# Patient Record
Sex: Female | Born: 1970 | Race: Black or African American | Hispanic: No | Marital: Married | Smoking: Never smoker
Health system: Southern US, Community
[De-identification: ages and names within clinical notes are randomized; demographics above are authoritative.]

## PROBLEM LIST (undated history)

## (undated) DIAGNOSIS — I2699 Other pulmonary embolism without acute cor pulmonale: Secondary | ICD-10-CM

## (undated) DIAGNOSIS — M7730 Calcaneal spur, unspecified foot: Secondary | ICD-10-CM

## (undated) HISTORY — PX: OTHER SURGICAL HISTORY: SHX169

## (undated) HISTORY — DX: Other pulmonary embolism without acute cor pulmonale: I26.99

## (undated) HISTORY — DX: Calcaneal spur, unspecified foot: M77.30

---

## 2009-06-01 ENCOUNTER — Ambulatory Visit: Payer: Self-pay | Admitting: Family Medicine

## 2009-06-01 ENCOUNTER — Other Ambulatory Visit: Admission: RE | Admit: 2009-06-01 | Discharge: 2009-06-01 | Payer: Self-pay | Admitting: Family Medicine

## 2009-06-02 ENCOUNTER — Encounter: Payer: Self-pay | Admitting: Family Medicine

## 2009-06-02 ENCOUNTER — Telehealth (INDEPENDENT_AMBULATORY_CARE_PROVIDER_SITE_OTHER): Payer: Self-pay | Admitting: *Deleted

## 2009-06-02 LAB — CONVERTED CEMR LAB
Trich, Wet Prep: NONE SEEN
WBC, Wet Prep HPF POC: NONE SEEN

## 2009-06-26 ENCOUNTER — Encounter: Payer: Self-pay | Admitting: Family Medicine

## 2009-06-28 LAB — CONVERTED CEMR LAB
CO2: 23 meq/L (ref 19–32)
Calcium: 9 mg/dL (ref 8.4–10.5)
Chloride: 106 meq/L (ref 96–112)
Cholesterol: 170 mg/dL (ref 0–200)
Glucose, Bld: 87 mg/dL (ref 70–99)
HCT: 38 % (ref 36.0–46.0)
MCV: 98.4 fL (ref 78.0–100.0)
RBC: 3.86 M/uL — ABNORMAL LOW (ref 3.87–5.11)
Sodium: 139 meq/L (ref 135–145)
Total Bilirubin: 0.5 mg/dL (ref 0.3–1.2)
Total Protein: 7 g/dL (ref 6.0–8.3)
Triglycerides: 67 mg/dL (ref <150)
VLDL: 13 mg/dL (ref 0–40)
WBC: 4.5 10*3/uL (ref 4.0–10.5)

## 2010-03-16 ENCOUNTER — Encounter: Admission: RE | Admit: 2010-03-16 | Discharge: 2010-03-16 | Payer: Self-pay | Admitting: Family Medicine

## 2010-03-16 ENCOUNTER — Ambulatory Visit: Payer: Self-pay | Admitting: Family Medicine

## 2010-03-16 ENCOUNTER — Telehealth: Payer: Self-pay | Admitting: Family Medicine

## 2010-03-16 DIAGNOSIS — M79609 Pain in unspecified limb: Secondary | ICD-10-CM

## 2010-03-16 DIAGNOSIS — M25569 Pain in unspecified knee: Secondary | ICD-10-CM | POA: Insufficient documentation

## 2010-03-17 ENCOUNTER — Encounter: Payer: Self-pay | Admitting: Family Medicine

## 2010-03-21 LAB — CONVERTED CEMR LAB
Clue Cells Wet Prep HPF POC: NONE SEEN
Trich, Wet Prep: NONE SEEN
Yeast Wet Prep HPF POC: NONE SEEN

## 2010-03-29 ENCOUNTER — Telehealth: Payer: Self-pay | Admitting: Family Medicine

## 2010-04-02 ENCOUNTER — Ambulatory Visit: Payer: Self-pay | Admitting: Family Medicine

## 2010-05-25 NOTE — Progress Notes (Signed)
Summary: Rx. Question  Phone Note Call from Patient   Caller: Patient Summary of Call: Candace Greene is is at the pharmacy and her medicine did not fall under the 4$ plan at  [facility] so Candace Greene is wondering if there is something cheaper she can have? Call back number [phone] Initial call taken by: Michaelle Copas,  March 16, 2010 2:43 PM  Follow-up for Phone Call        WE can try diclofenac.  Follow-up by: Nani Gasser MD,  March 16, 2010 3:00 PM  Additional Follow-up for Phone Call Additional follow up Details #1::        Candace Greene notified Additional Follow-up by: Avon Gully CMA, Duncan Dull),  March 17, 2010 8:42 AM    New/Updated Medications: DICLOFENAC SODIUM 75 MG TBEC (DICLOFENAC SODIUM) Take 1 tablet by mouth two times a day as needed for pain Prescriptions: DICLOFENAC SODIUM 75 MG TBEC (DICLOFENAC SODIUM) Take 1 tablet by mouth two times a day as needed for pain  #60 x 0   Entered and Authorized by:   Nani Gasser MD   Signed by:   Nani Gasser MD on 03/16/2010   Method used:   Electronically to        [facility] [phone]* (retail)       [address] [place], [place]  [address]       Ph: [phone]       Fax: [phone]   RxID:   [id number]

## 2010-05-25 NOTE — Progress Notes (Signed)
Summary: Vag itcing and knee pain  Phone Note Call from Patient   Caller: Patient Call For: Nani Gasser MD Summary of Call: pt called and states she is still having some vaginal itching and still has some knee pain. please advise Initial call taken by: Avon Gully CMA, Duncan Dull),  March 29, 2010 4:52 PM  Follow-up for Phone Call        She is welcome to come back in for repeat wet prep ([facility] hasn't used OTC meds for Yeast).  For her knee we can refer to ortho since not better and the xray was ok. Let me know if wants to do ortho.  Follow-up by: Nani Gasser MD,  March 29, 2010 5:00 PM  Additional Follow-up for Phone Call Additional follow up Details #1::        pt notified and wants referral to ortho. will schedule a nv Additional Follow-up by: Avon Gully CMA, Duncan Dull),  March 30, 2010 8:57 AM

## 2010-05-25 NOTE — Progress Notes (Signed)
Summary: pt uses [facility]  Phone Note Outgoing Call   Call placed by: Kandice Hams,  June 02, 2009 12:42 PM Call placed to: Patient Summary of Call: [facility] uses [facility] does not matter gel or tablet just as long as generic  [facility] Initial call taken by: Kandice Hams,  June 02, 2009 12:43 PM  Follow-up for Phone Call        Already sent.  Follow-up by: Nani Gasser MD,  June 02, 2009 12:47 PM

## 2010-05-25 NOTE — Assessment & Plan Note (Signed)
Summary: NOV:CPE w/ pap   Vital Signs:  Patient profile:   40 year old female Weight:      186.75 pounds Temp:     98.2 degrees F oral Pulse rate:   62 / minute BP sitting:   106 / 69  Vitals Entered By: Kandice Hams (June 01, 2009 3:37 PM) CC: new pt cpx with pap   Primary Care Provider:  Nani Gasser, MD  CC:  new pt cpx with pap.  History of Present Illness: Has been years since her last pap smear. Ran out of insurance so hasn't been in a long time.  Never had an abnormal one.  Currently sexually active. +hx for STD.  Has had some weight gain over the last year.   Was on ABX about a month ago and feels has had a low grade yeast infection with d/c adn ithcing since then Tried some OTC treatment but not sure it really worked. No abdominal pain.   Habits & Providers  Alcohol-Tobacco-Diet     Alcohol drinks/day: 0     Tobacco Status: never  Exercise-Depression-Behavior     Does Patient Exercise: no     Drug Use: no  Allergies (verified): No Known Drug Allergies  Past History:  Past Medical History: Right heel spur.    Past Surgical History: None  Family History: Family History Other cancer grandparents Family History High cholesterol grandparents Family History of Stroke F 1st degree relative <60 father Father w/ stroke  Social History: Occupation: Statistician.  Married Never Smoked Alcohol use-no Drug use-no Regular exercise-no 10 caffeinated drinks per day.  Smoking Status:  never Occupation:  employed Drug Use:  no Does Patient Exercise:  no  Review of Systems       No fever/sweats/weakness, + unexplained weight loss/gain.  No vison changes.  No difficulty hearing/ringing in ears, hay fever/allergies.  + chest pain/discomfort, no palpitations.  + Br lump/nipple discharge.  + cough/wheeze.  + blood in BM, no nausea/vomiting/diarrhea.  No nighttime urination, leaking urine, unusual vaginal bleeding, + discharge (penis or vagina).  No  muscle/joint pain. No rash, change in mole.  + HA, memory loss.  No anxiety, sleep d/o, depression.  No easy bruising/bleeding, unexplained lump. + concern with sexual function.   Physical Exam  General:  Well-developed,well-nourished,in no acute distress; alert,appropriate and cooperative throughout examination Head:  Normocephalic and atraumatic without obvious abnormalities. No apparent alopecia or balding. Eyes:  No corneal or conjunctival inflammation noted. EOMI. Perrla. Ears:  External ear exam shows no significant lesions or deformities.  Otoscopic examination reveals clear canals, tympanic membranes are intact bilaterally without bulging, retraction, inflammation or discharge. Hearing is grossly normal bilaterally. Nose:  External nasal examination shows no deformity or inflammation. Nasal mucosa are pink and moist without lesions or exudates. Mouth:  Oral mucosa and oropharynx without lesions or exudates.  Teeth in good repair. Neck:  No deformities, masses, or tenderness noted. Chest Wall:  No deformities, masses, or tenderness noted. Breasts:  No mass, nodules, thickening, tenderness, bulging, retraction, inflamation, nipple discharge or skin changes noted.   Lungs:  Normal respiratory effort, chest expands symmetrically. Lungs are clear to auscultation, no crackles or wheezes. Heart:  Normal rate and regular rhythm. S1 and S2 normal without gallop, murmur, click, rub or other extra sounds. Abdomen:  Bowel sounds positive,abdomen soft and non-tender without masses, organomegaly or hernias noted. Genitalia:  Normal introitus for age, no external lesions, no vaginal discharge, mucosa pink and moist, no vaginal or cervical  lesions, no vaginal atrophy, no friaility or hemorrhage, normal uterus size and position, no adnexal masses or tenderness Msk:  No deformity or scoliosis noted of thoracic or lumbar spine.   Pulses:  R and L carotid,radial,femoral,dorsalis pedis and posterior tibial  pulses are full and equal bilaterally Extremities:  No clubbing, cyanosis, edema, or deformity noted with normal full range of motion of all joints.   Neurologic:  No cranial nerve deficits noted. Station and gait are normal.Sensory, motor and coordinative functions appear intact. Skin:  no rashes.   Cervical Nodes:  No lymphadenopathy noted Psych:  Cognition and judgment appear intact. Alert and cooperative with normal attention span and concentration. No apparent delusions, illusions, hallucinations   Impression & Recommendations:  Problem # 1:  ROUTINE GYNECOLOGICAL EXAMINATION (ICD-V72.31)  Exam was normal. Due for screenig labs. Will send wet prep for vaginitis sxs.  Not sure about tetanus and flu shot. Wants to call her insurance first to make sure covered.  Did have a single CP episode so will check thyroid gland  and rule out anemia.  Orders: T-Comprehensive Metabolic Panel [id number]) T-Lipid Profile [id number]) T-TSH [id number]) T-CBC No Diff ([id number]) T-HIV Antibody  (Reflex) [id number]) T-RPR (Syphilis) [id number])  Other Orders: T-Wet Prep for Christoper Allegra, [facility] [id number])

## 2010-05-25 NOTE — Assessment & Plan Note (Signed)
Summary: knee pain,toe pain   Vital Signs:  Patient profile:   40 year old female Weight:      192 pounds Pulse rate:   83 / minute BP sitting:   110 / 58  (right arm) Cuff size:   regular  Vitals Entered By: Avon Gully CMA, Duncan Dull) (March 16, 2010 1:13 PM) CC: rt leg numb    Primary Care Provider:  Nani Gasser, MD  CC:  rt leg numb .  History of Present Illness: Pain started about a week ago.  Numbness adn pain around her knee adn down into her calf. Also some swelling in her right ankle. No injuries to her knee.  No swelling in the knee.  Noticed a knot on the top of her right foot about a week ago. Last night felt into into her upper thigh. Felt better propped up.  Occ worse with standing on it. Felt some better with massage.  Taking some BCs powders for headaches.  ONe day skipped meal. Tried elevating her knee on a pillow Her podiatrist is Dr. Karle Barr.   Current Medications (verified): 1)  None  Allergies (verified): No Known Drug Allergies  Comments:  Nurse/Medical Assistant: The patient's medications and allergies were reviewed with the patient and were updated in the Medication and Allergy Lists. Avon Gully CMA, Duncan Dull) (March 16, 2010 1:14 PM)  Physical Exam  General:  Well-developed,well-nourished,in no acute distress; alert,appropriate and cooperative throughout examination Msk:  Rightn knee with no edema. Noraml ROM. No laxity. Neg [place].  No crepitus.  No pain with valgus or varus.  Does have trace edema around her right ankle. Has what looks and feels like a bunion on the top of her great toe on her right foot.  ankle with NROM and strength 5/5. tendernss along the lateral joint line of the knee.    Impression & Recommendations:  Problem # 1:  KNEE PAIN (ICD-719.46) Assessment New Will get  xrays to rule out any major injury. at this point is an unclear etiology.  Her history is somewhat confusing.  The swelling and maybe coming  from her knee but I'll said it might be from the site itself as she clearly has had multiple foot problems in the past.  She has also gained some weight which may be contributing.  She also stands on her feet all day for her two jobs.  In the meantime she can certainly use of anti-inflammatory piroxicam for pain relief.  If she is not noticing improvement in the next two to 3 weeks, assuming her x-ray is normal, then will evaluate further. Her updated medication list for this problem includes:    Piroxicam 10 Mg Caps (Piroxicam) .Marland Kitchen... Take 1 tablet by mouth two times a day for 10 days  Orders: T-DG Knee 2 Views*R* ([id number])  Problem # 2:  TOE PAIN (ICD-729.5) Assessment: New the lesion does appear to be some type of bunion dilated from the top of the toe.  Will get the x-rays.  She may need a follow-up with Dr. Kathalene Frames depending on the x-ray results. Orders: T-DG Foot 2 Views*R* ([id number])  Complete Medication List: 1)  Piroxicam 10 Mg Caps (Piroxicam) .... Take 1 tablet by mouth two times a day for 10 days  Patient Instructions: 1)  Elevated and ice your knee.  2)  Can use the piroxicam for pain relief.   3)  We will call you with the xray results.  Prescriptions: PIROXICAM 10 MG CAPS (PIROXICAM) Take 1  tablet by mouth two times a day for 10 days  #60 x 0   Entered and Authorized by:   Nani Gasser MD   Signed by:   Nani Gasser MD on 03/16/2010   Method used:   Electronically to        [facility] [phone]* (retail)       [address] [place], [place]  [address]       Ph: [phone]       Fax: [phone]   RxID:   [id number]    Orders Added: 1)  T-DG Knee 2 Views*R* [[id number]] 2)  T-DG Foot 2 Views*R* [73620] 3)  Est. Patient Level IV [51884]

## 2010-08-11 ENCOUNTER — Encounter: Payer: Self-pay | Admitting: Family Medicine

## 2010-08-12 ENCOUNTER — Ambulatory Visit: Payer: Self-pay | Admitting: Family Medicine

## 2010-08-27 ENCOUNTER — Ambulatory Visit (INDEPENDENT_AMBULATORY_CARE_PROVIDER_SITE_OTHER): Payer: Self-pay | Admitting: Family Medicine

## 2010-08-27 ENCOUNTER — Encounter: Payer: Self-pay | Admitting: Family Medicine

## 2010-08-27 DIAGNOSIS — N898 Other specified noninflammatory disorders of vagina: Secondary | ICD-10-CM

## 2010-08-27 NOTE — Progress Notes (Signed)
  Subjective:    Patient ID: Candace Greene, female    DOB: [birth year], 40 y.o.   MRN: [id number]  HPI  She is here today because she noticed a bump on her right inner labia. It started approximately 5 days ago. She did develop a little ground ball. There was no active drainage or redness at that time but she did try taking it in Epsom salts and had been applying alcohol to look with water to the area. Now she just notices it looks red and raw is very tender to the touch. She's had a cyst internally in her vaginal wall previously and was worried that this might be recurring. She denies any new sexual partners and does not feel like this to be an STD. She denies any change in vaginal discharge. It stings if urine touches the area. No alleviating symptoms. She has been using some Vaseline on it as well.  Review of Systems     Objective:   Physical Exam    on the right inner labia she has a small approximately half a centimeter erythematous circular area. It doesn't look with a classic ulceration like herpes virus it really just looks like a ball irritated area on the skin. There is no active drainage or surrounding erythema. I do not palpate a cyst under the area.    Assessment & Plan:  Vaginal lesion-we discussed that at this point I think it would be best if she use a barrier ointment like Vaseline or Aquaphor up to 3 times a day to help protect the area. I recommended she discontinued any alcohol, peroxide or Epsom salts soaks soaks. I think these products have just irritated the area. Also she can go back to using her regular job and just gently wash the area during her shower. No other additional treatment. It is not better by Monday asked her to call the office and we can consider a trial of an antibiotic. I did not swab the area for viral culture as it did not quite appear to be consistent with herpes.

## 2010-09-07 ENCOUNTER — Telehealth: Payer: Self-pay | Admitting: Family Medicine

## 2010-09-07 NOTE — Telephone Encounter (Signed)
Yes to ED or [facility]. Pt notified of instructions.

## 2010-09-07 NOTE — Telephone Encounter (Signed)
Pt called and was at work today and had to leave because started menstrual cycle and saturated pad in 5 mins(long maxi pad) and she felt abnormal in vaginal area at that time because she also passed a clot the size of her hand.  Pt denied pain at that time.  Does not see OB-GYN.  Comes to Dr. Linford Arnold.  Now she is at home and has placed 2 pads on and feels they already need to be changed.  Feels like flowing water.  Please advise if pt needs to go to the ED. Plan:  Routed to [facility], LPN Domingo Dimes

## 2011-02-23 ENCOUNTER — Telehealth: Payer: Self-pay | Admitting: Family Medicine

## 2011-02-23 NOTE — Telephone Encounter (Signed)
Pt called and needs to schedule hospital follow up.  Pt said she was seen at the hosp and diagnosed with pneumonia. Plan:  Notified the pt on her home #.  Had to [facility] for her and told her to call the office back and speak with the triage nurse to get hosp fup 30 min appt scheduled. Jarvis Newcomer, LPN Domingo Dimes

## 2011-03-04 ENCOUNTER — Ambulatory Visit (INDEPENDENT_AMBULATORY_CARE_PROVIDER_SITE_OTHER): Payer: BC Managed Care – PPO | Admitting: Family Medicine

## 2011-03-04 ENCOUNTER — Encounter: Payer: Self-pay | Admitting: Family Medicine

## 2011-03-04 ENCOUNTER — Ambulatory Visit
Admission: RE | Admit: 2011-03-04 | Discharge: 2011-03-04 | Disposition: A | Discharge status: Home / Self Care | Payer: BC Managed Care – PPO | Source: Ambulatory Visit | Attending: Family Medicine | Admitting: Family Medicine

## 2011-03-04 ENCOUNTER — Other Ambulatory Visit: Payer: Self-pay | Admitting: Family Medicine

## 2011-03-04 VITALS — BP 90/55 | HR 68 | Temp 98.4°F | Ht 61.0 in | Wt 176.0 lb

## 2011-03-04 DIAGNOSIS — R209 Unspecified disturbances of skin sensation: Secondary | ICD-10-CM

## 2011-03-04 DIAGNOSIS — J189 Pneumonia, unspecified organism: Secondary | ICD-10-CM

## 2011-03-04 DIAGNOSIS — R202 Paresthesia of skin: Secondary | ICD-10-CM

## 2011-03-04 NOTE — Patient Instructions (Addendum)
Call if not better in one week We will call you with an xray. Stay hydrated.

## 2011-03-04 NOTE — Progress Notes (Signed)
Subjective:    Patient ID: Candace Greene, female    DOB: [birth year], 40 y.o.   MRN: [id number]  HPI Went to [facility] and dx with PNA. Written out of work for 3 days. First time went had a zpack and cough syrup and didn't really get better. Then when went to [facility] ED  On 02/22/11 and had xray thast showed PNA persisted. They put her on Avelox. Just completed course.  Says her cough is better but now her back is hurting. Has been 3 days since the dose.  Says Still feels SOB. She is not having any weakness but says she has noticed that her feet and hands will go numb. Not all at the same time.  May just be one hand or one foot. Will last for a few mintues. She moves it around and that helps. Has noticed it will sitting dow and when lying down.No rash.  She works in a very cold environment at work. Works at [employer]. Says has been trying to hydrate really well.     Review of Systems     BP 90/55  Pulse 68  Temp(Src) 98.4 F (36.9 C) (Oral)  Ht 5\' 1"  (1.549 m)  Wt 176 lb (79.833 kg)  BMI 33.25 kg/m2  SpO2 96%  LMP 02/11/2011    No Known Allergies  Past Medical History  Diagnosis Date  . Heel spur     right    No past surgical history on file.  History   Social History  . Marital Status: Married    Spouse Name: N/A    Number of Children: N/A  . Years of Education: N/A   Occupational History  . Not on file.   Social History Main Topics  . Smoking status: Never Smoker   . Smokeless tobacco: Not on file  . Alcohol Use: No  . Drug Use: No  . Sexually Active:    Other Topics Concern  . Not on file   Social History Narrative  . No narrative on file    Family History  Problem Relation Age of Onset  . Stroke Father     <60  . Birth defects Other   . Hyperlipidemia Other     @medscurrent    Objective:   Physical Exam  Constitutional: She is oriented to person, place, and time. She appears well-developed and well-nourished.  HENT:  Head: Normocephalic and  atraumatic.  Right Ear: External ear normal.  Left Ear: External ear normal.  Nose: Nose normal.  Mouth/Throat: Oropharynx is clear and moist.       TMs and canals are clear.   Eyes: Conjunctivae and EOM are normal. Pupils are equal, round, and reactive to light.  Neck: Neck supple. No thyromegaly present.  Cardiovascular: Normal rate, regular rhythm and normal heart sounds.   Pulmonary/Chest: Effort normal and breath sounds normal. She has no wheezes.  Lymphadenopathy:    She has no cervical adenopathy.  Neurological: She is alert and oriented to person, place, and time.  Skin: Skin is warm and dry.  Psychiatric: She has a normal mood and affect.          Assessment & Plan:  Pneumonia - Repeat CXR. I am concerned about her back pain (between the shoulder blades that is new) but this may be from coughing as well. She is overall much better so I don't think we need to extend her abx for now.  BP is a little low even for her. Encouraged  her to really try to hydrate well. Says at work they only get a break after 4 hours.    Paresthesias - Since has been sick has been having numbness and in her hands and feet.  If suddenly gets worse go to ED. Consider could be something like early [place]. Will check labs to rule out thyroid d/o, or electrolyte abnormality and will check white count as well.

## 2011-03-05 LAB — COMPLETE METABOLIC PANEL WITH GFR
ALT: 9 U/L (ref 0–35)
Alkaline Phosphatase: 43 U/L (ref 39–117)
CO2: 23 mEq/L (ref 19–32)
Creat: 1.2 mg/dL — ABNORMAL HIGH (ref 0.50–1.10)
GFR, Est African American: 65 mL/min — ABNORMAL LOW (ref >89)
GFR, Est Non African American: 57 mL/min — ABNORMAL LOW (ref >89)
Sodium: 140 mEq/L (ref 135–145)
Total Bilirubin: 0.6 mg/dL (ref 0.3–1.2)
Total Protein: 7 g/dL (ref 6.0–8.3)

## 2011-03-05 LAB — CBC WITH DIFFERENTIAL/PLATELET
Basophils Absolute: 0 10*3/uL (ref 0.0–0.1)
Basophils Relative: 0 % (ref 0–1)
Eosinophils Absolute: 0.3 10*3/uL (ref 0.0–0.7)
Eosinophils Relative: 4 % (ref 0–5)
MCH: 30.2 pg (ref 26.0–34.0)
MCHC: 32.8 g/dL (ref 30.0–36.0)
MCV: 92.2 fL (ref 78.0–100.0)
Neutrophils Relative %: 40 % — ABNORMAL LOW (ref 43–77)
Platelets: 283 10*3/uL (ref 150–400)
RBC: 3.74 MIL/uL — ABNORMAL LOW (ref 3.87–5.11)
RDW: 15.8 % — ABNORMAL HIGH (ref 11.5–15.5)

## 2011-03-08 LAB — IRON: Iron: 79 ug/dL (ref 42–145)

## 2011-04-14 ENCOUNTER — Ambulatory Visit (INDEPENDENT_AMBULATORY_CARE_PROVIDER_SITE_OTHER): Payer: BC Managed Care – PPO | Admitting: Family Medicine

## 2011-04-14 ENCOUNTER — Encounter: Payer: Self-pay | Admitting: Family Medicine

## 2011-04-14 VITALS — BP 112/65 | HR 57 | Temp 98.3°F | Ht 64.0 in | Wt 174.0 lb

## 2011-04-14 DIAGNOSIS — J0100 Acute maxillary sinusitis, unspecified: Secondary | ICD-10-CM

## 2011-04-14 DIAGNOSIS — Z23 Encounter for immunization: Secondary | ICD-10-CM

## 2011-04-14 LAB — POCT RAPID STREP A (OFFICE): Rapid Strep A Screen: NEGATIVE

## 2011-04-14 MED ORDER — FEXOFENADINE-PSEUDOEPHED ER 180-240 MG PO TB24: 1.0000 | ORAL_TABLET | Freq: Every day | ORAL | Status: DC

## 2011-04-14 MED ORDER — AMOXICILLIN-POT CLAVULANATE 875-125 MG PO TABS: 1.0000 | ORAL_TABLET | Freq: Two times a day (BID) | ORAL | Status: AC

## 2011-04-14 MED ORDER — HYDROCOD POLST-CHLORPHEN POLST 10-8 MG/5ML PO LQCR: 5.0000 mL | Freq: Two times a day (BID) | ORAL | Status: DC | PRN

## 2011-04-14 NOTE — Progress Notes (Signed)
  Subjective:    Patient ID: Candace Greene, female    DOB: [birth year], 40 y.o.   MRN: [id number]  Sore Throat  This is a new problem. The current episode started 1 to 4 weeks ago. The problem has been gradually worsening. Maximum temperature: not checked. Associated symptoms include congestion, coughing, ear pain and neck pain. Pertinent negatives include no shortness of breath. She has had no exposure to strep or mono. The treatment provided no relief.  Cough The current episode started more than 1 month ago. The cough is non-productive. Associated symptoms include ear pain. Pertinent negatives include no shortness of breath. The symptoms are aggravated by lying down. The treatment provided no relief. Her past medical history is significant for pneumonia.   Need for flu vaccination   Review of Systems  HENT: Positive for ear pain, congestion and neck pain.   Respiratory: Positive for cough. Negative for shortness of breath.   All other systems reviewed and are negative.      BP 112/65  Pulse 57  Temp(Src) 98.3 F (36.8 C) (Oral)  Ht 5\' 4"  (1.626 m)  Wt 174 lb (78.926 kg)  BMI 29.87 kg/m2  SpO2 97%  LMP 04/03/2011  Objective:   Physical Exam  Nursing note and vitals reviewed. Constitutional: She is oriented to person, place, and time. She appears well-developed and well-nourished. No distress.  HENT:  Head: Normocephalic and atraumatic.  Right Ear: Tympanic membrane, external ear and ear canal normal.  Left Ear: Tympanic membrane, external ear and ear canal normal.  Nose: Mucosal edema and rhinorrhea present. Right sinus exhibits maxillary sinus tenderness. Left sinus exhibits maxillary sinus tenderness.  Mouth/Throat: Oropharynx is clear and moist. No oral lesions. No oropharyngeal exudate.  Eyes: Right eye exhibits no discharge. Left eye exhibits no discharge. No scleral icterus.  Neck: Neck supple.  Cardiovascular: Normal rate, regular rhythm and normal heart sounds.     Pulmonary/Chest: Effort normal and breath sounds normal. She has no wheezes. She has no rales.  Lymphadenopathy:    She has cervical adenopathy.  Neurological: She is alert and oriented to person, place, and time.  Skin: Skin is warm and dry.          Assessment & Plan:  Sinusitis URI Augmentin 875 one tablet twice a day Allegra-D one tablet daily and if affordable Tussionex 1 teaspoon twice a day for the cough Work note given for the next 3 days reassured patient that the pneumonia appears to be resolved. Flu vaccination given

## 2011-04-14 NOTE — Patient Instructions (Signed)

## 2011-06-02 ENCOUNTER — Ambulatory Visit: Payer: BC Managed Care – PPO | Admitting: Family Medicine

## 2011-06-03 ENCOUNTER — Ambulatory Visit: Payer: BC Managed Care – PPO | Admitting: Physician Assistant

## 2011-06-03 DIAGNOSIS — Z0289 Encounter for other administrative examinations: Secondary | ICD-10-CM

## 2011-11-18 ENCOUNTER — Encounter: Payer: Self-pay | Admitting: Physician Assistant

## 2011-11-18 ENCOUNTER — Ambulatory Visit (INDEPENDENT_AMBULATORY_CARE_PROVIDER_SITE_OTHER): Payer: BC Managed Care – PPO | Admitting: Physician Assistant

## 2011-11-18 VITALS — BP 108/63 | HR 56 | Ht 64.0 in | Wt 183.0 lb

## 2011-11-18 DIAGNOSIS — R609 Edema, unspecified: Secondary | ICD-10-CM

## 2011-11-18 DIAGNOSIS — R51 Headache: Secondary | ICD-10-CM

## 2011-11-18 DIAGNOSIS — J329 Chronic sinusitis, unspecified: Secondary | ICD-10-CM

## 2011-11-18 DIAGNOSIS — R600 Localized edema: Secondary | ICD-10-CM

## 2011-11-18 MED ORDER — AMBULATORY NON FORMULARY MEDICATION: Status: DC

## 2011-11-18 MED ORDER — FLUCONAZOLE 150 MG PO TABS: 150.0000 mg | ORAL_TABLET | Freq: Once | ORAL | Status: AC

## 2011-11-18 MED ORDER — AMOXICILLIN-POT CLAVULANATE 875-125 MG PO TABS: 1.0000 | ORAL_TABLET | Freq: Two times a day (BID) | ORAL | Status: AC

## 2011-11-18 NOTE — Patient Instructions (Addendum)
Augementin for 10 days twice a day. Continue to use Mucinex. Gave Diflucan for yeast once after finishing abx. Compression stocking only when going to be on your feet a lot. Advil for headache. Ice head 20 min at a time. Call Monday if headaches not improving.  Venous Stasis and Chronic Venous Insufficiency As people age, the veins located in their legs may weaken and stretch. When veins weaken and lose the ability to pump blood effectively, the condition is called chronic venous insufficiency (CVI) or venous stasis. Almost all veins return blood back to the heart. This happens by:  The force of the heart pumping fresh blood pushes blood back to the heart.   Blood flowing to the heart from the force of gravity.  In the deep veins of the legs, blood has to fight gravity and flow upstream back to the heart. Here, the leg muscles contract to pump blood back toward the heart. Vein walls are elastic, and many veins have small valves that only allow blood to flow in one direction. When leg muscles contract, they push inward against the elastic vein walls. This squeezes blood upward, opens the valves, and moves blood toward the heart. When leg muscles relax, the vein wall also relaxes and the valves inside the vein close to prevent blood from flowing backward. This method of pumping blood out of the legs is called the venous pump. CAUSES  The venous pump works best while walking and leg muscles are contracting. But when a person sits or stands, blood pressure in leg veins can build. Deep veins are usually able to withstand short periods of inactivity, but long periods of inactivity (and increased pressure) can stretch, weaken, and damage vein walls. High blood pressure can also stretch and damage vein walls. The veins may no longer be able to pump blood back to the heart. Venous hypertension (high blood pressure inside veins) that lasts over time is a primary cause of CVI. CVI can also be caused by:   Deep vein  thrombosis, a condition where a thrombus (blood clot) blocks blood flow in a vein.   Phlebitis, an inflammation of a superficial vein that causes a blood clot to form.  Other risk factors for CVI may include:   Heredity.   Obesity.   Pregnancy.   Sedentary lifestyle.   Smoking.   Jobs requiring long periods of standing or sitting in one place.   Age and gender:   Women in their 80's and 72's and men in their 49's are more prone to developing CVI.  SYMPTOMS  Symptoms of CVI may include:   Varicose veins.   Ulceration or skin breakdown.   Lipodermatosclerosis, a condition that affects the skin just above the ankle, usually on the inside surface. Over time the skin becomes brown, smooth, tight and often painful. Those with this condition have a high risk of developing skin ulcers.   Reddened or discolored skin on the leg.   Swelling.  DIAGNOSIS  Your caregiver can diagnose CVI after performing a careful medical history and physical examination. To confirm the diagnosis, the following tests may also be ordered:   Duplex ultrasound.   Plethysmography (tests blood flow).   Venograms (x-ray using a special dye).  TREATMENT The goals of treatment for CVI are to restore a person to an active life and to minimize pain or disability. Typically, CVI does not pose a serious threat to life or limb, and with proper treatment most people with this condition can continue  to lead active lives. In most cases, mild CVI can be treated on an outpatient basis with simple procedures. Treatment methods include:   Elastic compression socks.   Sclerotherapy, a procedure involving an injection of a material that "dissolves" the damaged veins. Other veins in the network of blood vessels take over the function of the damaged veins.   Vein stripping (an older procedure less commonly used).   Laser Ablation surgery.   Valve repair.  HOME CARE INSTRUCTIONS   Elastic compression socks must be  worn every day. They can help with symptoms and lower the chances of the problem getting worse, but they do not cure the problem.   Only take over-the-counter or prescription medicines for pain, discomfort, or fever as directed by your caregiver.   Your caregiver will review your other medications with you.  SEEK MEDICAL CARE IF:   You are confused about how to take your medications.   There is redness, swelling, or increasing pain in the affected area.   There is a red streak or line that extends up or down from the affected area.   There is a breakdown or loss of skin in the affected area, even if the breakdown is small.   You develop an unexplained oral temperature above 102 F (38.9 C).   There is an injury to the affected area.  SEEK IMMEDIATE MEDICAL CARE IF:   There is an injury and open wound to the affected area.   Pain is not adequately relieved with pain medication prescribed or becomes severe.   An oral temperature above 102 F (38.9 C) develops.   The foot/ankle below the affected area becomes suddenly numb or the area feels weak and hard to move.  MAKE SURE YOU:   Understand these instructions.   Will watch your condition.   Will get help right away if you are not doing well or get worse.  Document Released: 08/15/2006 Document Revised: 03/31/2011 Document Reviewed: 10/23/2006 [facility] Patient Information 2012 [place], [place].

## 2011-11-18 NOTE — Progress Notes (Signed)
  Subjective:    Patient ID: Candace Greene, female    DOB: 1970-07-18, 41 y.o.   MRN: [id number]  HPI Pt presents to the clinic with a residual headache from hitting her head on the trunk 5 days ago. Her forehead hit the trunk. She went to ER on 3 days ago and they reassured her and did not do any scans. She was given Vicodin, Advil and muscle relaxer. It has helped some. Her headache is better the in the morning and worse as the day goes on. Pain eventually gets to a 10/10 at the end of the day. Her head hurts on the top. She denies any nausea or vomiting. She is very sensitive to light. Denies any changes in speech or weakness of extremities. She has had no vision blurriness. Denies any cough, fever, chills. She has had sinus pressure over a week.   She has had some ongoing swelling of both ankles on and off. Worse when she has been on them all day. She has done nothing for them to make better, She denies any pain or numbness/tingling of extremities.      Review of Systems     Objective:   Physical Exam  Constitutional: She is oriented to person, place, and time. She appears well-developed and well-nourished.  HENT:  Head: Normocephalic and atraumatic.  Right Ear: External ear normal.  Left Ear: External ear normal.  Nose: Nose normal.  Mouth/Throat: Oropharynx is clear and moist. No oropharyngeal exudate.       Head was normal where the trauma happened on forehead. There was some tenderness to palpation of the forehead along the hairline.   TM's normal. Maxillary and Frontal tenderness was noted bilaterally to palpation. Breath smelt very infectious.   Eyes: Conjunctivae and EOM are normal. Pupils are equal, round, and reactive to light.  Neck: Normal range of motion. Neck supple.  Cardiovascular: Normal rate, regular rhythm and normal heart sounds.   Pulmonary/Chest: Effort normal and breath sounds normal. She has no wheezes.  Lymphadenopathy:    She has cervical adenopathy.    Neurological: She is alert and oriented to person, place, and time. No cranial nerve deficit.  Skin: Skin is warm and dry.       1+ left ankle edema. No discoloration or skin changes. 2+pedal pulses. Capillary refill less than 3 seconds.  Psychiatric: She has a normal mood and affect. Her behavior is normal.          Assessment & Plan:  Headache/Sinusitis/yeast infection-Augementin for 10 days twice a day. Continue to use Mucinex. Gave Diflucan for yeast once after finishing abx. Continue to use Advil for headache. Ice head at a time. Call Monday if not improving and will scan head. It is unlikely that there is any bleed due to the time that has lapsed. Pt was given reassurance.  Bilateral leg edema- Reassured patient that swelling is due to venous statis. Encouraged to keep her feet elevated when been on them all day. Compression stocking only when going to be on your feet a lot. Watch salt intake.

## 2011-12-06 ENCOUNTER — Encounter: Payer: Self-pay | Admitting: Physician Assistant

## 2011-12-06 ENCOUNTER — Ambulatory Visit (INDEPENDENT_AMBULATORY_CARE_PROVIDER_SITE_OTHER): Payer: BC Managed Care – PPO | Admitting: Physician Assistant

## 2011-12-06 VITALS — BP 110/69 | HR 66 | Temp 98.0°F | Ht 64.0 in | Wt 180.0 lb

## 2011-12-06 DIAGNOSIS — R1030 Lower abdominal pain, unspecified: Secondary | ICD-10-CM

## 2011-12-06 DIAGNOSIS — R0981 Nasal congestion: Secondary | ICD-10-CM

## 2011-12-06 DIAGNOSIS — J36 Peritonsillar abscess: Secondary | ICD-10-CM

## 2011-12-06 DIAGNOSIS — J3489 Other specified disorders of nose and nasal sinuses: Secondary | ICD-10-CM

## 2011-12-06 DIAGNOSIS — R109 Unspecified abdominal pain: Secondary | ICD-10-CM

## 2011-12-06 NOTE — Progress Notes (Signed)
Subjective:    Patient ID: Candace Greene, female    DOB: 1970-06-02, 41 y.o.   MRN: [id number]  HPI Patient presents to the clinic to followup from ER visit on 8\11/13 where she was diagnosed with peritonsillar abscess. She was placed on clindamycin and Levaquin. She started the medication yesterday and has not even been 24 hours. She feels like her sore throat has gotten some better but her nasal congestion and drainage has not improved. She has not been using Mucinex. She does have some sinus pressure and is on and off again headache. She did recently have had trauma and has been having headaches since the trauma. She has been evaluated multiple times for head injury and everything has been normal. She denies any trouble or difficulty breathing. She has not had any fever, chills, muscle aches.  The main reason why she comes in today is an episode last night of intense lower abdominal cramping. She has not been able to have regular bowel movements. And when she does have a bowel movement they are hard. She feels fine today; however, last night for about 2 hours she had intense pain which she characterizes 7/10. She did nothing for pain and it did finally resolve on its own. She has been trying to drink more water. She denies any prescription pain medicine usage. She denies any nausea or vomiting. She has not had any acid reflux symptoms or chest pains. She cannot think of any church that made her lower abdomen her last night.   Review of Systems     Objective:   Physical Exam  Constitutional: She is oriented to person, place, and time. She appears well-developed and well-nourished.  HENT:  Head: Normocephalic and atraumatic.  Right Ear: External ear normal.  Left Ear: External ear normal.  Mouth/Throat: No oropharyngeal exudate.       Right TM was dull however ossicles were still able to be viewed. Left TM was normal. Negative for bilateral maxillary and frontal tenderness. Oropharynx was  erythematous and swollen; however, I was not able to the peritonsillar abscess. Per patient was only able to be seen in ER by CT scan. There was some postnasal drip running down the back of oropharynx. Bilateral turbinates were very red and swollen.  Eyes: Conjunctivae are normal.  Neck: Normal range of motion. Neck supple.       Right-sided anterior cervical adenopathy with tenderness to palpation.  Cardiovascular: Normal rate, regular rhythm and normal heart sounds.   Pulmonary/Chest: Effort normal and breath sounds normal. She has no wheezes.  Abdominal: Soft. Bowel sounds are normal.       Right lower quadrant there was tenderness to palpation over the area of the appendix. There was also tenderness to palpation over the left lower quadrant. No tenderness noted on the upper quadrant exam. No masses palpated.   Lymphadenopathy:    She has cervical adenopathy.  Neurological: She is alert and oriented to person, place, and time.  Skin: Skin is warm and dry.  Psychiatric: She has a normal mood and affect. Her behavior is normal.          Assessment & Plan:  Lower abdominal pain-I suspect that some of this pain might be coming from constipation and antibiotic usage. I gave patient handout on constipation. I encouraged her to drink lots of water to stay hydrated. I encouraged her to start using MiraLax twice a day and Colace 100 mg also twice a day. I encouraged the use of probiotics  while on these 2 antibiotics to help protect her stomach. My goal is to get her to have a normal bowel movement within 24 hours and see if that alleviates the episodes of intense abdominal pain. Patient was told that if she did have another episode of intense pain to call office and we could consider a CT of the abdomen. This presentation is not typical of an appendicitis because she is fine all day today; however, she does still have her appendix and want to make sure that there is no inflammation  there.  Peritonsillar abscess-it is only been 24-hour sense initiation of 2 antibiotics of clindamycin and Levaquin. Patient does report some relief of pain and no difficulty swallowing. The peritonsillar abscess was not able to be visualized on exam today. Per patient she states that it was only visible by CT scan. I do think she should be evaluated by a ENT and consider a tonsillectomy since she has had this abscess. I will refer her to ENT today. I reminded her to call office if she had any worsening symptoms, difficulty swallowing, her new onset fever.  Sinus pressure/nasal congestion-I do think that antibiotics the patient is starting on for an unrelated condition should be improving any bacterial agent that could close sinus pressure and nasal congestion. I did give a sample of Nasonex to use twice a day each nostril to help with nasal congestion. Encourage patient to continue using Mucinex daily. Encouraged patient to mention continual symptoms with ENT next week with followup of peritonsillar abscess.

## 2011-12-06 NOTE — Patient Instructions (Addendum)
Miralax 2 capules up to twice a day to help with constipation and increase fiber. Also can use colace 100mg  twice a day to help with bowel movements. Call if have another abdominal pain attack.   Probiotics might be a good idea to protect your stomach from all the antibiotics.   Will make referral for ENT.   STart Nasonex 2 sprays each nostril daily. Continue with Mucinex.    Fiber Content in Foods Drinking plenty of fluids and consuming foods high in fiber can help with constipation. See the list below for the fiber content of some common foods. Starches and Grains / Dietary Fiber (g)  Cheerios, 1 cup / 3 g   Kellogg's Corn Flakes, 1 cup / 0.7 g   Rice Krispies, 1  cup / 0.3 g   Quaker Oat Life Cereal,  cup / 2.1 g   Oatmeal, instant (cooked),  cup / 2 g   Kellogg's Frosted Mini Wheats, 1 cup / 5.1 g   Rice, brown, long-grain (cooked), 1 cup / 3.5 g   Rice, white, long-grain (cooked), 1 cup / 0.6 g   Macaroni, cooked, enriched, 1 cup / 2.5 g  Legumes / Dietary Fiber (g)  Beans, baked, canned, plain or vegetarian,  cup / 5.2 g   Beans, kidney, canned,  cup / 6.8 g   Beans, pinto, dried (cooked),  cup / 7.7 g   Beans, pinto, canned,  cup / 5.5 g  Breads and Crackers / Dietary Fiber (g)  Graham crackers, plain or honey, 2 squares / 0.7 g   Saltine crackers, 3 squares / 0.3 g   Pretzels, plain, salted, 10 pieces / 1.8 g   Bread, whole-wheat, 1 slice / 1.9 g   Bread, white, 1 slice / 0.7 g   Bread, raisin, 1 slice / 1.2 g   Bagel, plain, 3 oz / 2 g   Tortilla, flour, 1 oz / 0.9 g   Tortilla, corn, 1 small / 1.5 g   Bun, hamburger or hotdog, 1 small / 0.9 g  Fruits / Dietary Fiber (g)  Apple, raw with skin, 1 medium / 4.4 g   Applesauce, sweetened,  cup / 1.5 g   Banana,  medium / 1.5 g   Grapes, 10 grapes / 0.4 g   Orange, 1 small / 2.3 g   Raisin, 1.5 oz / 1.6 g   Melon, 1 cup / 1.4 g  Vegetables / Dietary Fiber (g)  Green beans,  canned,  cup / 1.3 g   Carrots (cooked),  cup / 2.3 g   Broccoli (cooked),  cup / 2.8 g   Peas, frozen (cooked),  cup / 4.4 g   Potatoes, mashed,  cup / 1.6 g   Lettuce, 1 cup / 0.5 g   Corn, canned,  cup / 1.6 g   Tomato,  cup / 1.1 g  Document Released: 08/28/2006 Document Revised: 03/31/2011 Document Reviewed: 10/23/2006 [facility] Patient Information 2012 [place], [place].  Constipation in Adults Constipation is having fewer than 2 bowel movements per week. Usually, the stools are hard. As we grow older, constipation is more common. If you try to fix constipation with laxatives, the problem may get worse. This is because laxatives taken over a long period of time make the colon muscles weaker. A low-fiber diet, not taking in enough fluids, and taking some medicines may make these problems worse. MEDICATIONS THAT MAY CAUSE CONSTIPATION  Water pills (diuretics).   Calcium channel blockers (used  to control blood pressure and for the heart).   Certain pain medicines (narcotics).   Anticholinergics.   Anti-inflammatory agents.   Antacids that contain aluminum.  DISEASES THAT CONTRIBUTE TO CONSTIPATION  Diabetes.   Parkinson's disease.   Dementia.   Stroke.   Depression.   Illnesses that cause problems with salt and water metabolism.  HOME CARE INSTRUCTIONS   Constipation is usually best cared for without medicines. Increasing dietary fiber and eating more fruits and vegetables is the best way to manage constipation.   Slowly increase fiber intake to 25 to 38 grams per day. Whole grains, fruits, vegetables, and legumes are good sources of fiber. A dietitian can further help you incorporate high-fiber foods into your diet.   Drink enough water and fluids to keep your urine clear or pale yellow.   A fiber supplement may be added to your diet if you cannot get enough fiber from foods.   Increasing your activities also helps improve regularity.   Suppositories,  as suggested by your caregiver, will also help. If you are using antacids, such as aluminum or calcium containing products, it will be helpful to switch to products containing magnesium if your caregiver says it is okay.   If you have been given a liquid injection (enema) today, this is only a temporary measure. It should not be relied on for treatment of longstanding (chronic) constipation.   Stronger measures, such as magnesium sulfate, should be avoided if possible. This may cause uncontrollable diarrhea. Using magnesium sulfate may not allow you time to make it to the bathroom.  SEEK IMMEDIATE MEDICAL CARE IF:   There is bright red blood in the stool.   The constipation stays for more than 4 days.   There is belly (abdominal) or rectal pain.   You do not seem to be getting better.   You have any questions or concerns.  MAKE SURE YOU:   Understand these instructions.   Will watch your condition.   Will get help right away if you are not doing well or get worse.  Document Released: 01/08/2004 Document Revised: 03/31/2011 Document Reviewed: 03/15/2011 [facility] Patient Information 2012 [place], [place].

## 2011-12-21 ENCOUNTER — Telehealth: Payer: Self-pay | Admitting: *Deleted

## 2011-12-21 NOTE — Telephone Encounter (Signed)
Pt called stating she was suppose to call you back if her abd pain returned. She states the pain started back this am and that she had to leave work b/c it is hurting so much. Please advise.

## 2011-12-21 NOTE — Telephone Encounter (Signed)
Patient c/o abd pain and will come in to see MD for tomorrow  no constipation, pain on right side

## 2011-12-22 ENCOUNTER — Ambulatory Visit: Payer: BC Managed Care – PPO | Admitting: Family Medicine

## 2011-12-23 ENCOUNTER — Ambulatory Visit: Payer: BC Managed Care – PPO | Admitting: Family Medicine

## 2012-02-23 ENCOUNTER — Ambulatory Visit (INDEPENDENT_AMBULATORY_CARE_PROVIDER_SITE_OTHER): Payer: BC Managed Care – PPO | Admitting: Sports Medicine

## 2012-02-23 VITALS — BP 119/79 | HR 53 | Wt 165.0 lb

## 2012-02-23 DIAGNOSIS — J069 Acute upper respiratory infection, unspecified: Secondary | ICD-10-CM

## 2012-02-23 MED ORDER — MELOXICAM 15 MG PO TABS: ORAL_TABLET | ORAL | Status: DC

## 2012-02-23 NOTE — Progress Notes (Signed)
Subjective:    CC: Sick  HPI:  Candace Greene has ben feeling sign for approximately a week and a half now. She notes significant rhinorrhea, with throat congestion, and mild soreness. She also has significant myalgias in her shoulder muscles. It is also associated fatigue. She denies sinus pressure, cough, ear pain, change in hearing. Symptoms are of moderate severity. She's has tried Mucinex, which was ineffective in controlling her symptoms.  Past medical history, Surgical history, Family history, Social history, Allergies, and medications have been entered into the medical record, reviewed, and no changes needed.   Review of Systems: No fevers, chills, night sweats, weight loss, chest pain, or shortness of breath.   Objective:    General: Well Developed, well nourished, and in no acute distress.  Neuro: Alert and oriented x3, extra-ocular muscles intact.  HEENT: Normocephalic, atraumatic, pupils equal round reactive to light, neck supple, no masses, no lymphadenopathy, thyroid nonpalpable. Nasopharynx shows boggy, erythematous nasal turbinate bones. Oropharynx shows some postnasal drip with mild erythema, there is no exudates. Skin: Warm and dry, no rashes. Cardiac: Regular rate and rhythm, no murmurs rubs or gallops.  Respiratory: Clear to auscultation bilaterally. Not using accessory muscles, speaking in full sentences.   Impression and Recommendations:

## 2012-02-23 NOTE — Assessment & Plan Note (Signed)
Viral URI with predominantly rhinorrhea. Mobic, over-the-counter decongestants. We will defer the flu shot until she's feeling better.

## 2012-02-23 NOTE — Patient Instructions (Addendum)
Use over the counter decongestants such as tylenol sinus. Mobic daily.

## 2012-03-15 ENCOUNTER — Encounter: Payer: Self-pay | Admitting: Family Medicine

## 2012-03-15 ENCOUNTER — Ambulatory Visit (INDEPENDENT_AMBULATORY_CARE_PROVIDER_SITE_OTHER): Payer: BC Managed Care – PPO | Admitting: Family Medicine

## 2012-03-15 VITALS — BP 119/77 | HR 52 | Ht 63.5 in | Wt 170.0 lb

## 2012-03-15 DIAGNOSIS — R930 Abnormal findings on diagnostic imaging of skull and head, not elsewhere classified: Secondary | ICD-10-CM

## 2012-03-15 DIAGNOSIS — S46819A Strain of other muscles, fascia and tendons at shoulder and upper arm level, unspecified arm, initial encounter: Secondary | ICD-10-CM

## 2012-03-15 DIAGNOSIS — Z23 Encounter for immunization: Secondary | ICD-10-CM

## 2012-03-15 MED ORDER — CYCLOBENZAPRINE HCL 10 MG PO TABS: 5.0000 mg | ORAL_TABLET | Freq: Every evening | ORAL | Status: DC | PRN

## 2012-03-15 NOTE — Progress Notes (Signed)
  Subjective:    Patient ID: Candace Greene, female    DOB: Sep 29, 1970, 41 y.o.   MRN: [id number]  HPI Candace Greene to ED at [facility] back in August they did a CT of her head. She got a letter in the mail couple days ago showing an updated read on her CT scan of her head. Should she had dissipated secretions or possibly a fungal infection in the sinuses.  Continue to have upper back pain bt the shoulder blades. Started mobic with no sig relief.  Says it burns at times. She saw Dr. Benjamin Stain for this a few weeks ago. It has really not gotten somewhat better. She is right-handed and her pain is worse on the right upper back. She says sometimes she hurts over her right hip as well.   Review of Systems     Objective:   Physical Exam  Constitutional: She appears well-developed and well-nourished.  Musculoskeletal:       Neck and shoulders w/ NROM.  Tender over both traps an over the right scapula.  Nontender over the thoracic spine  Skin: Skin is warm and dry.  Psychiatric: She has a normal mood and affect. Her behavior is normal.          Assessment & Plan:  Abnormal Head CT- at this point time I think that the images and throat specialist to review this. The CT said that she may have anticipated secretions or possibly a fungal infection the sinuses. 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  I will call and try to get a copy of her last ENT appointment. We have referred her several months ago she again for at least one visit but never went back. We will fax a copy of the CT report over to ENT and see if they would like to see her again.  Trap strain- Discussed dx.  Given H.O. On stretches.  Heat for 10-15 min at a time.  Trial of muscle relaxer at bedtime. If not better in 3 weeks then  Follow up. Also disscused formal PT as an option.

## 2012-03-15 NOTE — Patient Instructions (Signed)
Apply heat for 10-15 minutes twice a day as needed to your upper back Work on gentle stretches Can apply pressure at sore spot or have someone massage your shoulders.  Continue mobic as needed for pain and inflammation We can try muscle relaxer at bedtime as needed.

## 2012-03-16 ENCOUNTER — Telehealth: Payer: Self-pay | Admitting: Family Medicine

## 2012-03-16 ENCOUNTER — Encounter: Payer: Self-pay | Admitting: Family Medicine

## 2012-03-16 NOTE — Telephone Encounter (Addendum)
Please call patient and let her know that a copy of her office visit where she saw Dr. Earmon Phoenix Scurry back in August. He actually wanted to see her back in one month and he had planned on repeating her CT. Probably to see if that area of concern had cleared or not. She is to go ahead and make a followup appointment with him and take a copy of the additional CT report with her.

## 2012-03-19 ENCOUNTER — Telehealth: Payer: Self-pay | Admitting: *Deleted

## 2012-03-19 NOTE — Telephone Encounter (Signed)
See other message

## 2012-06-05 ENCOUNTER — Ambulatory Visit: Payer: BC Managed Care – PPO | Admitting: Family Medicine

## 2012-06-21 ENCOUNTER — Encounter: Payer: Self-pay | Admitting: Family Medicine

## 2012-06-21 ENCOUNTER — Ambulatory Visit (INDEPENDENT_AMBULATORY_CARE_PROVIDER_SITE_OTHER): Payer: BC Managed Care – PPO | Admitting: Family Medicine

## 2012-06-21 VITALS — BP 104/66 | HR 55 | Ht 61.0 in | Wt 175.0 lb

## 2012-06-21 MED ORDER — MOMETASONE FUROATE 50 MCG/ACT NA SUSP: 2.0000 | Freq: Every day | NASAL | Status: DC

## 2012-06-21 NOTE — Progress Notes (Signed)
  Subjective:    Patient ID: Candace Greene, female    DOB: 1970-11-14, 42 y.o.   MRN: [id number]  HPI She's here to followup on her sinus pain and pressure. She saw ENT him as to year ago and when I saw her back to review the information as well as a CT scan that she had. I had recommended she go back and see ENT since she was still having a fair amount of symptoms. I did put her on Nasonex and she actually felt it was extremely helpful she said after a while some of the mucus and phlegm started to clear and she has felt much better. She just took the samples and has not been on a continuous prescription since then and now she's starting to get recurrence of phlegm in her nose and throat and some sinus congestion and headaches.   Review of Systems     Objective:   Physical Exam  Constitutional: She is oriented to person, place, and time. She appears well-developed and well-nourished.  HENT:  Head: Normocephalic and atraumatic.  Right Ear: External ear normal.  Left Ear: External ear normal.  Nose: Nose normal.  Mouth/Throat: Oropharynx is clear and moist.  TMs and canals are clear.   Eyes: Conjunctivae and EOM are normal. Pupils are equal, round, and reactive to light.  Neck: Neck supple. No thyromegaly present.  Cardiovascular: Normal rate, regular rhythm and normal heart sounds.   Pulmonary/Chest: Effort normal and breath sounds normal. She has no wheezes.  Lymphadenopathy:    She has no cervical adenopathy.  Neurological: She is alert and oriented to person, place, and time.  Skin: Skin is warm and dry.  Psychiatric: She has a normal mood and affect.          Assessment & Plan:  Sinusitis-lets restart the Nasonex since it was extremely helpful. On it she has a component of allergic rhinitis or fits the cold environment that she works in in the [employer]. He airway I think the medication is been extremely helpful. I would still encourage her to get back in with ENT and to  reschedule that appointment sometime this spring. If she's not improving or suddenly gets worse then please call the office and consider a course of antibiotics at that time.

## 2012-11-09 ENCOUNTER — Encounter: Payer: BC Managed Care – PPO | Admitting: Family Medicine

## 2012-11-09 DIAGNOSIS — Z0289 Encounter for other administrative examinations: Secondary | ICD-10-CM

## 2012-11-19 ENCOUNTER — Telehealth: Payer: Self-pay | Admitting: Physician Assistant

## 2012-11-19 ENCOUNTER — Encounter: Payer: BC Managed Care – PPO | Admitting: Family Medicine

## 2012-11-19 ENCOUNTER — Ambulatory Visit (INDEPENDENT_AMBULATORY_CARE_PROVIDER_SITE_OTHER): Payer: BC Managed Care – PPO | Admitting: Physician Assistant

## 2012-11-19 ENCOUNTER — Encounter: Payer: Self-pay | Admitting: Physician Assistant

## 2012-11-19 VITALS — BP 110/72 | HR 71 | Temp 97.6°F | Wt 185.0 lb

## 2012-11-19 DIAGNOSIS — J019 Acute sinusitis, unspecified: Secondary | ICD-10-CM

## 2012-11-19 MED ORDER — FLUCONAZOLE 150 MG PO TABS: ORAL_TABLET | ORAL | Status: DC

## 2012-11-19 MED ORDER — MOMETASONE FUROATE 50 MCG/ACT NA SUSP: 2.0000 | Freq: Every day | NASAL | Status: DC

## 2012-11-19 MED ORDER — AMOXICILLIN-POT CLAVULANATE 875-125 MG PO TABS: 1.0000 | ORAL_TABLET | Freq: Two times a day (BID) | ORAL | Status: DC

## 2012-11-19 NOTE — Telephone Encounter (Signed)
I will give her diflucan to take one now and then one when finishes antibiotic.

## 2012-11-19 NOTE — Patient Instructions (Addendum)

## 2012-11-19 NOTE — Progress Notes (Signed)
  Subjective:    Patient ID: Candace Greene, female    DOB: July 31, 1970, 42 y.o.   MRN: [id number]  HPI Patient presents to the clinic with 1 and 1/2 weeks of cold like symptoms that seem to be getting worse. She has a lot of sinus pressure, bilateral ear pain, nasal congestion but also post nasal drip. HA off and on for past 3 days. She denies any fever, chills, ST, n/v/d, SOB, cough, wheezing. She has tried Mucinex DM for 4 days. She feels tired and worn out.   Review of Systems     Objective:   Physical Exam  Constitutional: She is oriented to person, place, and time. She appears well-developed and well-nourished.  HENT:  Head: Normocephalic and atraumatic.  Right Ear: External ear normal.  Left Ear: External ear normal.  TM's clear bilaterally.  Left sided maxillary tenderness to palpation.  Bilateral turbinates swollen and red.  Oropharynx erythematous but tonsils not swollen and no exudate.  Eyes: Conjunctivae are normal.  Neck: Normal range of motion. Neck supple.  Cardiovascular: Normal rate, regular rhythm and normal heart sounds.   Pulmonary/Chest: Effort normal and breath sounds normal. She has no wheezes.  Lymphadenopathy:    She has no cervical adenopathy.  Neurological: She is alert and oriented to person, place, and time.  Skin: Skin is warm and dry.  Psychiatric: She has a normal mood and affect. Her behavior is normal.          Assessment & Plan:  sinusitis Augmenting given for 10 days. Gave HO for symptomatic relief. Gave diflucan due to history of yeast infections after abx usage. Use nasonex for next couple of days. Continue mucinex DM. Call if not improving and could consider steroid to help with inflammation.

## 2012-11-19 NOTE — Telephone Encounter (Signed)
Patient called and request to know if she can have an antibiotic called into her pharmacy because the medicine you called in for her causes her to have a yeast infection. Thanks

## 2012-11-23 ENCOUNTER — Other Ambulatory Visit: Payer: Self-pay | Admitting: Physician Assistant

## 2012-11-23 ENCOUNTER — Telehealth: Payer: Self-pay | Admitting: *Deleted

## 2012-11-23 MED ORDER — PREDNISONE 50 MG PO TABS: ORAL_TABLET | ORAL | Status: DC

## 2012-11-23 NOTE — Telephone Encounter (Signed)
LMOM notifying pt of prednisone on # [phone]

## 2012-11-23 NOTE — Telephone Encounter (Signed)
Will send oral prednisone to add to abx.

## 2012-11-23 NOTE — Telephone Encounter (Signed)
Pt calls & states that she is still really congested & her back is still hurting her.  She was given abx on Monday.

## 2012-12-14 ENCOUNTER — Encounter: Payer: BC Managed Care – PPO | Admitting: Family Medicine

## 2012-12-14 DIAGNOSIS — Z0289 Encounter for other administrative examinations: Secondary | ICD-10-CM

## 2013-01-10 ENCOUNTER — Encounter: Payer: Self-pay | Admitting: *Deleted

## 2013-01-10 ENCOUNTER — Emergency Department (INDEPENDENT_AMBULATORY_CARE_PROVIDER_SITE_OTHER): Payer: BC Managed Care – PPO

## 2013-01-10 ENCOUNTER — Emergency Department (INDEPENDENT_AMBULATORY_CARE_PROVIDER_SITE_OTHER)
Admission: EM | Admit: 2013-01-10 | Discharge: 2013-01-10 | Disposition: A | Discharge status: Home / Self Care | Payer: BC Managed Care – PPO | Source: Home / Self Care | Attending: Family Medicine | Admitting: Family Medicine

## 2013-01-10 DIAGNOSIS — M25569 Pain in unspecified knee: Secondary | ICD-10-CM

## 2013-01-10 DIAGNOSIS — J069 Acute upper respiratory infection, unspecified: Secondary | ICD-10-CM

## 2013-01-10 DIAGNOSIS — M25561 Pain in right knee: Secondary | ICD-10-CM

## 2013-01-10 MED ORDER — MELOXICAM 15 MG PO TABS: ORAL_TABLET | ORAL | Status: DC

## 2013-01-10 NOTE — ED Provider Notes (Signed)
CSN: [id number]     Arrival date & time 01/10/13  1850 History   First MD Initiated Contact with Patient 01/10/13 1914     Chief Complaint  Patient presents with  . Knee Pain  . Leg Pain   (Consider location/radiation/quality/duration/timing/severity/associated sxs/prior Treatment) HPI Comments: Patient noticed vague mild pain in her right knee about a week ago without preceding trauma or change in activities.  The knee swells at times, and occasionally feels like it may give way.  She has mild pain with ambulation.  Ibuprofen 400mg  BID is somewhat helpful.  No past history of knee injury.  She notes that she weighed about 165 pounds several months ago.  Patient is a 42 y.o. female presenting with knee pain and leg pain. The history is provided by the patient.  Knee Pain Location:  Knee Time since incident:  1 week Injury: no   Knee location:  R knee Pain details:    Quality:  Aching   Radiates to:  Does not radiate   Severity:  Mild   Onset quality:  Gradual   Duration:  1 week   Timing:  Constant   Progression:  Unchanged Chronicity:  New Prior injury to area:  No Relieved by:  NSAIDs Worsened by:  Bearing weight Ineffective treatments:  None tried Associated symptoms: stiffness and swelling   Associated symptoms: no back pain, no decreased ROM, no fever, no muscle weakness, no numbness and no tingling   Risk factors: obesity   Leg Pain Associated symptoms: stiffness and swelling   Associated symptoms: no back pain, no decreased ROM, no fever, no muscle weakness, no numbness and no tingling     Past Medical History  Diagnosis Date  . Heel spur     right   History reviewed. No pertinent past surgical history. Family History  Problem Relation Age of Onset  . Stroke Father     <60  . Birth defects Other   . Hyperlipidemia Other    History  Substance Use Topics  . Smoking status: Never Smoker   . Smokeless tobacco: Never Used  . Alcohol Use: No   OB History   Grav Para Term Preterm Abortions TAB SAB Ect Mult Living                 Review of Systems  Constitutional: Negative for fever.  Musculoskeletal: Positive for stiffness. Negative for back pain.  All other systems reviewed and are negative.    Allergies  Review of patient's allergies indicates no known allergies.  Home Medications   Current Outpatient Rx  Name  Route  Sig  Dispense  Refill  . meloxicam (MOBIC) 15 MG tablet      One tab PO qAM with breakfast for 2 weeks   15 tablet   3   . mometasone (NASONEX) 50 MCG/ACT nasal spray   Nasal   Place 2 sprays into the nose daily.   17 g   6    BP 126/68  Pulse 55  Resp 14  Ht 5' 2.5" (1.588 m)  Wt 189 lb (85.73 kg)  BMI 34 kg/m2  SpO2 99%  LMP 01/03/2013 Physical Exam  Nursing note and vitals reviewed. Constitutional: She is oriented to person, place, and time. She appears well-developed and well-nourished. No distress.  Patient is obese (BMI 34)  HENT:  Head: Normocephalic.  Eyes: Conjunctivae are normal. Pupils are equal, round, and reactive to light.  Cardiovascular: Normal heart sounds.   Pulmonary/Chest: Breath sounds  normal.  Musculoskeletal:       Right knee: She exhibits normal range of motion, no swelling, no effusion, no ecchymosis, no deformity, no laceration, no erythema, normal alignment, no LCL laxity, normal patellar mobility, no bony tenderness, normal meniscus and no MCL laxity. Tenderness found. Medial joint line and lateral joint line tenderness noted. No patellar tendon tenderness noted.  McMurray test negative  Neurological: She is alert and oriented to person, place, and time.  Skin: Skin is warm and dry.    ED Course  Procedures  none    Imaging Review Dg Knee Complete 4 Views Right  01/10/2013   *RADIOLOGY REPORT*  Clinical Data: Right knee pain without injury.  RIGHT KNEE - COMPLETE 4+ VIEW  Comparison: None.  Findings: No fracture or dislocation is noted.  No joint effusion is noted.   Joint spaces are intact.  No soft tissue abnormality is noted.  IMPRESSION: Normal right knee.   Original Report Authenticated By: Lupita Raider.,  M.D.    MDM   1. Right knee pain; non-specific.  Patient has mild bilateral tenderness, somewhat worse medially, possibly mild MCL sprain        Knee sleeve applied.  Begin Mobic.  Wear knee sleeve daytime.  Apply ice pack 3 or 4 times daily for 2 to 3 days.  Begin range of motion exercises ([facility] information and instruction handout given)  Discussed weight loss Followup with [facility] if not improving about two weeks.   Lattie Haw, MD 01/10/13 2030

## 2013-01-10 NOTE — ED Notes (Signed)
Candace Greene reports right knee and leg pain x 1 week. She believes it may have started when she was stipping up a ladder @ work but cannot be sure. She is not filing workman's comp.

## 2013-05-02 ENCOUNTER — Encounter: Payer: BC Managed Care – PPO | Admitting: Family Medicine

## 2013-05-03 ENCOUNTER — Other Ambulatory Visit (HOSPITAL_COMMUNITY)
Admission: RE | Admit: 2013-05-03 | Discharge: 2013-05-03 | Disposition: A | Discharge status: Home / Self Care | Payer: BC Managed Care – PPO | Source: Ambulatory Visit | Attending: Family Medicine | Admitting: Family Medicine

## 2013-05-03 ENCOUNTER — Encounter: Payer: Self-pay | Admitting: Family Medicine

## 2013-05-03 ENCOUNTER — Ambulatory Visit (INDEPENDENT_AMBULATORY_CARE_PROVIDER_SITE_OTHER): Payer: BC Managed Care – PPO | Admitting: Family Medicine

## 2013-05-03 VITALS — BP 118/60 | HR 78 | Temp 97.5°F | Ht 64.0 in | Wt 190.0 lb

## 2013-05-03 DIAGNOSIS — Z23 Encounter for immunization: Secondary | ICD-10-CM

## 2013-05-03 DIAGNOSIS — Z0000 Encounter for general adult medical examination without abnormal findings: Secondary | ICD-10-CM

## 2013-05-03 DIAGNOSIS — B372 Candidiasis of skin and nail: Secondary | ICD-10-CM

## 2013-05-03 DIAGNOSIS — N760 Acute vaginitis: Secondary | ICD-10-CM | POA: Insufficient documentation

## 2013-05-03 DIAGNOSIS — Z113 Encounter for screening for infections with a predominantly sexual mode of transmission: Secondary | ICD-10-CM | POA: Insufficient documentation

## 2013-05-03 DIAGNOSIS — Z124 Encounter for screening for malignant neoplasm of cervix: Secondary | ICD-10-CM

## 2013-05-03 DIAGNOSIS — Z01419 Encounter for gynecological examination (general) (routine) without abnormal findings: Secondary | ICD-10-CM | POA: Insufficient documentation

## 2013-05-03 DIAGNOSIS — Z1151 Encounter for screening for human papillomavirus (HPV): Secondary | ICD-10-CM | POA: Insufficient documentation

## 2013-05-03 MED ORDER — MOMETASONE FUROATE 50 MCG/ACT NA SUSP: 2.0000 | Freq: Every day | NASAL | Status: DC

## 2013-05-03 MED ORDER — NYSTATIN 100000 UNIT/GM EX POWD: CUTANEOUS | Status: DC

## 2013-05-03 MED ORDER — MELOXICAM 15 MG PO TABS: ORAL_TABLET | ORAL | Status: DC

## 2013-05-03 NOTE — Patient Instructions (Signed)
Keep up a regular exercise program and make sure you are eating a healthy diet Try to eat 4 servings of dairy a day, or if you are lactose intolerant take a calcium with vitamin D daily.  Your vaccines are up to date.   

## 2013-05-03 NOTE — Addendum Note (Signed)
Addended by: Teddy Spike on: 05/03/2013 04:44 PM   Modules accepted: Orders

## 2013-05-03 NOTE — Progress Notes (Signed)
  Subjective:     Candace Greene is a 43 y.o. female and is here for a comprehensive physical exam. The patient reports problems - rash under both breast x 1 mo. burns and itchy.  Not put anything on it.    History   Social History  . Marital Status: Married    Spouse Name: N/A    Number of Children: N/A  . Years of Education: N/A   Occupational History  . Not on file.   Social History Main Topics  . Smoking status: Never Smoker   . Smokeless tobacco: Never Used  . Alcohol Use: No  . Drug Use: No  . Sexual Activity: Not on file   Other Topics Concern  . Not on file   Social History Narrative  . No narrative on file   Health Maintenance  Topic Date Due  . Pap Smear  01/15/1989  . Tetanus/tdap  01/15/1990  . Influenza Vaccine  11/23/2012    The following portions of the patient's history were reviewed and updated as appropriate: allergies, current medications, past family history, past medical history, past social history, past surgical history and problem list.  Review of Systems A comprehensive review of systems was negative.   Objective:    BP 118/60  Pulse 78  Temp(Src) 97.5 F (36.4 C)  Ht 5\' 4"  (1.626 m)  Wt 190 lb (86.183 kg)  BMI 32.60 kg/m2  LMP 04/15/2012 General appearance: alert, cooperative and appears stated age Head: Normocephalic, without obvious abnormality, atraumatic Eyes: conj clear, EOMi, PEERLA Ears: normal TM's and external ear canals both ears Nose: Nares normal. Septum midline. Mucosa normal. No drainage or sinus tenderness. Throat: lips, mucosa, and tongue normal; teeth and gums normal Neck: no adenopathy, no carotid bruit, no JVD, supple, symmetrical, trachea midline and thyroid not enlarged, symmetric, no tenderness/mass/nodules Back: symmetric, no curvature. ROM normal. No CVA tenderness. Lungs: clear to auscultation bilaterally Breasts: normal appearance, no masses or tenderness Heart: regular rate and rhythm, S1, S2 normal, no  murmur, click, rub or gallop Abdomen: soft, non-tender; bowel sounds normal; no masses,  no organomegaly Pelvic: cervix normal in appearance, external genitalia normal, no adnexal masses or tenderness, no cervical motion tenderness, rectovaginal septum normal, uterus normal size, shape, and consistency and vagina normal without discharge Extremities: extremities normal, atraumatic, no cyanosis or edema Pulses: 2+ and symmetric Skin: Skin color, texture, turgor normal. No rashes or lesions Lymph nodes: Cervical, supraclavicular, and axillary nodes normal. Neurologic: Alert and oriented X 3, normal strength and tone. Normal symmetric reflexes. Normal coordination and gait   Under both breasts with some hyperpigmentation and surrounding erythema. Looks somewhat moist.    Assessment:    Healthy female exam.      Plan:  Keep up a regular exercise program and make sure you are eating a healthy diet Try to eat 4 servings of dairy a day, or if you are lactose intolerant take a calcium with vitamin D daily.  Your vaccines are up to date.  Due for CMP and lipids  Breast rash - likely yeast/tinea. Will tx with nystatin powder. Call if not better on 1 week.    See After Visit Summary for Counseling Recommendations

## 2013-05-09 ENCOUNTER — Other Ambulatory Visit: Payer: Self-pay | Admitting: Family Medicine

## 2013-05-09 MED ORDER — METRONIDAZOLE 500 MG PO TABS: 500.0000 mg | ORAL_TABLET | Freq: Two times a day (BID) | ORAL | Status: DC

## 2013-06-07 ENCOUNTER — Encounter: Payer: Self-pay | Admitting: Physician Assistant

## 2013-06-07 ENCOUNTER — Ambulatory Visit (INDEPENDENT_AMBULATORY_CARE_PROVIDER_SITE_OTHER): Payer: BC Managed Care – PPO | Admitting: Physician Assistant

## 2013-06-07 VITALS — BP 111/71 | HR 62 | Temp 97.7°F

## 2013-06-07 DIAGNOSIS — J329 Chronic sinusitis, unspecified: Secondary | ICD-10-CM

## 2013-06-07 DIAGNOSIS — A499 Bacterial infection, unspecified: Secondary | ICD-10-CM

## 2013-06-07 DIAGNOSIS — B9689 Other specified bacterial agents as the cause of diseases classified elsewhere: Secondary | ICD-10-CM

## 2013-06-07 MED ORDER — FLUCONAZOLE 150 MG PO TABS: 150.0000 mg | ORAL_TABLET | Freq: Every day | ORAL | Status: DC

## 2013-06-07 MED ORDER — AMOXICILLIN 500 MG PO CAPS: 500.0000 mg | ORAL_CAPSULE | Freq: Two times a day (BID) | ORAL | Status: DC

## 2013-06-07 MED ORDER — METHYLPREDNISOLONE SODIUM SUCC 125 MG IJ SOLR
125.0000 mg | Freq: Once | INTRAMUSCULAR | Status: AC
Start: 2013-06-07 — End: 2013-06-07
  Administered 2013-06-07: 125 mg via INTRAMUSCULAR

## 2013-06-07 NOTE — Progress Notes (Signed)
   Subjective:    Patient ID: Candace Greene, female    DOB: 1970/07/15, 43 y.o.   MRN: [id number]  HPI Pt presents to the clinic with sinus pressure, bilateral ear pain, ST, nasal drainage for 2 weeks. She has a dry cough. Her HA worsened starting 4 days ago. She has had some nausea but no vomiting. Tried mucinex DM, sinus allergy, and nasonex. Some benefit but continues to get worse. She has some facial pain bilaterally. She denies any fever, chills, SOB or wheezing.     Review of Systems     Objective:   Physical Exam  Constitutional: She is oriented to person, place, and time. She appears well-developed and well-nourished.  HENT:  Head: Normocephalic and atraumatic.  Right Ear: External ear normal.  Left Ear: External ear normal.  Mouth/Throat: Oropharynx is clear and moist.  TM's clear bilaterally.   Bilateral maxillary sinus pressure.   Oropharynx is erythematous with PND. No exudate. No swollen tonsils.   Eyes: Conjunctivae are normal. Right eye exhibits no discharge. Left eye exhibits no discharge.  Neck: Normal range of motion. Neck supple.  Cardiovascular: Normal rate, regular rhythm and normal heart sounds.   Pulmonary/Chest: Effort normal and breath sounds normal. She has no wheezes.  Lymphadenopathy:    She has no cervical adenopathy.  Neurological: She is alert and oriented to person, place, and time.  Skin: Skin is warm and dry.  Psychiatric: She has a normal mood and affect. Her behavior is normal.          Assessment & Plan:  Bacterial sinusitis- Treated with 125mg  solumedrol IM and amoxil for 10 days. Symptomatic care given in HO. Call if not improving. Diflucan given to use since hx of yeast infection after abx usage.

## 2013-06-07 NOTE — Patient Instructions (Signed)

## 2013-06-11 ENCOUNTER — Ambulatory Visit: Payer: BC Managed Care – PPO | Admitting: Physician Assistant

## 2013-07-24 ENCOUNTER — Ambulatory Visit (INDEPENDENT_AMBULATORY_CARE_PROVIDER_SITE_OTHER): Payer: BC Managed Care – PPO | Admitting: Physician Assistant

## 2013-07-24 ENCOUNTER — Encounter: Payer: Self-pay | Admitting: Physician Assistant

## 2013-07-24 VITALS — BP 112/68 | HR 57 | Temp 98.2°F | Ht 64.0 in | Wt 194.0 lb

## 2013-07-24 DIAGNOSIS — J209 Acute bronchitis, unspecified: Secondary | ICD-10-CM

## 2013-07-24 DIAGNOSIS — M940 Chondrocostal junction syndrome [Tietze]: Secondary | ICD-10-CM

## 2013-07-24 MED ORDER — HYDROCODONE-HOMATROPINE 5-1.5 MG/5ML PO SYRP: 5.0000 mL | ORAL_SOLUTION | Freq: Every evening | ORAL | Status: DC | PRN

## 2013-07-24 MED ORDER — AZITHROMYCIN 250 MG PO TABS: ORAL_TABLET | ORAL | Status: DC

## 2013-07-24 NOTE — Patient Instructions (Addendum)
Acute Bronchitis Bronchitis is inflammation of the airways that extend from the windpipe into the lungs (bronchi). The inflammation often causes mucus to develop. This leads to a cough, which is the most common symptom of bronchitis.  In acute bronchitis, the condition usually develops suddenly and goes away over time, usually in a couple weeks. Smoking, allergies, and asthma can make bronchitis worse. Repeated episodes of bronchitis may cause further lung problems.  CAUSES Acute bronchitis is most often caused by the same virus that causes a cold. The virus can spread from person to person (contagious).  SIGNS AND SYMPTOMS   Cough.   Fever.   Coughing up mucus.   Body aches.   Chest congestion.   Chills.   Shortness of breath.   Sore throat.  DIAGNOSIS  Acute bronchitis is usually diagnosed through a physical exam. Tests, such as chest X-rays, are sometimes done to rule out other conditions.  TREATMENT  Acute bronchitis usually goes away in a couple weeks. Often times, no medical treatment is necessary. Medicines are sometimes given for relief of fever or cough. Antibiotics are usually not needed but may be prescribed in certain situations. In some cases, an inhaler may be recommended to help reduce shortness of breath and control the cough. A cool mist vaporizer may also be used to help thin bronchial secretions and make it easier to clear the chest.  HOME CARE INSTRUCTIONS  Get plenty of rest.   Drink enough fluids to keep your urine clear or pale yellow (unless you have a medical condition that requires fluid restriction). Increasing fluids may help thin your secretions and will prevent dehydration.   Only take over-the-counter or prescription medicines as directed by your health care provider.   Avoid smoking and secondhand smoke. Exposure to cigarette smoke or irritating chemicals will make bronchitis worse. If you are a smoker, consider using nicotine gum or skin  patches to help control withdrawal symptoms. Quitting smoking will help your lungs heal faster.   Reduce the chances of another bout of acute bronchitis by washing your hands frequently, avoiding people with cold symptoms, and trying not to touch your hands to your mouth, nose, or eyes.   Follow up with your health care provider as directed.  SEEK MEDICAL CARE IF: Your symptoms do not improve after 1 week of treatment.  SEEK IMMEDIATE MEDICAL CARE IF:  You develop an increased fever or chills.   You have chest pain.   You have severe shortness of breath.  You have bloody sputum.   You develop dehydration.  You develop fainting.  You develop repeated vomiting.  You develop a severe headache. MAKE SURE YOU:   Understand these instructions.  Will watch your condition.  Will get help right away if you are not doing well or get worse. Document Released: 05/19/2004 Document Revised: 12/12/2012 Document Reviewed: 10/02/2012 [employer] Patient Information 2014 [employer].  

## 2013-07-24 NOTE — Progress Notes (Signed)
   Subjective:    Patient ID: Candace Greene, female    DOB: 02-Jul-1970, 43 y.o.   MRN: [id number]  HPI Pt presents to the clinic with URI symptoms that started 7 days. She has tried to treat OtC meds such as mucinex, zyrtec, sudafed but continues to worsen.upper respirotory symptoms have improved but feels like all in her chest now. She now has right sided flank and back pain that is made worse with coughing that started 1 day ago. Last time she has this she had pneumonia and that concerned her. Denies any urinary symptoms. No fever. She has been hot and cold. She denies any sinus pressure or ear pain now. Cough is mildly productive and feels like she cannot get it up. Denies any wheezing or SOB.    Review of Systems     Objective:   Physical Exam  Constitutional: She is oriented to person, place, and time. She appears well-developed and well-nourished.  HENT:  Head: Normocephalic and atraumatic.  Right Ear: External ear normal.  Left Ear: External ear normal.  Nose: Nose normal.  Mouth/Throat: Oropharynx is clear and moist.  Eyes: Conjunctivae are normal.  Neck: Normal range of motion. Neck supple.  Cardiovascular: Normal rate, regular rhythm and normal heart sounds.   Pulmonary/Chest: Effort normal and breath sounds normal. She has no wheezes.  Lymphadenopathy:    She has no cervical adenopathy.  Neurological: She is alert and oriented to person, place, and time.  Skin: Skin is dry.  Psychiatric: She has a normal mood and affect. Her behavior is normal.          Assessment & Plan:  Acute bronchitis-treated with zpak that will also cover for pneumonia. Continue nasonex. Hycodan for cough at bedtime. Continue symptomatic treatment and mucinex for next couple of days. If not improving call office.   Costochondritis- likely right sided pain with coughing is inflammation of cartledge in lungs/ribs. Suggested NSAIDs regularly for next 2-5 days. Did give cough syrup for cough.

## 2013-08-14 ENCOUNTER — Other Ambulatory Visit: Payer: Self-pay | Admitting: Family Medicine

## 2013-08-14 DIAGNOSIS — Z1231 Encounter for screening mammogram for malignant neoplasm of breast: Secondary | ICD-10-CM

## 2013-08-15 ENCOUNTER — Ambulatory Visit (INDEPENDENT_AMBULATORY_CARE_PROVIDER_SITE_OTHER): Payer: BC Managed Care – PPO

## 2013-08-15 DIAGNOSIS — Z1231 Encounter for screening mammogram for malignant neoplasm of breast: Secondary | ICD-10-CM

## 2013-08-16 LAB — LIPID PANEL
CHOL/HDL RATIO: 2.3 ratio
CHOLESTEROL: 161 mg/dL (ref 0–200)
HDL: 70 mg/dL (ref >39)
LDL Cholesterol: 78 mg/dL (ref 0–99)
Triglycerides: 64 mg/dL (ref <150)
VLDL: 13 mg/dL (ref 0–40)

## 2013-08-16 LAB — COMPLETE METABOLIC PANEL WITH GFR
ALBUMIN: 3.6 g/dL (ref 3.5–5.2)
ALK PHOS: 43 U/L (ref 39–117)
ALT: 30 U/L (ref 0–35)
AST: 29 U/L (ref 0–37)
BUN: 8 mg/dL (ref 6–23)
CALCIUM: 8.8 mg/dL (ref 8.4–10.5)
CO2: 24 mEq/L (ref 19–32)
Chloride: 108 mEq/L (ref 96–112)
Creat: 0.85 mg/dL (ref 0.50–1.10)
GFR, EST NON AFRICAN AMERICAN: 85 mL/min
GLUCOSE: 81 mg/dL (ref 70–99)
POTASSIUM: 4.1 meq/L (ref 3.5–5.3)
Sodium: 139 mEq/L (ref 135–145)
Total Bilirubin: 0.8 mg/dL (ref 0.2–1.2)
Total Protein: 6.9 g/dL (ref 6.0–8.3)

## 2013-08-19 ENCOUNTER — Encounter: Payer: Self-pay | Admitting: Emergency Medicine

## 2013-08-19 ENCOUNTER — Telehealth: Payer: Self-pay | Admitting: *Deleted

## 2013-08-19 ENCOUNTER — Emergency Department (INDEPENDENT_AMBULATORY_CARE_PROVIDER_SITE_OTHER)
Admission: EM | Admit: 2013-08-19 | Discharge: 2013-08-19 | Disposition: A | Discharge status: Home / Self Care | Payer: BC Managed Care – PPO | Source: Home / Self Care | Attending: Family Medicine | Admitting: Family Medicine

## 2013-08-19 DIAGNOSIS — B9789 Other viral agents as the cause of diseases classified elsewhere: Principal | ICD-10-CM

## 2013-08-19 DIAGNOSIS — J069 Acute upper respiratory infection, unspecified: Secondary | ICD-10-CM

## 2013-08-19 DIAGNOSIS — J0100 Acute maxillary sinusitis, unspecified: Secondary | ICD-10-CM

## 2013-08-19 MED ORDER — PREDNISONE 20 MG PO TABS: 20.0000 mg | ORAL_TABLET | Freq: Two times a day (BID) | ORAL | Status: DC

## 2013-08-19 MED ORDER — FLUTICASONE PROPIONATE 50 MCG/ACT NA SUSP: NASAL | Status: DC

## 2013-08-19 MED ORDER — BENZONATATE 200 MG PO CAPS: 200.0000 mg | ORAL_CAPSULE | Freq: Every day | ORAL | Status: DC

## 2013-08-19 MED ORDER — AZITHROMYCIN 250 MG PO TABS: ORAL_TABLET | ORAL | Status: DC

## 2013-08-19 MED ORDER — FLUCONAZOLE 150 MG PO TABS: 150.0000 mg | ORAL_TABLET | Freq: Once | ORAL | Status: DC

## 2013-08-19 NOTE — ED Notes (Signed)
Pt c/o sneezing, nasal congestion, HA, and RT side facial pain and pressure x 1 wk. Denies fever. She has taken Mucinex and [employer], [employer].

## 2013-08-19 NOTE — ED Provider Notes (Signed)
CSN: [id number]     Arrival date & time 08/19/13  1906 History   First MD Initiated Contact with Patient 08/19/13 1945     Chief Complaint  Patient presents with  . Headache  . Facial Pain      HPI Comments: One week ago patient developed URI symptoms with sore throat, eye redness, sneezing, fatigue, chills, and post-nasal drainage.  Her right ear has felt clogged and she has a right side headache.  She developed a cough today.  The history is provided by the patient.    Past Medical History  Diagnosis Date  . Heel spur     right   History reviewed. No pertinent past surgical history. Family History  Problem Relation Age of Onset  . Stroke Father     <60  . Birth defects Other   . Hyperlipidemia Other    History  Substance Use Topics  . Smoking status: Never Smoker   . Smokeless tobacco: Never Used  . Alcohol Use: No   OB History   Grav Para Term Preterm Abortions TAB SAB Ect Mult Living                 Review of Systems + sore throat + cough No pleuritic pain + wheezing + nasal congestion + post-nasal drainage + sinus pain/pressure No itchy/red eyes ? earache No hemoptysis No SOB No fever, + chills/sweats No nausea No vomiting No abdominal pain No diarrhea No urinary symptoms No skin rash + fatigue No myalgias + headache Used OTC meds without relief  Allergies  Review of patient's allergies indicates no known allergies.  Home Medications   Prior to Admission medications   Medication Sig Start Date End Date Taking? Authorizing Provider  azithromycin (ZITHROMAX) 250 MG tablet Take 2 tablets now and then one tablet for 4 days. 07/24/13   Jade L Breeback, PA-C  HYDROcodone-homatropine (HYCODAN) 5-1.5 MG/5ML syrup Take 5 mLs by mouth at bedtime as needed for cough. 07/24/13   Jade L Breeback, PA-C  meloxicam (MOBIC) 15 MG tablet One tab PO qAM with breakfast for 2 weeks 05/03/13   Hali Marry, MD  mometasone (NASONEX) 50 MCG/ACT nasal spray Place 2  sprays into the nose daily. 05/03/13 05/03/14  Hali Marry, MD  nystatin (MYCOSTATIN/NYSTOP) 100000 UNIT/GM POWD 1 application under each breast BID 05/03/13   Hali Marry, MD   BP 111/75  Pulse 60  Temp(Src) 97.8 F (36.6 C) (Oral)  Resp 18  Ht 5\' 1"  (1.549 m)  Wt 198 lb (89.812 kg)  BMI 37.43 kg/m2  SpO2 98%  LMP 08/03/2013 Physical Exam Nursing notes and Vital Signs reviewed. Appearance:  Patient appears stated age, and in no acute distress.  Patient is obese (BMI 37.4) Eyes:  Pupils are equal, round, and reactive to light and accomodation.  Extraocular movement is intact.  Conjunctivae are not inflamed  Ears:  Canals normal.  Tympanic membranes normal.  Nose:  Congested turbinates, worse on right.   Right maxillary sinus tenderness is present.  Pharynx:  Normal Neck:  Supple. Tender enlarged posterior nodes are palpated bilaterally  Lungs:  Clear to auscultation.  Breath sounds are equal.  Chest:  Distinct tenderness to palpation over the mid-sternum.  Heart:  Regular rate and rhythm without murmurs, rubs, or gallops.  Abdomen:  Nontender without masses or hepatosplenomegaly.  Bowel sounds are present.  No CVA or flank tenderness.  Extremities:  No edema.  No calf tenderness Skin:  No rash present.  ED Course  Procedures  none      MDM   1. Viral URI with cough   2. Acute maxillary sinusitis    Begin Z-pack, and prednisone burst.  Resume Flonase.  Prescription written for Benzonatate [facility]) to take at bedtime for night-time cough.   Rx for Diflucan if yeast infection develops.  Take plain Mucinex (1200 mg guaifenesin) twice daily for cough and congestion.  May add Sudafed for sinus congestion.   Increase fluid intake, rest. May use Afrin nasal spray (or generic oxymetazoline) twice daily for about 5 days.  Also recommend using saline nasal spray several times daily and saline nasal irrigation (AYR is a common brand).  Use Flonase spray after Afrin spray  and saline rinse. Try warm salt water gargles for sore throat.  Stop all antihistamines for now, and other non-prescription cough/cold preparations. Follow-up with family doctor if not improving 7 to 10 days.     Kandra Nicolas, MD 08/27/13 [phone]

## 2013-08-19 NOTE — Discharge Instructions (Signed)
Take plain Mucinex (1200 mg guaifenesin) twice daily for cough and congestion.  May add Sudafed for sinus congestion.   Increase fluid intake, rest. May use Afrin nasal spray (or generic oxymetazoline) twice daily for about 5 days.  Also recommend using saline nasal spray several times daily and saline nasal irrigation (AYR is a common brand).  Use Flonase spray after Afrin spray and saline rinse. Try warm salt water gargles for sore throat.  Stop all antihistamines for now, and other non-prescription cough/cold preparations. Follow-up with family doctor if not improving 7 to 10 days.

## 2013-08-19 NOTE — Telephone Encounter (Signed)
Pt called asking if we could send her in another abx.  She states that after finishing the last abx from 4/1 she felt a lot better but now has a really bad headache & tons of sinus pressure & draining.  I encouraged her to continue mucinex & flonase; that it's likely viral.  She states that this is an ongoing problem & has been trying these things since last week but continues to get worse.  Pt works until 5 & can't come in today so advised pt to be seen in our Urgent Care.

## 2013-09-04 ENCOUNTER — Emergency Department (INDEPENDENT_AMBULATORY_CARE_PROVIDER_SITE_OTHER)
Admission: EM | Admit: 2013-09-04 | Discharge: 2013-09-04 | Disposition: A | Discharge status: Home / Self Care | Payer: BC Managed Care – PPO | Source: Home / Self Care | Attending: Family Medicine | Admitting: Family Medicine

## 2013-09-04 ENCOUNTER — Encounter: Payer: Self-pay | Admitting: Emergency Medicine

## 2013-09-04 DIAGNOSIS — L03011 Cellulitis of right finger: Secondary | ICD-10-CM

## 2013-09-04 DIAGNOSIS — L02519 Cutaneous abscess of unspecified hand: Secondary | ICD-10-CM

## 2013-09-04 DIAGNOSIS — L03019 Cellulitis of unspecified finger: Secondary | ICD-10-CM

## 2013-09-04 MED ORDER — TRIAMCINOLONE ACETONIDE 0.1 % EX CREA: TOPICAL_CREAM | CUTANEOUS | Status: DC

## 2013-09-04 MED ORDER — MUPIROCIN CALCIUM 2 % EX CREA: 1.0000 "application " | TOPICAL_CREAM | Freq: Three times a day (TID) | CUTANEOUS | Status: DC

## 2013-09-04 NOTE — Discharge Instructions (Signed)
May take Ibuprofen 200mg, 4 tabs every 8 hours with food as needed for pain. 

## 2013-09-04 NOTE — ED Provider Notes (Signed)
CSN: [id number]     Arrival date & time 09/04/13  61 History   First MD Initiated Contact with Patient 09/04/13 1907     Chief Complaint  Patient presents with  . Insect Bite      HPI Comments: Patient believes that she may have had an insect bite to her right 5th finger two days ago.  The site was red at first, then became swollen and pruritic.  She has been applying 1%HC cream.  Patient is a 43 y.o. female presenting with hand pain. The history is provided by the patient.  Hand Pain This is a new problem. The current episode started 2 days ago. The problem occurs constantly. The problem has been gradually worsening. Associated symptoms comments: No fever. Nothing aggravates the symptoms. Nothing relieves the symptoms. Treatments tried: HC cream.    Past Medical History  Diagnosis Date  . Heel spur     right   History reviewed. No pertinent past surgical history. Family History  Problem Relation Age of Onset  . Stroke Father     <60  . Birth defects Other   . Hyperlipidemia Other    History  Substance Use Topics  . Smoking status: Never Smoker   . Smokeless tobacco: Never Used  . Alcohol Use: No   OB History   Grav Para Term Preterm Abortions TAB SAB Ect Mult Living                 Review of Systems  All other systems reviewed and are negative.   Allergies  Review of patient's allergies indicates no known allergies.  Home Medications   Prior to Admission medications   Medication Sig Start Date End Date Taking? Authorizing Provider  azithromycin (ZITHROMAX) 250 MG tablet Take 2 tablets now and then one tablet for 4 days. 08/19/13   Kandra Nicolas, MD  benzonatate (TESSALON) 200 MG capsule Take 1 capsule (200 mg total) by mouth at bedtime. Take as needed for cough 08/19/13   Kandra Nicolas, MD  fluconazole (DIFLUCAN) 150 MG tablet Take 1 tablet (150 mg total) by mouth once. Take one tab by mouth once for yeast infection.  May repeat in 3 to 4 days. 08/19/13    Kandra Nicolas, MD  fluticasone Asencion Islam) 50 MCG/ACT nasal spray Place 2 sprays in each nostril once daily for allergy symptoms 08/19/13   Kandra Nicolas, MD  mometasone (NASONEX) 50 MCG/ACT nasal spray Place 2 sprays into the nose daily. 05/03/13 05/03/14  Hali Marry, MD  nystatin (MYCOSTATIN/NYSTOP) 100000 UNIT/GM POWD 1 application under each breast BID 05/03/13   Hali Marry, MD  predniSONE (DELTASONE) 20 MG tablet Take 1 tablet (20 mg total) by mouth 2 (two) times daily. Take with food. 08/19/13   Kandra Nicolas, MD   BP 112/71  Pulse 66  Temp(Src) 98.2 F (36.8 C) (Oral)  Resp 18  Ht 5\' 2"  (1.575 m)  Wt 196 lb (88.905 kg)  BMI 35.84 kg/m2  SpO2 100%  LMP 08/03/2013 Physical Exam  Nursing note and vitals reviewed. Constitutional: She is oriented to person, place, and time. She appears well-developed and well-nourished. No distress.  Eyes: Conjunctivae are normal. Pupils are equal, round, and reactive to light.  Musculoskeletal:       Right hand: She exhibits tenderness and swelling. She exhibits normal range of motion, no bony tenderness, normal two-point discrimination and normal capillary refill. Normal sensation noted. Normal strength noted.  Hands: Over the dorsum of the right 5th finger proximal phalanx is an area of erythema and swelling about 56mm by 25mm, mildly tender.  No fluctuance.  Finger has full range of motion.  Distal neurovascular function is intact.   Neurological: She is alert and oriented to person, place, and time.  Skin: Skin is warm and dry.    ED Course  Procedures none        MDM   1. Cellulitis of fifth finger of right hand; ?insect bite.    Begin Bactroban cream.  Kenalog cream for itching. May take Ibuprofen 200mg , 4 tabs every 8 hours with food as needed for pain Followup with Dr. Aundria Mems ([facility]) if not improving about two weeks.     Kandra Nicolas, MD 09/07/13 1049

## 2013-09-04 NOTE — ED Notes (Signed)
Pt c/o insect bite to her RT 5th finger x 2 days ago with swelling and itching. Denies fever.

## 2013-09-23 ENCOUNTER — Other Ambulatory Visit: Payer: Self-pay | Admitting: Family Medicine

## 2013-09-23 ENCOUNTER — Encounter: Payer: Self-pay | Admitting: Family Medicine

## 2013-09-23 ENCOUNTER — Ambulatory Visit (HOSPITAL_BASED_OUTPATIENT_CLINIC_OR_DEPARTMENT_OTHER)
Admission: RE | Admit: 2013-09-23 | Discharge: 2013-09-23 | Disposition: A | Discharge status: Home / Self Care | Payer: BC Managed Care – PPO | Source: Ambulatory Visit | Attending: Family Medicine | Admitting: Family Medicine

## 2013-09-23 ENCOUNTER — Ambulatory Visit (INDEPENDENT_AMBULATORY_CARE_PROVIDER_SITE_OTHER): Payer: BC Managed Care – PPO | Admitting: Family Medicine

## 2013-09-23 VITALS — BP 110/64 | HR 64 | Wt 195.0 lb

## 2013-09-23 DIAGNOSIS — M7989 Other specified soft tissue disorders: Secondary | ICD-10-CM | POA: Insufficient documentation

## 2013-09-23 DIAGNOSIS — M79604 Pain in right leg: Secondary | ICD-10-CM

## 2013-09-23 DIAGNOSIS — M79609 Pain in unspecified limb: Secondary | ICD-10-CM

## 2013-09-23 DIAGNOSIS — W57XXXA Bitten or stung by nonvenomous insect and other nonvenomous arthropods, initial encounter: Secondary | ICD-10-CM

## 2013-09-23 DIAGNOSIS — R7989 Other specified abnormal findings of blood chemistry: Secondary | ICD-10-CM

## 2013-09-23 DIAGNOSIS — R319 Hematuria, unspecified: Secondary | ICD-10-CM

## 2013-09-23 LAB — POCT URINALYSIS DIPSTICK
BILIRUBIN UA: NEGATIVE
GLUCOSE UA: NEGATIVE
Ketones, UA: NEGATIVE
Leukocytes, UA: NEGATIVE
Nitrite, UA: NEGATIVE
Protein, UA: NEGATIVE
Urobilinogen, UA: 0.2
pH, UA: 6

## 2013-09-23 LAB — D-DIMER, QUANTITATIVE: D-Dimer, Quant: 1.35 ug/mL-FEU — ABNORMAL HIGH (ref 0.00–0.48)

## 2013-09-23 NOTE — Progress Notes (Signed)
   Subjective:    Patient ID: Candace Greene, female    DOB: 05-Dec-1970, 43 y.o.   MRN: [id number]  HPI Complains of right leg pain and swelling for approximately 2 weeks.  Not on the the left leg she does stand on her feet all the time and she has gained some weight.  Says worse when has been sitting or standing for a period and then tries to move or walk. Has been getting some numbness and tingling in that leg as well. She's not sure what may have triggered it. She's never had problems with this before. She does stand on her feet a lot. No trauma or injury or fall recently. She has not noticed any unusual rashes but has noticed some healing bug bites on the back of her hands.  Had some bites on her hans but they are healing some. They are very ichey.  She has been putting anti-itch cream and a band aid.  She feels that they're healing well.   Review of Systems She denies any chest pain, shortness of breath or urinary symptoms. No recent fever chills or sweats or cold symptoms. She is not on birth control and no prior history of blood clots. She has never smoked.    Objective:   Physical Exam  Constitutional: She is oriented to person, place, and time. She appears well-developed and well-nourished.  HENT:  Head: Normocephalic and atraumatic.  Cardiovascular: Normal rate, regular rhythm and normal heart sounds.   Pulmonary/Chest: Effort normal and breath sounds normal.  Musculoskeletal: She exhibits edema.  Trace edema over the right foot and ankle but no pitting edema. Nontender calf squeeze. Able to flex and extend the ankle without any difficulty pain or problems. She also has some trace edema around the right knee. She's nontender over the knee joint. No significant crepitus. No bruising or rash. Patellar reflexes 1+ bilaterally.  Neurological: She is alert and oriented to person, place, and time.  Skin: Skin is warm and dry.  She does have a couple of lesions on the dorsum of her left hand  where the skin appears to be peeling. She has a small scab on the dorsum of the left hand. She felt like these were where she had bug bites.  Psychiatric: She has a normal mood and affect. Her behavior is normal.          Assessment & Plan:  Right leg swelling-unclear etiology at this point. She had normal kidney function and liver function about 6-8 weeks ago on recent blood work. I will check her thyroid since she's also had some numbness and doing with it and some weight gain. This early to be somewhat secondary to weight gain as it does tend to cause more venous stasis and fluid retention but it's asymmetric. Also consider blood clot that she doesn't have any pain or tenderness with calf squeeze. In fact she says it actually feels good. I will check a d-dimer just to be on the safe side if it is elevated then we'll move forward with a Doppler of the lower Schimke. She's really not any medications that should be causing his symptoms. Her pressure is stable for her. She is not a tobacco user. Recent cholesterol was completely normal. Overall low risk for cardiac disease. We'll also check a urinalysis to rule out UTI.  Bug bites - appear to be healing. If not completely resolving then please let me know.

## 2013-09-23 NOTE — Addendum Note (Signed)
Addended by: Teddy Spike on: 09/23/2013 04:57 PM   Modules accepted: Orders

## 2013-09-24 LAB — CBC WITH DIFFERENTIAL/PLATELET
Basophils Absolute: 0 10*3/uL (ref 0.0–0.1)
Basophils Relative: 0 % (ref 0–1)
EOS ABS: 0.1 10*3/uL (ref 0.0–0.7)
EOS PCT: 2 % (ref 0–5)
HEMATOCRIT: 37.2 % (ref 36.0–46.0)
HEMOGLOBIN: 12.4 g/dL (ref 12.0–15.0)
LYMPHS ABS: 2.1 10*3/uL (ref 0.7–4.0)
Lymphocytes Relative: 43 % (ref 12–46)
MCH: 31.6 pg (ref 26.0–34.0)
MCHC: 33.3 g/dL (ref 30.0–36.0)
MCV: 94.7 fL (ref 78.0–100.0)
MONO ABS: 0.5 10*3/uL (ref 0.1–1.0)
Monocytes Relative: 10 % (ref 3–12)
Neutro Abs: 2.2 10*3/uL (ref 1.7–7.7)
Neutrophils Relative %: 45 % (ref 43–77)
PLATELETS: 290 10*3/uL (ref 150–400)
RBC: 3.93 MIL/uL (ref 3.87–5.11)
RDW: 15.6 % — ABNORMAL HIGH (ref 11.5–15.5)
WBC: 4.9 10*3/uL (ref 4.0–10.5)

## 2013-09-24 LAB — TSH: TSH: 0.694 u[IU]/mL (ref 0.350–4.500)

## 2013-09-25 LAB — URINE CULTURE
COLONY COUNT: NO GROWTH
Organism ID, Bacteria: NO GROWTH

## 2013-10-22 ENCOUNTER — Ambulatory Visit: Payer: BC Managed Care – PPO | Admitting: Family Medicine

## 2013-10-30 ENCOUNTER — Telehealth: Payer: Self-pay | Admitting: *Deleted

## 2013-10-30 NOTE — Telephone Encounter (Signed)
Called to inquire what FMLA forms are for. lvm for return call.Candace Greene

## 2013-11-07 ENCOUNTER — Ambulatory Visit: Payer: BC Managed Care – PPO | Admitting: Family Medicine

## 2013-11-08 NOTE — Telephone Encounter (Signed)
Closed due to no response.Candace Greene [place]

## 2014-02-05 ENCOUNTER — Ambulatory Visit: Payer: BC Managed Care – PPO | Admitting: Family Medicine

## 2014-02-27 ENCOUNTER — Ambulatory Visit: Payer: BC Managed Care – PPO

## 2014-02-27 ENCOUNTER — Ambulatory Visit (INDEPENDENT_AMBULATORY_CARE_PROVIDER_SITE_OTHER): Payer: BLUE CROSS/BLUE SHIELD | Admitting: Family Medicine

## 2014-02-27 ENCOUNTER — Encounter: Payer: Self-pay | Admitting: Family Medicine

## 2014-02-27 VITALS — BP 121/62 | HR 70 | Temp 98.2°F | Wt 190.0 lb

## 2014-02-27 DIAGNOSIS — N920 Excessive and frequent menstruation with regular cycle: Secondary | ICD-10-CM

## 2014-02-27 DIAGNOSIS — N8320 Unspecified ovarian cysts: Secondary | ICD-10-CM | POA: Diagnosis not present

## 2014-02-27 DIAGNOSIS — N83202 Unspecified ovarian cyst, left side: Secondary | ICD-10-CM

## 2014-02-27 DIAGNOSIS — D509 Iron deficiency anemia, unspecified: Secondary | ICD-10-CM | POA: Diagnosis not present

## 2014-02-27 DIAGNOSIS — N644 Mastodynia: Secondary | ICD-10-CM

## 2014-02-27 DIAGNOSIS — R1032 Left lower quadrant pain: Secondary | ICD-10-CM | POA: Diagnosis not present

## 2014-02-27 DIAGNOSIS — Z23 Encounter for immunization: Secondary | ICD-10-CM

## 2014-02-27 LAB — POCT HEMOGLOBIN: Hemoglobin: 10.8 g/dL — AB (ref 12.2–16.2)

## 2014-02-27 MED ORDER — DESOGESTREL-ETHINYL ESTRADIOL 0.15-0.02/0.01 MG (21/5) PO TABS: 1.0000 | ORAL_TABLET | Freq: Every day | ORAL | Status: DC

## 2014-02-27 NOTE — Progress Notes (Signed)
   Subjective:    Patient ID: Candace Greene, female    DOB: [birth year], 43 y.o.   MRN: [id number]  HPI Throbbing breast pain x 2 weeks.  Breasts feel heavy.  No trauma, injury or heavy lifting.  She asked to get fitted for new part where the summer and did get fitted for a new size. She says her pain seems to come in waves and seems to be mostly around the nipple area bilaterally. She does not feel any abnormal lumps or bumps. No soreness in the axilla. No worsening or alleviating factors.She is sexually active.  Had a ruptred ovarian cyst a few weeks ago that lead to an ED visit. She was told may need a f/u US if she starts having pain again. Started to have the same LLQ pain yesterday. She reports very heavy menstrual cycles.  Review of Systems She denies any dysuria or hematuria but would like a new note for work. They are only allowed to go to the bathroom after 5 hours. They are not allowed interrupter shift to use the restroom. We had written her a note last year and she has to be renewed this year.    Objective:   Physical Exam  Constitutional: She appears well-nourished.  HENT:  Head: Normocephalic and atraumatic.  Abdominal: Soft. She exhibits no distension and no mass. There is tenderness. There is no rebound and no guarding.  TTP in the left lower quad.   Skin: Skin is dry.  Psychiatric: She has a normal mood and affect. Her behavior is normal.          Assessment & Plan:  Bilat breast pain -unclear etiology at this point. Certainly it could be hormonal. She's currently having her menstrual cycle which she reports very heavy. Curvature to give it one more week after she completes her. To see if the breast pain resolves. She is sexually active but is adamant that she does not think that she is pregnant. Discussed importance of good supportive bra wear. She says she actually got refitted about 2 or 3 months ago. She feels like she has had good support. She mostly wears an underwire.  Did encourage her to cut back on her caffeine intake. If she's not feeling some improvement over the next week then recommend diagnostic mammogram for further evaluation.  Heavy periods- will check Hb to make sure not anemic. We'll get ultrasound as well to evaluate for thickened lining of the uterus. Hemoglobin was low compared to previous hemoglobin level in June. We'll get a full CBC and ferritin level today. Will likely need to start her on iron replacement.  Ruptured ovarian cyst-recommend repeat ultrasound since she's having recurrent pain. Will place an order for this. Also encouraged her to consider birth control. This could help with these cysts as well as her heavy cycles. She says his been years since she's been on birth control. She denies any prior history of blood clots or side effects on birth control.   Work note give for yesterday and today. Also renewed note saying that she can use the restroom every 2-3 hours at work.

## 2014-02-27 NOTE — Addendum Note (Signed)
Addended by: Margette Fast on: 02/27/2014 03:36 PM   Modules accepted: Orders

## 2014-02-28 LAB — CBC
HEMATOCRIT: 36.7 % (ref 36.0–46.0)
HEMOGLOBIN: 12.4 g/dL (ref 12.0–15.0)
MCH: 33.1 pg (ref 26.0–34.0)
MCHC: 33.8 g/dL (ref 30.0–36.0)
MCV: 97.9 fL (ref 78.0–100.0)
Platelets: 279 10*3/uL (ref 150–400)
RBC: 3.75 MIL/uL — ABNORMAL LOW (ref 3.87–5.11)
RDW: 15.2 % (ref 11.5–15.5)
WBC: 6.1 10*3/uL (ref 4.0–10.5)

## 2014-02-28 LAB — FERRITIN: Ferritin: 27 ng/mL (ref 10–291)

## 2014-05-01 ENCOUNTER — Encounter: Payer: Self-pay | Admitting: Family Medicine

## 2014-05-01 ENCOUNTER — Ambulatory Visit (INDEPENDENT_AMBULATORY_CARE_PROVIDER_SITE_OTHER): Payer: Self-pay | Admitting: Family Medicine

## 2014-05-01 VITALS — BP 131/71 | HR 73 | Wt 191.0 lb

## 2014-05-01 DIAGNOSIS — R3915 Urgency of urination: Secondary | ICD-10-CM

## 2014-05-01 DIAGNOSIS — N920 Excessive and frequent menstruation with regular cycle: Secondary | ICD-10-CM

## 2014-05-01 DIAGNOSIS — N898 Other specified noninflammatory disorders of vagina: Secondary | ICD-10-CM

## 2014-05-01 DIAGNOSIS — N83202 Unspecified ovarian cyst, left side: Secondary | ICD-10-CM

## 2014-05-01 DIAGNOSIS — N8320 Unspecified ovarian cysts: Secondary | ICD-10-CM

## 2014-05-01 MED ORDER — CIPROFLOXACIN HCL 500 MG PO TABS: 500.0000 mg | ORAL_TABLET | Freq: Two times a day (BID) | ORAL | Status: AC

## 2014-05-01 NOTE — Progress Notes (Signed)
   Subjective:    Patient ID: Candace Greene, female    DOB: 04/03/1971, 44 y.o.   MRN: [id number]  HPI Has noticed a urinary odor and has noticed some urinary urgency for about 1 month.  Thinks may be started her period today.  No fever, chills. Has been feeling more tired.  Has been having pelvic discomfort like cramping.  She says this feels different to the pain she was having when she had the ruptured cyst on her left ovary.  Seen 2 months ago for cyst on her left ovary and started on birth control. Skipped he cycle last month.   Heavy periods- started on birth control 2 months ago. Has been doing well except she skipped her period when she was due. She is a most at the end of this next pill pack and thinks she might be starting her period right now. Review of Systems     Objective:   Physical Exam  Constitutional: She is oriented to person, place, and time. She appears well-developed and well-nourished.  HENT:  Head: Normocephalic and atraumatic.  Cardiovascular: Normal rate, regular rhythm and normal heart sounds.   Pulmonary/Chest: Effort normal and breath sounds normal.  Abdominal: Soft. Bowel sounds are normal. She exhibits no distension and no mass. There is tenderness. There is no rebound and no guarding.  Gentle palpation the left lower quadrant and right lower quadrant and suprapubic area.  Neurological: She is alert and oriented to person, place, and time.  Skin: Skin is warm and dry.  Psychiatric: She has a normal mood and affect. Her behavior is normal.          Assessment & Plan:  Left ovarian cyst - repeat US to see if cyst has resolved. It was previously ruptured. She still having some pelvic discomfort but it is a different type of pain.  Vaginitis - will check wet prep. Will call with resultso once available.    UTI. UA pos. Will tx with Cipro 500mg  BID. Call if not bettter in one week. No culture sent.   Menorrhagia - doing well on OCPs. Discussed with her  that it's still within the normal to occasionally skip a period while on birth control.

## 2014-05-01 NOTE — Patient Instructions (Signed)

## 2014-05-02 LAB — WET PREP, GENITAL
Clue Cells Wet Prep HPF POC: NONE SEEN
Trich, Wet Prep: NONE SEEN
WBC, Wet Prep HPF POC: NONE SEEN
Yeast Wet Prep HPF POC: NONE SEEN

## 2014-07-03 ENCOUNTER — Ambulatory Visit: Payer: BLUE CROSS/BLUE SHIELD | Admitting: Family Medicine

## 2014-07-10 ENCOUNTER — Ambulatory Visit: Payer: BLUE CROSS/BLUE SHIELD | Admitting: Family Medicine

## 2014-07-10 ENCOUNTER — Emergency Department (INDEPENDENT_AMBULATORY_CARE_PROVIDER_SITE_OTHER)
Admission: EM | Admit: 2014-07-10 | Discharge: 2014-07-10 | Disposition: A | Discharge status: Home / Self Care | Payer: BLUE CROSS/BLUE SHIELD | Source: Home / Self Care | Attending: Emergency Medicine | Admitting: Emergency Medicine

## 2014-07-10 ENCOUNTER — Encounter: Payer: Self-pay | Admitting: Emergency Medicine

## 2014-07-10 DIAGNOSIS — H65112 Acute and subacute allergic otitis media (mucoid) (sanguinous) (serous), left ear: Secondary | ICD-10-CM

## 2014-07-10 DIAGNOSIS — J0101 Acute recurrent maxillary sinusitis: Secondary | ICD-10-CM | POA: Diagnosis not present

## 2014-07-10 DIAGNOSIS — J301 Allergic rhinitis due to pollen: Secondary | ICD-10-CM

## 2014-07-10 MED ORDER — CEFDINIR 300 MG PO CAPS: 300.0000 mg | ORAL_CAPSULE | Freq: Two times a day (BID) | ORAL | Status: DC

## 2014-07-10 MED ORDER — PREDNISONE 50 MG PO TABS: ORAL_TABLET | ORAL | Status: DC

## 2014-07-10 NOTE — ED Provider Notes (Signed)
CSN: [id number]     Arrival date & time 07/10/14  1749 History   First MD Initiated Contact with Patient 07/10/14 1803     Chief Complaint  Patient presents with  . Otalgia   (Consider location/radiation/quality/duration/timing/severity/associated sxs/prior Treatment) HPI URI HISTORY  Candace Greene is a 44 y.o. female who complains of sinus congestion, postnasal drainage, discolored rhinorrhea and worsening left maxillary sinus pain and left ear pain 2 days. Pain is sharp and dull, 6 out of 10 intensity.   Have been using over-the-counter Nasonex which helps a little bit.--Of note, pollen counts are high and she feels her sinus allergies have triggered this acute infection.  No chills/sweats +  Mild Fever  +  Nasal congestion +  Discolored Post-nasal drainage Positive sinus pain/pressure Mild sore throat  No cough No wheezing No chest congestion No hemoptysis No shortness of breath No pleuritic pain  Mild itchy eyes without discharge Left earache  No nausea No vomiting No abdominal pain No diarrhea  No skin rashes +  Fatigue No myalgias No headache   Past Medical History  Diagnosis Date  . Heel spur     right   History reviewed. No pertinent past surgical history. Family History  Problem Relation Age of Onset  . Stroke Father     <60  . Birth defects Other   . Hyperlipidemia Other    History  Substance Use Topics  . Smoking status: Never Smoker   . Smokeless tobacco: Never Used  . Alcohol Use: No   OB History    No data available     Review of Systems  All other systems reviewed and are negative.   Allergies  Review of patient's allergies indicates not on file.  Home Medications   Prior to Admission medications   Medication Sig Start Date End Date Taking? Authorizing Provider  cefdinir (OMNICEF) 300 MG capsule Take 1 capsule (300 mg total) by mouth 2 (two) times daily. X 10 days 07/10/14   Jacqulyn Cane, MD  desogestrel-ethinyl estradiol (KARIVA)  0.15-0.02/0.01 MG (21/5) tablet Take 1 tablet by mouth daily. 02/27/14   Hali Marry, MD  mometasone (NASONEX) 50 MCG/ACT nasal spray Place 2 sprays into the nose daily. 05/03/13 05/03/14  Hali Marry, MD  predniSONE (DELTASONE) 50 MG tablet Take 1 daily with food for 5 days. 07/10/14   Jacqulyn Cane, MD   BP 132/87 mmHg  Pulse 74  Temp(Src) 97.3 F (36.3 C) (Oral)  Ht 5\' 3"  (1.6 m)  Wt 196 lb (88.905 kg)  BMI 34.73 kg/m2  SpO2 98% Physical Exam  Constitutional: She is oriented to person, place, and time. She appears well-developed and well-nourished. No distress.  HENT:  Head: Normocephalic and atraumatic.  Right Ear: Tympanic membrane, external ear and ear canal normal.  Left Ear: External ear and ear canal normal. Tympanic membrane is erythematous and retracted. Tympanic membrane is not perforated. A middle ear effusion is present.  Nose: Mucosal edema and rhinorrhea present. Right sinus exhibits maxillary sinus tenderness. Left sinus exhibits maxillary sinus tenderness.  Mouth/Throat: Oropharynx is clear and moist. No oral lesions. No oropharyngeal exudate.  Eyes: Right eye exhibits no discharge. Left eye exhibits no discharge. No scleral icterus.  Eyes minimally injected without exudate.  Neck: Neck supple.  Cardiovascular: Normal rate, regular rhythm and normal heart sounds.   Pulmonary/Chest: Effort normal and breath sounds normal. She has no wheezes. She has no rales.  Lymphadenopathy:    She has no cervical adenopathy.  Neurological:  She is alert and oriented to person, place, and time.  Skin: Skin is warm and dry.  Nursing note and vitals reviewed.   ED Course  Procedures (including critical care time) Labs Review Labs Reviewed - No data to display  Imaging Review No results found.   MDM   1. Acute recurrent maxillary sinusitis   2. Acute mucoid otitis media of left ear   3. Allergic rhinitis due to pollen    Treatment options discussed, as well as  risks, benefits, alternatives. Patient voiced understanding and agreement with the following plans:  Continue Nasonex New prescriptions: Omnicef 300 twice a day 10 days Prednisone 50 mg daily 5 days Other symptomatic care Follow-up with your primary care doctor in 5-7 days if not improving, or sooner if symptoms become worse. Precautions discussed. Red flags discussed. Questions invited and answered. Patient voiced understanding and agreement.     Jacqulyn Cane, MD 07/10/14 [phone]

## 2014-07-10 NOTE — ED Notes (Signed)
Left ear pain, popping, headache, hoarseness, drainage, neck pain x 2 days

## 2014-07-17 ENCOUNTER — Encounter: Payer: Self-pay | Admitting: Family Medicine

## 2014-07-17 ENCOUNTER — Encounter: Payer: BLUE CROSS/BLUE SHIELD | Admitting: Family Medicine

## 2014-07-17 ENCOUNTER — Ambulatory Visit (INDEPENDENT_AMBULATORY_CARE_PROVIDER_SITE_OTHER): Payer: BLUE CROSS/BLUE SHIELD | Admitting: Family Medicine

## 2014-07-17 VITALS — BP 125/53 | HR 72 | Ht 63.0 in | Wt 196.0 lb

## 2014-07-17 DIAGNOSIS — Z1231 Encounter for screening mammogram for malignant neoplasm of breast: Secondary | ICD-10-CM

## 2014-07-17 DIAGNOSIS — Z0000 Encounter for general adult medical examination without abnormal findings: Secondary | ICD-10-CM | POA: Diagnosis not present

## 2014-07-17 DIAGNOSIS — Z114 Encounter for screening for human immunodeficiency virus [HIV]: Secondary | ICD-10-CM

## 2014-07-17 DIAGNOSIS — Z3009 Encounter for other general counseling and advice on contraception: Secondary | ICD-10-CM | POA: Diagnosis not present

## 2014-07-17 LAB — POCT URINE PREGNANCY: PREG TEST UR: NEGATIVE

## 2014-07-17 MED ORDER — NYSTATIN 100000 UNIT/GM EX POWD: CUTANEOUS | Status: DC

## 2014-07-17 NOTE — Progress Notes (Signed)
  Subjective:     Candace Greene is a 44 y.o. female and is here for a comprehensive physical exam. The patient reports problems - would like a refill on powder for under breasts for fungual infection.  History   Social History  . Marital Status: Married    Spouse Name: N/A  . Number of Children: N/A  . Years of Education: N/A   Occupational History  . Not on file.   Social History Main Topics  . Smoking status: Never Smoker   . Smokeless tobacco: Never Used  . Alcohol Use: No  . Drug Use: No  . Sexual Activity: Not on file   Other Topics Concern  . Not on file   Social History Narrative   Health Maintenance  Topic Date Due  . INFLUENZA VACCINE  11/24/2014  . PAP SMEAR  05/03/2016  . TETANUS/TDAP  05/04/2023  . HIV Screening  Completed    The following portions of the patient's history were reviewed and updated as appropriate: allergies, current medications, past family history, past medical history, past social history, past surgical history and problem list.  Review of Systems A comprehensive review of systems was negative.   Objective:    BP 125/53 mmHg  Pulse 72  Ht 5\' 3"  (1.6 m)  Wt 196 lb (88.905 kg)  BMI 34.73 kg/m2  LMP 07/03/2014 General appearance: alert, cooperative and appears stated age Head: Normocephalic, without obvious abnormality, atraumatic Eyes: conj claer, EOMI PEERLA Ears: normal TM's and external ear canals both ears Nose: Nares normal. Septum midline. Mucosa normal. No drainage or sinus tenderness. Throat: lips, mucosa, and tongue normal; teeth and gums normal Neck: no adenopathy, no carotid bruit, no JVD, supple, symmetrical, trachea midline and thyroid not enlarged, symmetric, no tenderness/mass/nodules Back: symmetric, no curvature. ROM normal. No CVA tenderness. Lungs: clear to auscultation bilaterally Heart: regular rate and rhythm, S1, S2 normal, no murmur, click, rub or gallop Abdomen: soft, non-tender; bowel sounds normal; no  masses,  no organomegaly Extremities: extremities normal, atraumatic, no cyanosis or edema Pulses: 2+ and symmetric Skin: Skin color, texture, turgor normal. No rashes or lesions Lymph nodes: Cervical, supraclavicular, and axillary nodes normal. Neurologic: Alert and oriented X 3, normal strength and tone. Normal symmetric reflexes. Normal coordination and gait    Assessment:    Healthy female exam.      Plan:     See After Visit Summary for Counseling Recommendations  complete physical examination Keep up a regular exercise program and make sure you are eating a healthy diet Try to eat 4 servings of dairy a day, or if you are lactose intolerant take a calcium with vitamin D daily.  Your vaccines are up to date.

## 2014-07-17 NOTE — Addendum Note (Signed)
Addended by: Teddy Spike on: 07/17/2014 04:04 PM   Modules accepted: Orders

## 2014-08-07 ENCOUNTER — Ambulatory Visit (INDEPENDENT_AMBULATORY_CARE_PROVIDER_SITE_OTHER): Payer: BLUE CROSS/BLUE SHIELD | Admitting: Family Medicine

## 2014-08-07 VITALS — BP 116/69 | HR 63 | Wt 193.0 lb

## 2014-08-07 DIAGNOSIS — Z3042 Encounter for surveillance of injectable contraceptive: Secondary | ICD-10-CM

## 2014-08-07 MED ORDER — MEDROXYPROGESTERONE ACETATE 150 MG/ML IM SUSP
150.0000 mg | Freq: Once | INTRAMUSCULAR | Status: AC
  Administered 2014-08-07: 150 mg via INTRAMUSCULAR

## 2014-08-07 NOTE — Progress Notes (Signed)
   Subjective:    Patient ID: Candace Greene, female    DOB: 02-Nov-1970, 44 y.o.   MRN: [id number]  HPI Patient came into clinic to begin her Depo injections. Patient tested negative on a POCT UPT on 07/17/14, will retest in clinic today prior to administration.    Review of Systems     Objective:   Physical Exam        Assessment & Plan:  Upon negative retest on POCT UPT, Depo injection was administered in [place]. Patient tolerated well with no complications. Pt requested a reprinted copy of the letter written on 07/17/14 to her employer regarding bathroom breaks. This was reprinted and given to Pt. No further questions or concerns.

## 2014-09-01 ENCOUNTER — Ambulatory Visit: Payer: BLUE CROSS/BLUE SHIELD | Admitting: Family Medicine

## 2014-09-09 ENCOUNTER — Encounter: Payer: Self-pay | Admitting: Family Medicine

## 2014-09-09 ENCOUNTER — Ambulatory Visit (INDEPENDENT_AMBULATORY_CARE_PROVIDER_SITE_OTHER): Payer: BLUE CROSS/BLUE SHIELD | Admitting: Family Medicine

## 2014-09-09 VITALS — BP 140/83 | HR 57 | Ht 65.0 in | Wt 198.0 lb

## 2014-09-09 DIAGNOSIS — M7651 Patellar tendinitis, right knee: Secondary | ICD-10-CM | POA: Diagnosis not present

## 2014-09-09 DIAGNOSIS — B3789 Other sites of candidiasis: Secondary | ICD-10-CM

## 2014-09-09 MED ORDER — NYSTATIN 100000 UNIT/GM EX POWD
CUTANEOUS | Status: DC
Start: 2014-09-09 — End: 2014-10-21

## 2014-09-09 MED ORDER — DICLOFENAC SODIUM 2 % TD SOLN: TRANSDERMAL | Status: DC

## 2014-09-09 NOTE — Progress Notes (Signed)
CC: Candace Greene is a 44 y.o. female is here for Knee Pain   Subjective: HPI:  Right anterior knee pain localized just below the kneecap that has been present on a daily basis for one week now. She thinks it began on Sunday of last week without any trauma or overexertion. Symptoms have been mild in severity since onset. Worse with climbing stairs, getting up from a seated position or crouching down to look underneath the bed. There is been no swelling redness or warmth but she does feel like there is a knot inferior to the kneecap. She's tried over-the-counter anti-inflammatories without much benefit. No catching locking or giving way. Denies fevers, chills, nor joint pain elsewhere. Symptoms are persistent throughout the day absent at rest or when sitting.  Requesting refill on nystatin that she uses during the summer months when it's hot, a few times during the summer she'll develop candidiasis underneath the breasts that responds nicely to nystatin. Currently no symptoms or skin changes that she is concerned of  Review Of Systems Outlined In HPI  Past Medical History  Diagnosis Date  . Heel spur     right    No past surgical history on file. Family History  Problem Relation Age of Onset  . Stroke Father     <60  . Birth defects Other   . Hyperlipidemia Other   . Heart attack Father   . Breast cancer Maternal Grandmother   . Breast cancer Maternal Aunt   . Cancer      GF    History   Social History  . Marital Status: Married    Spouse Name: N/A  . Number of Children: N/A  . Years of Education: N/A   Occupational History  . Not on file.   Social History Main Topics  . Smoking status: Never Smoker   . Smokeless tobacco: Never Used  . Alcohol Use: No  . Drug Use: No  . Sexual Activity: Not on file   Other Topics Concern  . Not on file   Social History Narrative     Objective: BP 140/83 mmHg  Pulse 57  Ht 5\' 5"  (1.651 m)  Wt 198 lb (89.812 kg)  BMI 32.95  kg/m2  Vital signs reviewed. General: Alert and Oriented, No Acute Distress HEENT: Pupils equal, round, reactive to light. Conjunctivae clear.  External ears unremarkable.  Moist mucous membranes. Lungs: Clear and comfortable work of breathing, speaking in full sentences without accessory muscle use. Cardiac: Regular rate and rhythm.  Neuro: CN II-XII grossly intact, gait normal. Extremities: No peripheral edema.  Strong peripheral pulses. Right knee exam shows full-strength and range of motion. There is no swelling, redness, nor warmth overlying the knee.  No patellar crepitus. No patellar apprehension. No pain with palpation of the inferior patellar pole, however pain is reproduced with palpation of the patellar tendon.  No pain or laxity with valgus nor varus stress. Anterior drawer is negative. McMurray's negative. No popliteal space tenderness or palpable mass. No medial or lateral joint line tenderness to palpation. Mental Status: No depression, anxiety, nor agitation. Logical though process. Skin: Warm and dry. Assessment & Plan: Candace Greene was seen today for knee pain.  Diagnoses and all orders for this visit:  Candidiasis of breast Orders: -     nystatin (MYCOSTATIN/NYSTOP) 100000 UNIT/GM POWD; 1 application under each breast BID  Patellar tendinitis, right  Other orders -     Diclofenac Sodium (PENNSAID) 2 % SOLN; Apply one sample pack to  knee three times a day.   Breast candidiasis: Provided with refills of as needed nystatin Suspect patellar tendinitis or prepatellar bursitis is causing pain in her right knee. Start topical diclofenac and if beneficial and she would like refills all she needs to do is call me. She was also given a home rehabilitative exercise and stretching planned to be done on a daily basis for at least the next 2 weeks.  Return if symptoms worsen or fail to improve.

## 2014-09-15 ENCOUNTER — Ambulatory Visit (INDEPENDENT_AMBULATORY_CARE_PROVIDER_SITE_OTHER): Payer: BLUE CROSS/BLUE SHIELD | Admitting: Family Medicine

## 2014-09-15 ENCOUNTER — Encounter: Payer: Self-pay | Admitting: Family Medicine

## 2014-09-15 VITALS — BP 152/89 | HR 97 | Wt 196.0 lb

## 2014-09-15 DIAGNOSIS — S161XXA Strain of muscle, fascia and tendon at neck level, initial encounter: Secondary | ICD-10-CM

## 2014-09-15 DIAGNOSIS — S39012A Strain of muscle, fascia and tendon of lower back, initial encounter: Secondary | ICD-10-CM

## 2014-09-15 NOTE — Patient Instructions (Signed)
Recommend Aleve - 1 tabs twice a day for one week.
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
## 2014-09-24 ENCOUNTER — Telehealth: Payer: Self-pay | Admitting: *Deleted

## 2014-09-24 NOTE — Telephone Encounter (Signed)
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

## 2014-10-03 ENCOUNTER — Telehealth: Payer: Self-pay | Admitting: Family Medicine

## 2014-10-03 NOTE — Telephone Encounter (Signed)
Patient called this morning request to know if her paper work was ready or had it been faxed. I adv pt that their wasn't any paper work up front to pick up and I would ask the nurse about it. Pt stated she brought paper work 2 wks ago when she had an appointment. Pt states if someone can call her back on [phone] and if do not answer leave a vm. Thanks

## 2014-10-09 NOTE — Telephone Encounter (Signed)
Called pt and lvm informing her that I have not located the forms that she dropped off and suggested that she have them faxed to our office.Audelia Hives [place]

## 2014-10-21 ENCOUNTER — Ambulatory Visit: Payer: BLUE CROSS/BLUE SHIELD

## 2014-10-21 ENCOUNTER — Encounter: Payer: Self-pay | Admitting: Family Medicine

## 2014-10-21 ENCOUNTER — Ambulatory Visit (INDEPENDENT_AMBULATORY_CARE_PROVIDER_SITE_OTHER): Payer: BLUE CROSS/BLUE SHIELD | Admitting: Family Medicine

## 2014-10-21 ENCOUNTER — Ambulatory Visit (INDEPENDENT_AMBULATORY_CARE_PROVIDER_SITE_OTHER): Payer: BLUE CROSS/BLUE SHIELD

## 2014-10-21 VITALS — BP 120/76 | HR 62 | Wt 200.0 lb

## 2014-10-21 DIAGNOSIS — Z309 Encounter for contraceptive management, unspecified: Secondary | ICD-10-CM | POA: Diagnosis not present

## 2014-10-21 DIAGNOSIS — M67431 Ganglion, right wrist: Secondary | ICD-10-CM

## 2014-10-21 DIAGNOSIS — M25561 Pain in right knee: Secondary | ICD-10-CM | POA: Diagnosis not present

## 2014-10-21 MED ORDER — MEDROXYPROGESTERONE ACETATE 150 MG/ML IM SUSP
150.0000 mg | Freq: Once | INTRAMUSCULAR | Status: AC
  Administered 2014-10-21: 150 mg via INTRAMUSCULAR

## 2014-10-21 MED ORDER — MELOXICAM 15 MG PO TABS: 15.0000 mg | ORAL_TABLET | Freq: Every day | ORAL | Status: DC

## 2014-10-21 NOTE — Progress Notes (Signed)
CC: Candace Greene is a 44 y.o. female is here for right knee pain   Subjective: HPI:  Follow-up right knee pain: She states the pain doesn't seem to be getting much better. It does somewhat respond to the topical NSAID however only for a few hours. She still localizes the pain below the kneecap. It's worse with sitting for long period of time trying to get up out of the seated position going up or down stairs. Since I saw her last dictated out on her once but has not locked up or felt like it's catching. Denies any redness swelling warmth or any overlying skin changes the site of discomfort. Denies any recent trauma or overexertion.  1-2 weeks ago she developed a mass on the dorsal surface of the right wrist. 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 It seems to get better and is not as noticeable the more she rests. No intervention as of yet. She denies any joint pains elsewhere other than in the knee. She denies any recent trauma or overlying skin changes that the site other than the swelling.  She's due for a second Depo-Provera shot. When she got this back in April she said that she had untreatable bleeding for the first week after getting the injection but this was only present for 1 week. She tells me was quite heavy. She denies any shortness of breath rapid heartbeat or chest pain. She denies any genitourinary complaints at the current time but wonders if it's happened again after this injection. No abdominal pain or pelvic pain    Review Of Systems Outlined In HPI  Past Medical History  Diagnosis Date  . Heel spur     right    No past surgical history on file. Family History  Problem Relation Age of Onset  . Stroke Father     <60  . Birth defects Other   . Hyperlipidemia Other   . Heart attack Father   . Breast cancer Maternal Grandmother   . Breast cancer Maternal Aunt   . Cancer      GF    History   Social History  . Marital Status:  Married    Spouse Name: N/A  . Number of Children: N/A  . Years of Education: N/A   Occupational History  . Not on file.   Social History Main Topics  . Smoking status: Never Smoker   . Smokeless tobacco: Never Used  . Alcohol Use: No  . Drug Use: No  . Sexual Activity: Not on file   Other Topics Concern  . Not on file   Social History Narrative     Objective: BP 120/76 mmHg  Pulse 62  Wt 200 lb (90.719 kg)  General: Alert and Oriented, No Acute Distress HEENT: Pupils equal, round, reactive to light. Conjunctivae clear.  Moist mucous membranes Lungs: Clear comfortable work of breathing Cardiac: Regular rate and rhythm.  Extremities: No peripheral edema.  Strong peripheral pulses.Right knee exam shows full-strength and range of motion. There is no swelling, redness, nor warmth overlying the knee.  No patellar crepitus. No patellar apprehension. No pain with palpation of the inferior patellar pole.  No pain or laxity with valgus nor varus stress.  No popliteal space tenderness or palpable mass. No medial or lateral joint line tenderness to palpation. Pain is again localized to the insertion of the patellar tendon. She has full range of motion and strength in the right wrist  and there is a pea-sized slightly tender non-mobile mass on the dorsal surface of the wrist without any overlying skin changes Mental Status: No depression, anxiety, nor agitation. Skin: Warm and dry.  Assessment & Plan: Candace Greene was seen today for right knee pain.  Diagnoses and all orders for this visit:  Ganglion cyst of wrist, right Orders: -     meloxicam (MOBIC) 15 MG tablet; Take 1 tablet (15 mg total) by mouth daily.  Right knee pain Orders: -     DG Knee Complete 4 Views Right; Future  Encounter for contraceptive management, unspecified encounter Orders: -     medroxyPROGESTERone (DEPO-PROVERA) injection 150 mg; Inject 1 mL (150 mg total) into the muscle once.   Ganglion cyst: Discussed  benign nature of this and that it may improve with meloxicam. If it does not improve in a few weeks follow up with Dr. Darene Lamer and sports medicine to discuss more invasive interventions Right knee pain: I still think that the majority of her pain is coming from tendinitis however given the persistence of this despite home rehabilitation obtain x-ray to rule out more serious joint pathology Time was taken to answer questions regarding Depo-Provera and that it is a not uncommon to have irregular bleeding on this medication especially in the first few weeks of taking the first injection. This should be less severe after getting subsequent injections, she like to have a second injection today.  25 minutes spent face-to-face during visit today of which at least 50% was counseling or coordinating care regarding: 1. Ganglion cyst of wrist, right   2. Right knee pain   3. Encounter for contraceptive management, unspecified encounter        Return for September 13th-27th for Depo Shot.

## 2014-10-22 ENCOUNTER — Telehealth: Payer: Self-pay | Admitting: Family Medicine

## 2014-10-22 DIAGNOSIS — M7651 Patellar tendinitis, right knee: Secondary | ICD-10-CM

## 2014-10-22 NOTE — Telephone Encounter (Signed)
Seth Bake, Will you please let patient know that her xray was normal and reassuring that there is no bony abnormality with her knee.  I still think her pain is coming from tendinitis and would recommend that she visit with a physical therapist to go over some additional stretches and exercises to help rehab the knee.  Referral has been placed.

## 2014-10-22 NOTE — Telephone Encounter (Signed)
Pt notified via message left on cell

## 2014-10-22 NOTE — Telephone Encounter (Signed)
Pt.notified

## 2014-10-24 ENCOUNTER — Ambulatory Visit: Payer: BLUE CROSS/BLUE SHIELD

## 2014-11-26 ENCOUNTER — Ambulatory Visit: Payer: BLUE CROSS/BLUE SHIELD | Admitting: Family Medicine

## 2014-11-28 ENCOUNTER — Ambulatory Visit (INDEPENDENT_AMBULATORY_CARE_PROVIDER_SITE_OTHER): Payer: BLUE CROSS/BLUE SHIELD | Admitting: Family Medicine

## 2014-11-28 ENCOUNTER — Encounter: Payer: Self-pay | Admitting: Family Medicine

## 2014-11-28 VITALS — BP 144/95 | HR 64 | Wt 201.0 lb

## 2014-11-28 DIAGNOSIS — N926 Irregular menstruation, unspecified: Secondary | ICD-10-CM | POA: Diagnosis not present

## 2014-11-28 DIAGNOSIS — M778 Other enthesopathies, not elsewhere classified: Secondary | ICD-10-CM | POA: Diagnosis not present

## 2014-11-28 MED ORDER — DICLOFENAC SODIUM 1 % TD GEL: 2.0000 g | Freq: Four times a day (QID) | TRANSDERMAL | Status: DC

## 2014-11-28 MED ORDER — MEDROXYPROGESTERONE ACETATE 5 MG PO TABS: 5.0000 mg | ORAL_TABLET | Freq: Every day | ORAL | Status: DC

## 2014-11-28 NOTE — Assessment & Plan Note (Signed)
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 Trial of NSAIDs topically and orally. If not better would consider injection. Strongly recommend patient to consider finding a different line of work.

## 2014-11-28 NOTE — Patient Instructions (Signed)
Thank you for coming in today. 1) bleeding: Take Provera for 10 days then stop. He should have a normal period. 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  Tendinitis Tendinitis is swelling and inflammation of the tendons. Tendons are band-like tissues that connect muscle to bone. Tendinitis commonly occurs in the:   Shoulders (rotator cuff).  Heels (Achilles tendon).  Elbows (triceps tendon). CAUSES Tendinitis is usually caused by overusing the tendon, muscles, and joints involved. When the tissue surrounding a tendon (synovium) becomes inflamed, it is called tenosynovitis. Tendinitis commonly develops in people whose jobs require repetitive motions. SYMPTOMS  Pain.  Tenderness.  Mild swelling. DIAGNOSIS Tendinitis is usually diagnosed by physical exam. Your health care provider may also order X-rays or other imaging tests. TREATMENT Your health care provider may recommend certain medicines or exercises for your treatment. HOME CARE INSTRUCTIONS   Use a sling or splint for as long as directed by your health care provider until the pain decreases.  Put ice on the injured area.  Put ice in a plastic bag.  Place a towel between your skin and the bag.  Leave the ice on for 15-20 minutes, 3-4 times a day, or as directed by your health care provider.  Avoid using the limb while the tendon is painful. Perform gentle range of motion exercises only as directed by your health care provider. Stop exercises if pain or discomfort increase, unless directed otherwise by your health care provider.  Only take over-the-counter or prescription medicines for pain, discomfort, or fever as directed by your health care provider. SEEK MEDICAL CARE IF:   Your pain and swelling increase.  You develop new, unexplained symptoms, especially increased numbness in the hands. MAKE  SURE YOU:   Understand these instructions.  Will watch your condition.  Will get help right away if you are not doing well or get worse. Document Released: 04/08/2000 Document Revised: 08/26/2013 Document Reviewed: 06/28/2010 [facility] Patient Information 2015 [place], [place]. This information is not intended to replace advice given to you by your health care provider. Make sure you discuss any questions you have with your health care provider.

## 2014-11-28 NOTE — Progress Notes (Signed)
Candace Greene is a 44 y.o. female who presents to [facility]  today for   1) spotting.  Patient has Depo-Provera for birth control. She's been taking this medicine for about 5 months. She notes continued bothersome spotting. This occurs throughout the duration of her cycle. She denies any pelvic pain fevers chills nausea vomiting or diarrhea.  2) right wrist pain. Patient has a painful bump on the dorsal aspect of her right wrist and hand. This is been present for several months and is slowly worsening. 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 She notes significant wrist soreness and pain. She denies any numbness weakness or radiating pain.   Past Medical History  Diagnosis Date  . Heel spur     right   No past surgical history on file. History  Substance Use Topics  . Smoking status: Never Smoker   . Smokeless tobacco: Never Used  . Alcohol Use: No   ROS as above Medications: Current Outpatient Prescriptions  Medication Sig Dispense Refill  . meloxicam (MOBIC) 15 MG tablet Take 1 tablet (15 mg total) by mouth daily. 30 tablet 0  . diclofenac sodium (VOLTAREN) 1 % GEL Apply 2 g topically 4 (four) times daily. 100 g 6  . medroxyPROGESTERone (PROVERA) 5 MG tablet Take 1 tablet (5 mg total) by mouth daily. 10 tablet 0  . [DISCONTINUED] fluticasone (FLONASE) 50 MCG/ACT nasal spray Place 2 sprays in each nostril once daily for allergy symptoms 16 g 1   No current facility-administered medications for this visit.   No Known Allergies   Exam:  BP 144/95 mmHg  Pulse 64  Wt 201 lb (91.173 kg) Gen: Well NAD HEENT: EOMI,  MMM Lungs: Normal work of breathing. CTABL Heart: RRR no MRG Abd: NABS, Soft. Nondistended, Nontender Exts: Brisk capillary refill, warm and well perfused.  Right hand normal-appearing no significant swelling or ecchymosis. Tender firm nodule dorsal hand  at the triquetrum area. Pain with wrist extension and finger extension.  Limited musculoskeletal ultrasound right hand and wrist. Fourth dorsal compartment visualized. No ganglion cyst. Mild tenosynovitis present. Nodule is a bony prominence.  No results found for this or any previous visit (from the past 24 hour(s)). No results found.   Please see individual assessment and plan sections.

## 2014-11-28 NOTE — Assessment & Plan Note (Signed)
Trial of 10 days of Provera. If not better switch to a different type of contraception.

## 2014-12-17 ENCOUNTER — Encounter: Payer: Self-pay | Admitting: Family Medicine

## 2014-12-17 ENCOUNTER — Ambulatory Visit (INDEPENDENT_AMBULATORY_CARE_PROVIDER_SITE_OTHER): Payer: BLUE CROSS/BLUE SHIELD | Admitting: Family Medicine

## 2014-12-17 VITALS — BP 129/77 | HR 56 | Temp 98.0°F | Wt 196.0 lb

## 2014-12-17 DIAGNOSIS — N926 Irregular menstruation, unspecified: Secondary | ICD-10-CM

## 2014-12-17 DIAGNOSIS — Z630 Problems in relationship with spouse or partner: Secondary | ICD-10-CM | POA: Diagnosis not present

## 2014-12-17 DIAGNOSIS — N760 Acute vaginitis: Secondary | ICD-10-CM

## 2014-12-17 DIAGNOSIS — M778 Other enthesopathies, not elsewhere classified: Secondary | ICD-10-CM

## 2014-12-17 MED ORDER — NORETHINDRONE-MESTRANOL 1-50 MG-MCG PO TABS: 1.0000 | ORAL_TABLET | Freq: Every day | ORAL | Status: DC

## 2014-12-17 NOTE — Progress Notes (Signed)
Subjective:    Patient ID: Candace Greene, female    DOB: [birth year], 44 y.o.   MRN: [id number]  HPI Wrist tendonitis - She says her wrist is some better.  Says has been off work for a week and has been better when she is better. She still says it's painful with extension. Not quite as painful with flexion. She does a lot of repetitive work with that wrist.  Says sometime will have a odor right before her cycle starts but now notices the odor in between the cycle. Has been having really heavy periods and passing blood clots.  She has been wearing pad every day because she doesn't know when she's going to bleed.. She has been heavy cramps as well. She will bleed for several days and then stop for a few days and then start again. She's currently on the Depo-Provera injection is due for her next one in about 4 weeks.  Having marriage issues and would like referral to marriage counseling. She did not go into details today.  Review of Systems  BP 129/77 mmHg  Pulse 56  Temp(Src) 98 F (36.7 C) (Oral)  Wt 196 lb (88.905 kg)  SpO2 99%    No Known Allergies  Past Medical History  Diagnosis Date  . Heel spur     right    History reviewed. No pertinent past surgical history.  Social History   Social History  . Marital Status: Married    Spouse Name: N/A  . Number of Children: N/A  . Years of Education: N/A   Occupational History  . Not on file.   Social History Main Topics  . Smoking status: Never Smoker   . Smokeless tobacco: Never Used  . Alcohol Use: No  . Drug Use: No  . Sexual Activity: Not on file   Other Topics Concern  . Not on file   Social History Narrative    Family History  Problem Relation Age of Onset  . Stroke Father     <60  . Birth defects Other   . Hyperlipidemia Other   . Heart attack Father   . Breast cancer Maternal Grandmother   . Breast cancer Maternal Aunt   . Cancer      GF    Outpatient Encounter Prescriptions as of 12/17/2014   Medication Sig  . diclofenac sodium (VOLTAREN) 1 % GEL Apply 2 g topically 4 (four) times daily.  . meloxicam (MOBIC) 15 MG tablet Take 1 tablet (15 mg total) by mouth daily.  . [DISCONTINUED] medroxyPROGESTERone (PROVERA) 5 MG tablet Take 1 tablet (5 mg total) by mouth daily.  . Norethindrone-Mestranol (NECON) 1-50 MG-MCG tablet Take 1 tablet by mouth daily.   No facility-administered encounter medications on file as of 12/17/2014.          Objective:   Physical Exam  Constitutional: She is oriented to person, place, and time. She appears well-developed and well-nourished.  HENT:  Head: Normocephalic and atraumatic.  Cardiovascular: Normal rate, regular rhythm and normal heart sounds.   Pulmonary/Chest: Effort normal and breath sounds normal.  Abdominal: Soft. Bowel sounds are normal. She exhibits no distension and no mass. There is no tenderness. There is no rebound and no guarding.  Musculoskeletal: She exhibits no edema.  Neurological: She is alert and oriented to person, place, and time.  Skin: Skin is warm and dry.  Psychiatric: She has a normal mood and affect. Her behavior is normal.  Assessment & Plan:  Wrist tendonitis- some improvement. Rest over this last week of being off work is really helped. We discussed that if at some point it feels like it's getting worse she can certainly get back in with Dr. [name] to have an injection done if she would like. Could consider wearing a brace for part of her work shift but she's not sure they will allow it but she can certainly check into that.  menorrhagia - discussed treatment options. I really think we should try putting her back on birth control but also has estrogen. She is due for her next upper Provera shot in about 4 weeks. Okay to go ahead and start birth control on Sunday. Will put her on Ortho-Novum. I'll see her back in about 2 and half months to make sure that she's doing well on it.  Vaginitis/vaginal  odor-will do a wet prep as well as a urine GC and chlamydia test.  Marital dysfunction - She would like to work on marriage counseling.  She's having some issues with her marriage right now. She said she tried to call couple places but they did not take her [facility] to notify could assist with that.

## 2014-12-18 LAB — WET PREP, GENITAL
TRICH WET PREP: NONE SEEN
WBC, Wet Prep HPF POC: NONE SEEN
Yeast Wet Prep HPF POC: NONE SEEN

## 2014-12-18 LAB — GC/CHLAMYDIA PROBE AMP, URINE
CHLAMYDIA, SWAB/URINE, PCR: NEGATIVE
GC Probe Amp, Urine: NEGATIVE

## 2014-12-18 MED ORDER — METRONIDAZOLE 500 MG PO TABS: 500.0000 mg | ORAL_TABLET | Freq: Two times a day (BID) | ORAL | Status: DC

## 2014-12-18 NOTE — Addendum Note (Signed)
Addended by: Beatrice Lecher D on: 12/18/2014 08:07 AM   Modules accepted: Orders

## 2015-01-12 ENCOUNTER — Emergency Department (INDEPENDENT_AMBULATORY_CARE_PROVIDER_SITE_OTHER): Payer: BLUE CROSS/BLUE SHIELD

## 2015-01-12 ENCOUNTER — Emergency Department (INDEPENDENT_AMBULATORY_CARE_PROVIDER_SITE_OTHER)
Admission: EM | Admit: 2015-01-12 | Discharge: 2015-01-12 | Disposition: A | Discharge status: Home / Self Care | Payer: BLUE CROSS/BLUE SHIELD | Source: Home / Self Care | Attending: Family Medicine | Admitting: Family Medicine

## 2015-01-12 ENCOUNTER — Encounter: Payer: Self-pay | Admitting: Emergency Medicine

## 2015-01-12 ENCOUNTER — Ambulatory Visit: Payer: BLUE CROSS/BLUE SHIELD | Admitting: Family Medicine

## 2015-01-12 DIAGNOSIS — M7732 Calcaneal spur, left foot: Secondary | ICD-10-CM

## 2015-01-12 DIAGNOSIS — S99922A Unspecified injury of left foot, initial encounter: Secondary | ICD-10-CM

## 2015-01-12 NOTE — ED Provider Notes (Signed)
CSN: [id number]     Arrival date & time 01/12/15  1640 History   First MD Initiated Contact with Patient 01/12/15 1648     Chief Complaint  Patient presents with  . Foot Pain   (Consider location/radiation/quality/duration/timing/severity/associated sxs/prior Treatment) HPI Pt is a 44yo female presenting to [facility] with c/o Left foot pain after closing it in a car door 3 days ago.  Pain is aching, sore, throbbing, 4/10, worse with palpation and ambulation.  Minimal relief with ice, elevation, tylenol and espom soaks.  Pt states she still has swelling. Denies any other injuries.  Past Medical History  Diagnosis Date  . Heel spur     right   History reviewed. No pertinent past surgical history. Family History  Problem Relation Age of Onset  . Stroke Father     <60  . Birth defects Other   . Hyperlipidemia Other   . Heart attack Father   . Breast cancer Maternal Grandmother   . Breast cancer Maternal Aunt   . Cancer      GF   Social History  Substance Use Topics  . Smoking status: Never Smoker   . Smokeless tobacco: Never Used  . Alcohol Use: No   OB History    No data available     Review of Systems  Musculoskeletal: Positive for myalgias, joint swelling and arthralgias.       Left foot  Skin: Positive for color change. Negative for wound.  Neurological: Negative for weakness and numbness.    Allergies  Review of patient's allergies indicates no known allergies.  Home Medications   Prior to Admission medications   Medication Sig Start Date End Date Taking? Authorizing Provider  diclofenac sodium (VOLTAREN) 1 % GEL Apply 2 g topically 4 (four) times daily. 11/28/14   Gregor Hams, MD  meloxicam (MOBIC) 15 MG tablet Take 1 tablet (15 mg total) by mouth daily. 10/21/14   Sean Hommel, DO  metroNIDAZOLE (FLAGYL) 500 MG tablet Take 1 tablet (500 mg total) by mouth 2 (two) times daily. 12/18/14   Hali Marry, MD  Norethindrone-Mestranol [facility]) 1-50 MG-MCG tablet Take  1 tablet by mouth daily. 12/17/14   Hali Marry, MD   Meds Ordered and Administered this Visit  Medications - No data to display  BP 125/83 mmHg  Pulse 80  Temp(Src) 98.4 F (36.9 C) (Oral)  Resp 16  Ht 5\' 2"  (1.575 m)  Wt 200 lb (90.719 kg)  BMI 36.57 kg/m2  SpO2 100% No data found.   Physical Exam  Constitutional: She is oriented to person, place, and time. She appears well-developed and well-nourished.  HENT:  Head: Normocephalic and atraumatic.  Eyes: EOM are normal.  Neck: Normal range of motion.  Cardiovascular: Normal rate.   Pulses:      Dorsalis pedis pulses are 2+ on the left side.  Left foot: cap refill < 3 seconds  Pulmonary/Chest: Effort normal.  Musculoskeletal: Normal range of motion. She exhibits edema and tenderness.       Feet:  Left foot: mild toe moderate edema to dorsal aspect, tender to medial dorsal and lateral part of forefoot.  Full ROM ankle and all toes. Calf is soft, non-tender.  Neurological: She is alert and oriented to person, place, and time.  Left foot: normal sensation  Skin: Skin is warm and dry.  Left foot: skin in tact. Mild ecchymosis to medial aspect of foot. No erythema or warmth.  Psychiatric: She has a normal mood  and affect. Her behavior is normal.  Nursing note and vitals reviewed.   ED Course  Procedures (including critical care time)  Labs Review Labs Reviewed - No data to display  Imaging Review Dg Foot Complete Left  01/12/2015   CLINICAL DATA:  Acute left foot pain following lower injury 2 days ago. Initial encounter.  EXAM: LEFT FOOT - COMPLETE 3+ VIEW  COMPARISON:  None.  FINDINGS: Soft tissue swelling is noted.  There is no evidence of fracture, subluxation or dislocation.  A small calcaneal spur is present.  IMPRESSION: Soft tissue swelling without acute bony abnormality.  Calcaneal spur.   Electronically Signed   By: Margarette Canada M.D.   On: 01/12/2015 17:23       MDM   1. Foot injury, left, initial  encounter   2. Heel spur, left    Pt c/o continued Left foot pain, swelling and bruising after having it closed in a car door 3 days ago. Foot- PMS is in tact Plain films: negative for fracture or dislocation. Soft tissue swelling and calcaneal spur noted Pt already aware of heel spur. Offered crutches and stronger pain medication, pt declined. Will continue R.I.C.E treatment at home with ibuprofen. Advised to f/u with PCP in 1-2 weeks if not improving. Patient verbalized understanding and agreement with treatment plan.     Noland Fordyce, PA-C 01/12/15 1737

## 2015-01-12 NOTE — ED Notes (Signed)
Reports car door closed on left foot 3 days ago; has used ice, elevation, epsom soaks and tylenol but foot is still edematous and hurting.

## 2015-01-12 NOTE — Discharge Instructions (Signed)
You may take 600-800mg  ibuprofen every 6-8 hours for pain and swelling. See below for further instructions.

## 2015-01-16 ENCOUNTER — Ambulatory Visit: Payer: BLUE CROSS/BLUE SHIELD

## 2015-01-22 ENCOUNTER — Emergency Department (INDEPENDENT_AMBULATORY_CARE_PROVIDER_SITE_OTHER)
Admission: EM | Admit: 2015-01-22 | Discharge: 2015-01-22 | Disposition: A | Discharge status: Home / Self Care | Payer: BLUE CROSS/BLUE SHIELD | Source: Home / Self Care | Attending: Family Medicine | Admitting: Family Medicine

## 2015-01-22 ENCOUNTER — Emergency Department (INDEPENDENT_AMBULATORY_CARE_PROVIDER_SITE_OTHER): Payer: BLUE CROSS/BLUE SHIELD

## 2015-01-22 ENCOUNTER — Encounter: Payer: Self-pay | Admitting: *Deleted

## 2015-01-22 DIAGNOSIS — R0989 Other specified symptoms and signs involving the circulatory and respiratory systems: Secondary | ICD-10-CM | POA: Diagnosis not present

## 2015-01-22 DIAGNOSIS — J069 Acute upper respiratory infection, unspecified: Secondary | ICD-10-CM

## 2015-01-22 DIAGNOSIS — R0789 Other chest pain: Secondary | ICD-10-CM | POA: Diagnosis not present

## 2015-01-22 DIAGNOSIS — R05 Cough: Secondary | ICD-10-CM | POA: Diagnosis not present

## 2015-01-22 DIAGNOSIS — M545 Low back pain: Secondary | ICD-10-CM | POA: Diagnosis not present

## 2015-01-22 MED ORDER — PREDNISONE 20 MG PO TABS: ORAL_TABLET | ORAL | Status: DC

## 2015-01-22 MED ORDER — BENZONATATE 100 MG PO CAPS: 100.0000 mg | ORAL_CAPSULE | Freq: Three times a day (TID) | ORAL | Status: DC

## 2015-01-22 MED ORDER — AZITHROMYCIN 250 MG PO TABS: 250.0000 mg | ORAL_TABLET | Freq: Every day | ORAL | Status: DC

## 2015-01-22 NOTE — Discharge Instructions (Signed)
You may continue over the counter treatments for 3-4 more days along with prednisone and tessalon, if symptoms persist, or worsen, please start the antibiotic, Azithromycin.  You may continue taking over the counter Mucinex.  Be sure to drink a large glass of water with each dose to help keep mucous thin.  You may also take acetaminophen and ibuprofen for fever at pain.   Be sure to get at least 8 hours of sleep at night while sick.  If you develop worsening chest pain, shortness of breath, unable to keep down fluids, or other new concerning symptoms develop, please call 911 or go to closest emergency department for further evaluation and treatment.  Please follow up with your primary care provider in 7-10 days if symptoms not improving, sooner if worsening. See below for further instructions.

## 2015-01-22 NOTE — ED Provider Notes (Signed)
CSN: [id number]     Arrival date & time 01/22/15  1751 History   First MD Initiated Contact with Patient 01/22/15 1805     Chief Complaint  Patient presents with  . Cough   (Consider location/radiation/quality/duration/timing/severity/associated sxs/prior Treatment) HPI  Pt is a 44yo female presenting to [facility] with c/o gradually worsening cough, congsetion, bilateral ear pain, scratchy throat, and chills that started 4-5 days ago while [place] was [place] the beach.  Pt states when she came in from outside it was chilly, the next morning she woke with a cough.  Denies sick contacts. Cough became so severe earlier today she almost had an episode of post-tussive vomiting.  Cough is moderately intermittent, productive. Minimal relief with Mucinex and Alka Seltzer cough and congestion. Denies hx of asthma and does not recall using an inhaler when she is sick but does state she is concerned because when she gets bronchitis it normally turns into pneumonia.    Past Medical History  Diagnosis Date  . Heel spur     right   History reviewed. No pertinent past surgical history. Family History  Problem Relation Age of Onset  . Stroke Father     <60  . Birth defects Other   . Hyperlipidemia Other   . Heart attack Father   . Breast cancer Maternal Grandmother   . Breast cancer Maternal Aunt   . Cancer      GF   Social History  Substance Use Topics  . Smoking status: Never Smoker   . Smokeless tobacco: Never Used  . Alcohol Use: No   OB History    No data available     Review of Systems  Constitutional: Positive for chills. Negative for fever.  HENT: Positive for congestion, ear pain and sore throat ( scratchy). Negative for trouble swallowing and voice change.   Respiratory: Positive for cough. Negative for shortness of breath.   Cardiovascular: Negative for chest pain and palpitations.  Gastrointestinal: Negative for nausea, vomiting, abdominal pain and diarrhea.  Musculoskeletal: Negative for  myalgias, back pain and arthralgias.  Skin: Negative for rash.  All other systems reviewed and are negative.   Allergies  Review of patient's allergies indicates no known allergies.  Home Medications   Prior to Admission medications   Medication Sig Start Date End Date Taking? Authorizing Yonna Alwin  azithromycin (ZITHROMAX) 250 MG tablet Take 1 tablet (250 mg total) by mouth daily. Take first 2 tablets together, then 1 every day until finished. 01/22/15   Noland Fordyce, PA-C  benzonatate (TESSALON) 100 MG capsule Take 1 capsule (100 mg total) by mouth every 8 (eight) hours. 01/22/15   Noland Fordyce, PA-C  diclofenac sodium (VOLTAREN) 1 % GEL Apply 2 g topically 4 (four) times daily. 11/28/14   Gregor Hams, MD  Norethindrone-Mestranol [facility]) 1-50 MG-MCG tablet Take 1 tablet by mouth daily. 12/17/14   Hali Marry, MD  predniSONE (DELTASONE) 20 MG tablet 2 tabs po daily x 3 days 01/22/15   Noland Fordyce, PA-C   Meds Ordered and Administered this Visit  Medications - No data to display  BP 160/90 mmHg  Pulse 73  Temp(Src) 98.3 F (36.8 C) (Oral)  Resp 16  Wt 199 lb (90.266 kg)  SpO2 99% No data found.   Physical Exam  Constitutional: She appears well-developed and well-nourished. No distress.  HENT:  Head: Normocephalic and atraumatic.  Right Ear: Hearing, tympanic membrane, external ear and ear canal normal.  Left Ear: Hearing, tympanic membrane, external  ear and ear canal normal.  Nose: Mucosal edema present. Right sinus exhibits no maxillary sinus tenderness and no frontal sinus tenderness. Left sinus exhibits no maxillary sinus tenderness and no frontal sinus tenderness.  Mouth/Throat: Uvula is midline, oropharynx is clear and moist and mucous membranes are normal.  Eyes: Conjunctivae are normal. No scleral icterus.  Neck: Normal range of motion. Neck supple.  Hoarse voice  Cardiovascular: Normal rate, regular rhythm and normal heart sounds.   Pulmonary/Chest: Effort  normal and breath sounds normal. No stridor. No respiratory distress. She has no wheezes. She has no rales. She exhibits no tenderness.  Abdominal: She exhibits no distension. There is no tenderness.  Musculoskeletal: Normal range of motion.  Neurological: She is alert.  Skin: Skin is warm and dry. She is not diaphoretic.  Nursing note and vitals reviewed.   ED Course  Procedures (including critical care time)  Labs Review Labs Reviewed - No data to display  Imaging Review Dg Chest 2 View  01/22/2015   CLINICAL DATA:  Cough, congestion and upper chest/ back pain for 6 days. Nonsmoker. Initial encounter.  EXAM: CHEST  2 VIEW  COMPARISON:  03/04/2011 radiographs.  FINDINGS: The heart size and mediastinal contours are normal. The lungs are clear. There is no pleural effusion or pneumothorax. No acute osseous findings are identified.  IMPRESSION: Resolved left basilar infiltrate. No active cardiopulmonary process.   Electronically Signed   By: Richardean Sale M.D.   On: 01/22/2015 18:25      MDM   1. Acute upper respiratory infection    Pt c/o cough and congestion for 4 days and chest tightness. Pt concerned she has bronchitis that will turn into pneumonia. No hx of asthma. Denies SOB. No evidence of respiratory distress on exam.  Lungs: CTAB but pt c/o chest tightness, will get CXR to r/o underlying infection, effusion or pneumothorax.  CXR: no active cardiopulmonary disease. Reassured pt no evidence of bronchitis or pneumonia at this time. Encouraged pt to continue symptomatic treatment. Rx: prednisone and tessalon. Prescription for azithromycin given, advised to start taking in 3-4 days if not improving or if symptoms worsen. Home care instructions provided. F/u with PCP in 1 week if not improving. Patient verbalized understanding and agreement with treatment plan.     Noland Fordyce, PA-C 01/22/15 [phone]

## 2015-01-22 NOTE — ED Notes (Signed)
Pt c/o congestion, cough, ear pain x 4 days. Chills more recently. Alka seltzer cough and congestion otc.

## 2015-02-06 ENCOUNTER — Ambulatory Visit (INDEPENDENT_AMBULATORY_CARE_PROVIDER_SITE_OTHER): Payer: BLUE CROSS/BLUE SHIELD

## 2015-02-06 ENCOUNTER — Ambulatory Visit (INDEPENDENT_AMBULATORY_CARE_PROVIDER_SITE_OTHER): Payer: BLUE CROSS/BLUE SHIELD | Admitting: Osteopathic Medicine

## 2015-02-06 ENCOUNTER — Encounter: Payer: Self-pay | Admitting: Osteopathic Medicine

## 2015-02-06 VITALS — BP 131/90 | HR 60 | Temp 98.5°F | Wt 201.0 lb

## 2015-02-06 DIAGNOSIS — R51 Headache: Secondary | ICD-10-CM

## 2015-02-06 DIAGNOSIS — R059 Cough, unspecified: Secondary | ICD-10-CM

## 2015-02-06 DIAGNOSIS — B379 Candidiasis, unspecified: Secondary | ICD-10-CM | POA: Diagnosis not present

## 2015-02-06 DIAGNOSIS — J069 Acute upper respiratory infection, unspecified: Secondary | ICD-10-CM | POA: Diagnosis not present

## 2015-02-06 DIAGNOSIS — R05 Cough: Secondary | ICD-10-CM | POA: Diagnosis not present

## 2015-02-06 DIAGNOSIS — R0989 Other specified symptoms and signs involving the circulatory and respiratory systems: Secondary | ICD-10-CM

## 2015-02-06 DIAGNOSIS — J019 Acute sinusitis, unspecified: Secondary | ICD-10-CM

## 2015-02-06 MED ORDER — AMOXICILLIN-POT CLAVULANATE 875-125 MG PO TABS: 1.0000 | ORAL_TABLET | Freq: Two times a day (BID) | ORAL | Status: DC

## 2015-02-06 MED ORDER — FLUCONAZOLE 150 MG PO TABS: 150.0000 mg | ORAL_TABLET | Freq: Once | ORAL | Status: DC

## 2015-02-06 MED ORDER — IPRATROPIUM BROMIDE 0.03 % NA SOLN: 2.0000 | Freq: Two times a day (BID) | NASAL | Status: DC

## 2015-02-06 MED ORDER — BENZONATATE 200 MG PO CAPS: 200.0000 mg | ORAL_CAPSULE | Freq: Three times a day (TID) | ORAL | Status: DC | PRN

## 2015-02-06 NOTE — Patient Instructions (Signed)
Can take antihistamines to help congestion, can take ibuprofen/tylenol for aches and pains If no better in a week, come back to clinic and we will reevaluate

## 2015-02-06 NOTE — Progress Notes (Signed)
HPI: Candace Greene is a 44 y.o. female who presents to [place]  today for chief complaint of:  Chief Complaint  Patient presents with  . Acute Visit    congestion & facial pain OTC: Mucinex-congestion & Tylenol   coughing . Location: headache on L side onset "this week." . Severity: mild to moderate . Duration: few weeks . Context: (+) secondhand smoke exposure.  . Modifying factors: worse with walking outside, worse with cold air starts, having dry cough. Already treated by urgent care with z-pack and prednisone whichhelped a bit but now she is feeling headache and still coughing.  . Assoc signs/symptoms: no SOB, no vision changes, no fever/chills   Past medical, social and family history reviewed: Past Medical History  Diagnosis Date  . Heel spur     right   No past surgical history on file. Social History  Substance Use Topics  . Smoking status: Never Smoker   . Smokeless tobacco: Never Used  . Alcohol Use: No   Family History  Problem Relation Age of Onset  . Stroke Father     <60  . Birth defects Other   . Hyperlipidemia Other   . Heart attack Father   . Breast cancer Maternal Grandmother   . Breast cancer Maternal Aunt   . Cancer      GF    Current Outpatient Prescriptions  Medication Sig Dispense Refill  . benzonatate (TESSALON) 100 MG capsule Take 1 capsule (100 mg total) by mouth every 8 (eight) hours. 21 capsule 0  . diclofenac sodium (VOLTAREN) 1 % GEL Apply 2 g topically 4 (four) times daily. 100 g 6  . Norethindrone-Mestranol (NECON) 1-50 MG-MCG tablet Take 1 tablet by mouth daily. 1 Package 11  . predniSONE (DELTASONE) 20 MG tablet 2 tabs po daily x 3 days 6 tablet 0  . [DISCONTINUED] fluticasone (FLONASE) 50 MCG/ACT nasal spray Place 2 sprays in each nostril once daily for allergy symptoms 16 g 1   No current facility-administered medications for this visit.   No Known Allergies    Review of  Systems: CONSTITUTIONAL: Neg fever/chills, no unintentional weight changes HEAD/EYES/EARS/NOSE/THROAT: No headache/vision change or hearing change, mild sore throat, (+) sinus pressure on L CARDIAC: No chest pain/pressure/palpitations, no orthopnea RESPIRATORY: (+) cough, no shortness of breath/wheeze GASTROINTESTINAL: No nausea/vomiting/abdominal pain/blood in stool/diarrhea/constipation MUSCULOSKELETAL: (+) myalgia/arthralgia   Exam:  BP 131/90 mmHg  Pulse 60  Temp(Src) 98.5 F (36.9 C)  Wt 201 lb (91.173 kg)  SpO2 96% Constitutional: VSS, see above. General Appearance: alert, well-developed, well-nourished, NAD Eyes: Normal lids and conjunctive, non-icteric sclera, PERRLA Ears, Nose, Mouth, Throat: Normal external inspection ears/nares/mouth/lips/gums, Normal TM bilaterally, MMM, posterior pharynx without erythema/exudate Neck: No masses, trachea midline. No thyroid enlargement/tenderness/mass appreciated Respiratory: Normal respiratory effort. no wheeze/rhonchi/rales Cardiovascular: S1/S2 normal, no murmur/rub/gallop auscultated. RRR.  No results found for this or any previous visit (from the past 72 hour(s)).  01/22/15 CXR  CLINICAL DATA: Cough, congestion and upper chest/ back pain for 6 days. Nonsmoker. Initial encounter.  EXAM: CHEST 2 VIEW  COMPARISON: 03/04/2011 radiographs.  FINDINGS: The heart size and mediastinal contours are normal. The lungs are clear. There is no pleural effusion or pneumothorax. No acute osseous findings are identified.  IMPRESSION: Resolved left basilar infiltrate. No active cardiopulmonary process.    CXR today personally reviewed - appears stable from last imaging, no consolidation, no active disease - await radiology overread, pt informed of plan  ASSESSMENT/PLAN:  Cough -  Plan: DG Chest 2 View, benzonatate (TESSALON) 200 MG capsule  Acute upper respiratory infection  Acute rhinosinusitis - Plan: amoxicillin-clavulanate  (AUGMENTIN) 875-125 MG tablet, ipratropium (ATROVENT) 0.03 % nasal spray  Yeast infection - Plan: fluconazole (DIFLUCAN) 150 MG tablet    We'll treat for bacterial sinus infection given the patient duration of symptoms. Patient also advised complete all antibiotics even if feeling better, use nasal spray to decrease inflammation and postnasal drip which are also exacerbating the cough, cough medicine as needed. Return to clinic if not feeling better. Coughing worse in air gets some suspicion for possible cough variant asthma which may need to be worked up if it doesn't resolve.

## 2015-02-13 ENCOUNTER — Ambulatory Visit: Payer: BLUE CROSS/BLUE SHIELD

## 2015-03-25 ENCOUNTER — Ambulatory Visit: Payer: BLUE CROSS/BLUE SHIELD

## 2015-03-25 ENCOUNTER — Ambulatory Visit (INDEPENDENT_AMBULATORY_CARE_PROVIDER_SITE_OTHER): Payer: BLUE CROSS/BLUE SHIELD | Admitting: Family Medicine

## 2015-03-25 VITALS — Temp 98.2°F

## 2015-03-25 DIAGNOSIS — Z23 Encounter for immunization: Secondary | ICD-10-CM | POA: Diagnosis not present

## 2015-04-17 ENCOUNTER — Encounter: Payer: Self-pay | Admitting: Physician Assistant

## 2015-04-17 ENCOUNTER — Ambulatory Visit (HOSPITAL_BASED_OUTPATIENT_CLINIC_OR_DEPARTMENT_OTHER)
Admission: RE | Admit: 2015-04-17 | Discharge: 2015-04-17 | Disposition: A | Discharge status: Home / Self Care | Payer: BLUE CROSS/BLUE SHIELD | Source: Ambulatory Visit | Attending: Physician Assistant | Admitting: Physician Assistant

## 2015-04-17 ENCOUNTER — Ambulatory Visit (INDEPENDENT_AMBULATORY_CARE_PROVIDER_SITE_OTHER): Payer: BLUE CROSS/BLUE SHIELD | Admitting: Physician Assistant

## 2015-04-17 ENCOUNTER — Encounter (HOSPITAL_BASED_OUTPATIENT_CLINIC_OR_DEPARTMENT_OTHER): Payer: Self-pay

## 2015-04-17 ENCOUNTER — Other Ambulatory Visit: Payer: Self-pay | Admitting: Physician Assistant

## 2015-04-17 ENCOUNTER — Encounter (HOSPITAL_BASED_OUTPATIENT_CLINIC_OR_DEPARTMENT_OTHER): Payer: Self-pay | Admitting: *Deleted

## 2015-04-17 ENCOUNTER — Ambulatory Visit (INDEPENDENT_AMBULATORY_CARE_PROVIDER_SITE_OTHER): Payer: BLUE CROSS/BLUE SHIELD

## 2015-04-17 ENCOUNTER — Other Ambulatory Visit: Payer: Self-pay

## 2015-04-17 ENCOUNTER — Inpatient Hospital Stay (HOSPITAL_BASED_OUTPATIENT_CLINIC_OR_DEPARTMENT_OTHER)
Admission: EM | Admit: 2015-04-17 | Discharge: 2015-04-18 | DRG: 176 | Disposition: A | Discharge status: Home / Self Care | Payer: BLUE CROSS/BLUE SHIELD | Attending: Internal Medicine | Admitting: Internal Medicine

## 2015-04-17 VITALS — BP 147/88 | HR 63 | Ht 62.0 in | Wt 195.0 lb

## 2015-04-17 DIAGNOSIS — N920 Excessive and frequent menstruation with regular cycle: Secondary | ICD-10-CM | POA: Diagnosis present

## 2015-04-17 DIAGNOSIS — R0600 Dyspnea, unspecified: Secondary | ICD-10-CM | POA: Diagnosis not present

## 2015-04-17 DIAGNOSIS — M546 Pain in thoracic spine: Secondary | ICD-10-CM | POA: Diagnosis not present

## 2015-04-17 DIAGNOSIS — Z86711 Personal history of pulmonary embolism: Secondary | ICD-10-CM | POA: Diagnosis present

## 2015-04-17 DIAGNOSIS — Z803 Family history of malignant neoplasm of breast: Secondary | ICD-10-CM | POA: Diagnosis not present

## 2015-04-17 DIAGNOSIS — Z8249 Family history of ischemic heart disease and other diseases of the circulatory system: Secondary | ICD-10-CM

## 2015-04-17 DIAGNOSIS — R7989 Other specified abnormal findings of blood chemistry: Secondary | ICD-10-CM

## 2015-04-17 DIAGNOSIS — N898 Other specified noninflammatory disorders of vagina: Secondary | ICD-10-CM

## 2015-04-17 DIAGNOSIS — L298 Other pruritus: Secondary | ICD-10-CM | POA: Diagnosis not present

## 2015-04-17 DIAGNOSIS — Z823 Family history of stroke: Secondary | ICD-10-CM | POA: Diagnosis not present

## 2015-04-17 DIAGNOSIS — R0602 Shortness of breath: Secondary | ICD-10-CM

## 2015-04-17 DIAGNOSIS — I2699 Other pulmonary embolism without acute cor pulmonale: Secondary | ICD-10-CM | POA: Diagnosis present

## 2015-04-17 DIAGNOSIS — M549 Dorsalgia, unspecified: Secondary | ICD-10-CM

## 2015-04-17 LAB — CBC WITH DIFFERENTIAL/PLATELET
BASOS ABS: 0 10*3/uL (ref 0.0–0.1)
Basophils Relative: 0 % (ref 0–1)
EOS ABS: 0.1 10*3/uL (ref 0.0–0.7)
EOS PCT: 3 % (ref 0–5)
HCT: 34.4 % — ABNORMAL LOW (ref 36.0–46.0)
Hemoglobin: 12.1 g/dL (ref 12.0–15.0)
Lymphocytes Relative: 38 % (ref 12–46)
Lymphs Abs: 1.7 10*3/uL (ref 0.7–4.0)
MCH: 32.7 pg (ref 26.0–34.0)
MCHC: 35.2 g/dL (ref 30.0–36.0)
MCV: 93 fL (ref 78.0–100.0)
MONO ABS: 0.3 10*3/uL (ref 0.1–1.0)
MPV: 10 fL (ref 8.6–12.4)
Monocytes Relative: 7 % (ref 3–12)
Neutro Abs: 2.3 10*3/uL (ref 1.7–7.7)
Neutrophils Relative %: 52 % (ref 43–77)
PLATELETS: 285 10*3/uL (ref 150–400)
RBC: 3.7 MIL/uL — AB (ref 3.87–5.11)
RDW: 13.7 % (ref 11.5–15.5)
WBC: 4.5 10*3/uL (ref 4.0–10.5)

## 2015-04-17 LAB — BASIC METABOLIC PANEL
ANION GAP: 8 (ref 5–15)
BUN: 12 mg/dL (ref 6–20)
CHLORIDE: 106 mmol/L (ref 101–111)
CO2: 21 mmol/L — ABNORMAL LOW (ref 22–32)
Calcium: 8.6 mg/dL — ABNORMAL LOW (ref 8.9–10.3)
Creatinine, Ser: 1.02 mg/dL — ABNORMAL HIGH (ref 0.44–1.00)
Glucose, Bld: 101 mg/dL — ABNORMAL HIGH (ref 65–99)
POTASSIUM: 3.6 mmol/L (ref 3.5–5.1)
SODIUM: 135 mmol/L (ref 135–145)

## 2015-04-17 LAB — PROTIME-INR
INR: 0.89 (ref 0.00–1.49)
PROTHROMBIN TIME: 12.3 s (ref 11.6–15.2)

## 2015-04-17 LAB — CBC
HCT: 37.9 % (ref 36.0–46.0)
HEMOGLOBIN: 12.7 g/dL (ref 12.0–15.0)
MCH: 31.8 pg (ref 26.0–34.0)
MCHC: 33.5 g/dL (ref 30.0–36.0)
MCV: 94.8 fL (ref 78.0–100.0)
PLATELETS: 301 10*3/uL (ref 150–400)
RBC: 4 MIL/uL (ref 3.87–5.11)
RDW: 12.6 % (ref 11.5–15.5)
WBC: 6.3 10*3/uL (ref 4.0–10.5)

## 2015-04-17 LAB — WET PREP, GENITAL
Clue Cells Wet Prep HPF POC: NONE SEEN
Trich, Wet Prep: NONE SEEN
Yeast Wet Prep HPF POC: NONE SEEN

## 2015-04-17 LAB — TROPONIN I: TROPONIN I: 0.03 ng/mL (ref <0.031)

## 2015-04-17 LAB — D-DIMER, QUANTITATIVE (NOT AT ARMC): D DIMER QUANT: 1.11 ug{FEU}/mL — AB (ref 0.00–0.48)

## 2015-04-17 LAB — APTT: APTT: 26 s (ref 24–37)

## 2015-04-17 MED ORDER — SODIUM CHLORIDE 0.9 % IJ SOLN
3.0000 mL | Freq: Two times a day (BID) | INTRAMUSCULAR | Status: DC
  Administered 2015-04-17: 3 mL via INTRAVENOUS

## 2015-04-17 MED ORDER — SODIUM CHLORIDE 0.9 % IJ SOLN: 3.0000 mL | Freq: Two times a day (BID) | INTRAMUSCULAR | Status: DC

## 2015-04-17 MED ORDER — SODIUM CHLORIDE 0.9 % IV SOLN: 250.0000 mL | INTRAVENOUS | Status: DC | PRN

## 2015-04-17 MED ORDER — CYCLOBENZAPRINE HCL 10 MG PO TABS: 10.0000 mg | ORAL_TABLET | Freq: Three times a day (TID) | ORAL | Status: DC | PRN

## 2015-04-17 MED ORDER — HEPARIN BOLUS VIA INFUSION
4000.0000 [IU] | Freq: Once | INTRAVENOUS | Status: AC
  Administered 2015-04-17: 4000 [IU] via INTRAVENOUS

## 2015-04-17 MED ORDER — SODIUM CHLORIDE 0.9 % IJ SOLN: 3.0000 mL | INTRAMUSCULAR | Status: DC | PRN

## 2015-04-17 MED ORDER — ONDANSETRON HCL 4 MG/2ML IJ SOLN: 4.0000 mg | Freq: Four times a day (QID) | INTRAMUSCULAR | Status: DC | PRN

## 2015-04-17 MED ORDER — OXYCODONE HCL 5 MG PO TABS: 5.0000 mg | ORAL_TABLET | ORAL | Status: DC | PRN

## 2015-04-17 MED ORDER — ACETAMINOPHEN 325 MG PO TABS: 650.0000 mg | ORAL_TABLET | Freq: Four times a day (QID) | ORAL | Status: DC | PRN

## 2015-04-17 MED ORDER — ACETAMINOPHEN 650 MG RE SUPP: 650.0000 mg | Freq: Four times a day (QID) | RECTAL | Status: DC | PRN

## 2015-04-17 MED ORDER — IOHEXOL 350 MG/ML SOLN
100.0000 mL | Freq: Once | INTRAVENOUS | Status: AC | PRN
  Administered 2015-04-17: 100 mL via INTRAVENOUS

## 2015-04-17 MED ORDER — HEPARIN (PORCINE) IN NACL 100-0.45 UNIT/ML-% IJ SOLN
1000.0000 [IU]/h | INTRAMUSCULAR | Status: DC
  Administered 2015-04-17 – 2015-04-18 (×2): 1000 [IU]/h via INTRAVENOUS
  Filled 2015-04-17 (×2): qty 250

## 2015-04-17 MED ORDER — KETOROLAC TROMETHAMINE 60 MG/2ML IM SOLN
60.0000 mg | Freq: Once | INTRAMUSCULAR | Status: AC
  Administered 2015-04-17: 60 mg via INTRAMUSCULAR

## 2015-04-17 MED ORDER — HYDROMORPHONE HCL 1 MG/ML IJ SOLN: 0.5000 mg | INTRAMUSCULAR | Status: DC | PRN

## 2015-04-17 MED ORDER — ALUM & MAG HYDROXIDE-SIMETH 200-200-20 MG/5ML PO SUSP: 30.0000 mL | Freq: Four times a day (QID) | ORAL | Status: DC | PRN

## 2015-04-17 MED ORDER — ONDANSETRON HCL 4 MG PO TABS: 4.0000 mg | ORAL_TABLET | Freq: Four times a day (QID) | ORAL | Status: DC | PRN

## 2015-04-17 MED ORDER — MELOXICAM 15 MG PO TABS: 15.0000 mg | ORAL_TABLET | Freq: Every day | ORAL | Status: DC

## 2015-04-17 NOTE — Progress Notes (Signed)
   Subjective:    Patient ID: Candace Greene, female    DOB: [birth year], 44 y.o.   MRN: [id number]  HPI  Pt is a 44 yo woman who presents to the clinic with 1 week of mid to upper back. She denies any injury or excessive activity. She takes tylenol and helps but comes back. Over the last few days she has devleoped SOB and some chest tightness. She is SOB when walking down steps or just walking out to care. Denies any wheezing, cough, fever, URI symptoms. No hx of recent travel or new meds. No recent hospital visit. She does have a heavy menstrual cycle with clots but no blood clots. Been on OCP for a while. There is some pain with deep breath. No lower leg pain or swelling.   She does have some vaginal itching and discharge. She has this off and on. She recently had abx and usually gets difulcan. She did not get this time and used monistat. She would like evaluated today.     Review of Systems  All other systems reviewed and are negative.      Objective:   Physical Exam  Constitutional: She is oriented to person, place, and time. She appears well-developed and well-nourished.  HENT:  Head: Normocephalic and atraumatic.  Right Ear: External ear normal.  Left Ear: External ear normal.  Nose: Nose normal.  Mouth/Throat: Oropharynx is clear and moist.  Eyes: Conjunctivae are normal.  Neck: Normal range of motion. Neck supple.  Cardiovascular: Normal rate, regular rhythm and normal heart sounds.   Pulmonary/Chest: Effort normal and breath sounds normal. She has no wheezes.  Musculoskeletal:  Pain to palpation over mid upper back bilateral muscles around scapula.   Lymphadenopathy:    She has no cervical adenopathy.  Neurological: She is alert and oriented to person, place, and time.  Psychiatric: She has a normal mood and affect. Her behavior is normal.          Assessment & Plan:  Mid back pain/SOB- unclear etiology. Will get D-dimer due to SOB even though pulse ox is 100  percent. Will get CBC as well with CXR to make sure no pneumonia. Could be muscloskelatal. Toradol 60mg  given IM today. If musculoskelatal given mobic daily with flexeril.   Vaginal itching and discharge- will get stat wet prep.   Addendum:elevated d-dimer. Ordered CTA confirmed PE. Sent to [place] [facility].

## 2015-04-17 NOTE — Patient Instructions (Signed)
mobic Warm compresses Muscle relaxer.   Get labs and CXR

## 2015-04-17 NOTE — H&P (Signed)
[facility] Admission History and Physical       Candace Greene [id number] DOB: June 15, 1970 DOA: 04/17/2015  Referring physician: EDP PCP: Beatrice Lecher, MD  Specialists:   Chief Complaint: SOB  HPI: Candace Greene is a 44 y.o. female with sharp Chest pain and SOB and DOE x  1 week, and saw PCP today and was sent for a CTA of Chest which revealed Acute PE.  She was referred to the [facility] ED and was placed on IV Heparin drip and referred for admission.      Review of Systems:  Constitutional: No Weight Loss, No Weight Gain, Night Sweats, Fevers, Chills, Dizziness, Light Headedness, Fatigue, or Generalized Weakness HEENT: No Headaches, Difficulty Swallowing,Tooth/Dental Problems,Sore Throat,  No Sneezing, Rhinitis, Ear Ache, Nasal Congestion, or Post Nasal Drip,  Cardio-vascular:  +Chest pain, Orthopnea, PND, Edema in Lower Extremities, Anasarca, Dizziness, Palpitations  Resp: +Dyspnea, +DOE, No Productive Cough, No Non-Productive Cough, No Hemoptysis, No Wheezing.    GI: No Heartburn, Indigestion, Abdominal Pain, Nausea, Vomiting, Diarrhea, Constipation, Hematemesis, Hematochezia, Melena, Change in Bowel Habits,  Loss of Appetite  GU: No Dysuria, No Change in Color of Urine, No Urgency or Urinary Frequency, No Flank pain.  Musculoskeletal: No Joint Pain or Swelling, No Decreased Range of Motion, No Back Pain.  Neurologic: No Syncope, No Seizures, Muscle Weakness, Paresthesia, Vision Disturbance or Loss, No Diplopia, No Vertigo, No Difficulty Walking,  Skin: No Rash or Lesions. Psych: No Change in Mood or Affect, No Depression or Anxiety, No Memory loss, No Confusion, or Hallucinations   Past Medical History  Diagnosis Date  . Heel spur     right     History reviewed. No pertinent past surgical history.    Prior to Admission medications   Medication Sig Start Date End Date Taking? Authorizing Provider  cyclobenzaprine (FLEXERIL) 10 MG tablet Take 1 tablet  (10 mg total) by mouth 3 (three) times daily as needed for muscle spasms. 04/17/15   Jade L Breeback, PA-C  diclofenac sodium (VOLTAREN) 1 % GEL Apply 2 g topically 4 (four) times daily. 11/28/14   Gregor Hams, MD  ipratropium (ATROVENT) 0.03 % nasal spray Place 2 sprays into both nostrils every 12 (twelve) hours. 02/06/15   Emeterio Reeve, DO  meloxicam (MOBIC) 15 MG tablet Take 1 tablet (15 mg total) by mouth daily. 04/17/15   Jade L Breeback, PA-C  Norethindrone-Mestranol (NECON) 1-50 MG-MCG tablet Take 1 tablet by mouth daily. 12/17/14   Hali Marry, MD     No Known Allergies  Social History:  reports that she has never smoked. She has never used smokeless tobacco. She reports that she does not drink alcohol or use illicit drugs.    Family History  Problem Relation Age of Onset  . Stroke Father     <60  . Birth defects Other   . Hyperlipidemia Other   . Heart attack Father   . Breast cancer Maternal Grandmother   . Breast cancer Maternal Aunt   . Cancer      GF       Physical Exam:  GEN:  Pleasant Well Nourished and Well DEveloped 44 y.o. African American female examined and in no acute distress; cooperative with exam Filed Vitals:   04/17/15 1930 04/17/15 2000 04/17/15 2030 04/17/15 2143  BP: 156/95 166/110 150/100 155/94  Pulse: 73 76 67 66  Temp:    98.2 F (36.8 C)  TempSrc:    Oral  Resp: 18 20 20  18  Height:    5\' 2"  (1.575 m)  Weight:    87.544 kg (193 lb)  SpO2: 98% 100% 98% 100%   Blood pressure 155/94, pulse 66, temperature 98.2 F (36.8 C), temperature source Oral, resp. rate 18, height 5\' 2"  (1.575 m), weight 87.544 kg (193 lb), last menstrual period 03/27/2015, SpO2 100 %. PSYCH: She is alert and oriented x4; does not appear anxious does not appear depressed; affect is normal HEENT: Normocephalic and Atraumatic, Mucous membranes pink; PERRLA; EOM intact; Fundi:  Benign;  No scleral icterus, Nares: Patent, Oropharynx: Clear, Fair Dentition,     Neck:  FROM, No Cervical Lymphadenopathy nor Thyromegaly or Carotid Bruit; No JVD; Breasts:: Not examined CHEST WALL: No tenderness CHEST: Normal respiration, clear to auscultation bilaterally HEART: Regular rate and rhythm; no murmurs rubs or gallops BACK: No kyphosis or scoliosis; No CVA tenderness ABDOMEN: Positive Bowel Sounds, Soft Non-Tender, No Rebound or Guarding; No Masses, No Organomegaly, No Pannus; No Intertriginous candida. Rectal Exam: Not done EXTREMITIES: No Cyanosis, Clubbing, or Edema; No Ulcerations. Genitalia: not examined PULSES: 2+ and symmetric SKIN: Normal hydration no rash or ulceration CNS:  Alert and Oriented x 4, No Focal Deficits Vascular: pulses palpable throughout    Labs on Admission:  Basic Metabolic Panel:  Recent Labs Lab 04/17/15 1805  NA 135  K 3.6  CL 106  CO2 21*  GLUCOSE 101*  BUN 12  CREATININE 1.02*  CALCIUM 8.6*   Liver Function Tests: No results for input(s): AST, ALT, ALKPHOS, BILITOT, PROT, ALBUMIN in the last 168 hours. No results for input(s): LIPASE, AMYLASE in the last 168 hours. No results for input(s): AMMONIA in the last 168 hours. CBC:  Recent Labs Lab 04/17/15 1047 04/17/15 1805  WBC 4.5 6.3  NEUTROABS 2.3  --   HGB 12.1 12.7  HCT 34.4* 37.9  MCV 93.0 94.8  PLT 285 301   Cardiac Enzymes:  Recent Labs Lab 04/17/15 1805  TROPONINI 0.03    BNP (last 3 results) No results for input(s): BNP in the last 8760 hours.  ProBNP (last 3 results) No results for input(s): PROBNP in the last 8760 hours.  CBG: No results for input(s): GLUCAP in the last 168 hours.  Radiological Exams on Admission: Dg Chest 2 View  04/17/2015  CLINICAL DATA:  Mid upper back pain with intermittent shortness of breath for the past week ; EXAM: CHEST  2 VIEW COMPARISON:  PA and lateral chest x-ray of February 06, 2015 FINDINGS: The lungs are well-expanded and clear. The heart and pulmonary vascularity are normal. The mediastinum  is normal in width. There is no pleural effusion or pneumothorax. The trachea is midline. The bony thorax is unremarkable. IMPRESSION: There is no active cardiopulmonary disease. Electronically Signed   By: David  Martinique M.D.   On: 04/17/2015 11:09   Ct Angio Chest W/cm &/or Wo Cm  04/17/2015  ADDENDUM REPORT: 04/17/2015 17:38 ADDENDUM: These results were called by telephone at the time of interpretation on 04/17/2015 at 5:33 pm to Dr. Iran Planas , who verbally acknowledged these results. Electronically Signed   By: Abelardo Diesel M.D.   On: 04/17/2015 17:38  04/17/2015  CLINICAL DATA:  Shortness of breath for several days, elevated D-dimer today. EXAM: CT ANGIOGRAPHY CHEST WITH CONTRAST TECHNIQUE: Multidetector CT imaging of the chest was performed using the standard protocol during bolus administration of intravenous contrast. Multiplanar CT image reconstructions and MIPs were obtained to evaluate the vascular anatomy. CONTRAST:  184mL OMNIPAQUE IOHEXOL 350 MG/ML  SOLN COMPARISON:  Chest x-ray April 17, 2015 FINDINGS: There is pulmonary artery embolus involving the right main pulmonary artery and the bilateral segmental pulmonary arteries of the upper lobes, lower lobes and right middle lobe. The right ventricular to left ventricular ratio is 0.95, within normal limits. There is wedge-like opacity in the medial anterior left upper lobe, developing pulmonary infarct is not excluded. The lungs are otherwise clear. There is no pleural effusion. The aorta is normal in caliber without evidence of dissection. The heart size is normal. There is no pericardial effusion. The visualized upper abdominal structures are normal. The bones are normal. Review of the MIP images confirms the above findings. IMPRESSION: Acute pulmonary embolus. Which like opacity in the medial anterior left upper lobe, developing pulmonary infarct is not excluded. Electronically Signed: By: Abelardo Diesel M.D. On: 04/17/2015 17:12      EKG: Independently reviewed.    Assessment/Plan:   44 y.o. female with  Active Problems:   1.      Acute pulmonary embolism ([place])- with chest Pain and SOB   IV Heparin Drip   NCO2 PRN   Discontinue OCPs        2.      DVT Prophylaxis       On IV Heparin      Code Status:     FULL CODE        Family Communication:   No Family Present    Disposition Plan:    Inpatient Status        Time spent:  [facility] Pager [phone]   If [place] Please Contact the Day Rounding Team MD for [facility]  If 7PM-7AM, Please Contact Night-Floor Coverage  [email or web address] Password [facility] 04/17/2015, 11:28 PM     ADDENDUM:   Patient was seen and examined on 04/17/2015

## 2015-04-17 NOTE — ED Provider Notes (Signed)
CSN: [id number]     Arrival date & time 04/17/15  1738 History   First MD Initiated Contact with Patient 04/17/15 1806     Chief Complaint  Patient presents with  . Shortness of Breath     (Consider location/radiation/quality/duration/timing/severity/associated sxs/prior Treatment) HPI  Pt presents with c/o shortness of breath and chest pain.  She states she has been feeling more short of breath for the past week- feels winded when walking short distances.  Also c/o sharp chest pain that will come and go.  No leg swelling.  No fainting.  She was seen by her PMD and CT angio chest was ordered which shows acute PE- she was referred to come to the ED. She has no hx of DVT/PE, she has had no recent trauma/travel/surgery, she states she does take OCPs.  She has not had any treatment prior to arrival.  There are no other associated systemic symptoms, there are no other alleviating or modifying factors.   Past Medical History  Diagnosis Date  . Heel spur     right   History reviewed. No pertinent past surgical history. Family History  Problem Relation Age of Onset  . Stroke Father     <60  . Birth defects Other   . Hyperlipidemia Other   . Heart attack Father   . Breast cancer Maternal Grandmother   . Breast cancer Maternal Aunt   . Cancer      GF   Social History  Substance Use Topics  . Smoking status: Never Smoker   . Smokeless tobacco: Never Used  . Alcohol Use: No   OB History    No data available     Review of Systems  ROS reviewed and all otherwise negative except for mentioned in HPI    Allergies  Review of patient's allergies indicates no known allergies.  Home Medications   Prior to Admission medications   Medication Sig Start Date End Date Taking? Authorizing Provider  cyclobenzaprine (FLEXERIL) 10 MG tablet Take 1 tablet (10 mg total) by mouth 3 (three) times daily as needed for muscle spasms. 04/17/15   Jade L Breeback, PA-C  diclofenac sodium (VOLTAREN) 1  % GEL Apply 2 g topically 4 (four) times daily. 11/28/14   Gregor Hams, MD  ipratropium (ATROVENT) 0.03 % nasal spray Place 2 sprays into both nostrils every 12 (twelve) hours. 02/06/15   Emeterio Reeve, DO  Rivaroxaban (XARELTO STARTER PACK) 15 & 20 MG TBPK Take as directed on package: Start with one 15mg  tablet by mouth twice a day with food. On Day 22, switch to one 20mg  tablet once a day with food. 04/18/15   Maryann Mikhail, DO   BP 122/63 mmHg  Pulse 62  Temp(Src) 98 F (36.7 C) (Oral)  Resp 18  Ht 5\' 2"  (1.575 m)  Wt 193 lb (87.544 kg)  BMI 35.29 kg/m2  SpO2 100%  LMP 03/27/2015  Vitals reviewed Physical Exam  Physical Examination: General appearance - alert, well appearing, and in no distress Mental status - alert, oriented to person, place, and time Eyes - no scleral icterus, no conjunctival injection Mouth - mucous membranes moist, pharynx normal without lesions Chest - clear to auscultation, no wheezes, rales or rhonchi, symmetric air entry, normal respiratory effort Heart - normal rate, regular rhythm, normal S1, S2, no murmurs, rubs, clicks or gallops Abdomen - soft, nontender, nondistended, no masses or organomegaly Neurological - alert, oriented, normal speech Extremities - peripheral pulses normal, no pedal edema, no  clubbing or cyanosis Skin - normal coloration and turgor, no rashes  ED Course  Procedures (including critical care time)  CRITICAL CARE Performed by: Threasa Beards Total critical care time: 35 minutes Critical care time was exclusive of separately billable procedures and treating other patients. Critical care was necessary to treat or prevent imminent or life-threatening deterioration. Critical care was time spent personally by me on the following activities: development of treatment plan with patient and/or surrogate as well as nursing, discussions with consultants, evaluation of patient's response to treatment, examination of patient, obtaining  history from patient or surrogate, ordering and performing treatments and interventions, ordering and review of laboratory studies, ordering and review of radiographic studies, pulse oximetry and re-evaluation of patient's condition. Labs Review Labs Reviewed  BASIC METABOLIC PANEL - Abnormal; Notable for the following:    CO2 21 (*)    Glucose, Bld 101 (*)    Creatinine, Ser 1.02 (*)    Calcium 8.6 (*)    All other components within normal limits  BASIC METABOLIC PANEL - Abnormal; Notable for the following:    CO2 13 (*)    Glucose, Bld 127 (*)    Calcium 8.7 (*)    All other components within normal limits  CBC  TROPONIN I  PROTIME-INR  APTT  CBC  HEPARIN LEVEL (UNFRACTIONATED)  HEPARIN LEVEL (UNFRACTIONATED)  CBC    Imaging Review Dg Chest 2 View  04/17/2015  CLINICAL DATA:  Mid upper back pain with intermittent shortness of breath for the past week ; EXAM: CHEST  2 VIEW COMPARISON:  PA and lateral chest x-ray of February 06, 2015 FINDINGS: The lungs are well-expanded and clear. The heart and pulmonary vascularity are normal. The mediastinum is normal in width. There is no pleural effusion or pneumothorax. The trachea is midline. The bony thorax is unremarkable. IMPRESSION: There is no active cardiopulmonary disease. Electronically Signed   By: David  Martinique M.D.   On: 04/17/2015 11:09   Ct Angio Chest W/cm &/or Wo Cm  04/17/2015  ADDENDUM REPORT: 04/17/2015 17:38 ADDENDUM: These results were called by telephone at the time of interpretation on 04/17/2015 at 5:33 pm to Dr. Iran Planas , who verbally acknowledged these results. Electronically Signed   By: Abelardo Diesel M.D.   On: 04/17/2015 17:38  04/17/2015  CLINICAL DATA:  Shortness of breath for several days, elevated D-dimer today. EXAM: CT ANGIOGRAPHY CHEST WITH CONTRAST TECHNIQUE: Multidetector CT imaging of the chest was performed using the standard protocol during bolus administration of intravenous contrast. Multiplanar  CT image reconstructions and MIPs were obtained to evaluate the vascular anatomy. CONTRAST:  147mL OMNIPAQUE IOHEXOL 350 MG/ML SOLN COMPARISON:  Chest x-ray April 17, 2015 FINDINGS: There is pulmonary artery embolus involving the right main pulmonary artery and the bilateral segmental pulmonary arteries of the upper lobes, lower lobes and right middle lobe. The right ventricular to left ventricular ratio is 0.95, within normal limits. There is wedge-like opacity in the medial anterior left upper lobe, developing pulmonary infarct is not excluded. The lungs are otherwise clear. There is no pleural effusion. The aorta is normal in caliber without evidence of dissection. The heart size is normal. There is no pericardial effusion. The visualized upper abdominal structures are normal. The bones are normal. Review of the MIP images confirms the above findings. IMPRESSION: Acute pulmonary embolus. Which like opacity in the medial anterior left upper lobe, developing pulmonary infarct is not excluded. Electronically Signed: By: Abelardo Diesel M.D. On: 04/17/2015 17:12  I have personally reviewed and evaluated these images and lab results as part of my medical decision-making.   EKG Interpretation   Date/Time:  Friday April 17 2015 19:13:49 EST Ventricular Rate:  65 PR Interval:  124 QRS Duration: 80 QT Interval:  404 QTC Calculation: 420 R Axis:   73 Text Interpretation:  Normal sinus rhythm with sinus arrhythmia Normal ECG  ED PHYSICIAN INTERPRETATION AVAILABLE IN [facility] Confirmed by  TEST, Record ([id number]) on 04/18/2015 9:19:04 AM      Date: 04/17/2015  Rate: 65  Rhythm: normal sinus rhythm with sinus arrythmia  QRS Axis: normal  Intervals: normal  ST/T Wave abnormalities: normal  Conduction Disutrbances: none  Narrative Interpretation: unremarkable    MDM   Final diagnoses:  PE (pulmonary embolism)    Pt presenting with c/o chest pain and shortness of breath.  She had CT  angio earlier today which showed acute PE.  Pt started on IV heparin drip.  D/w [facility] for admission.  Dr. Arnoldo Morale accepting.  Pt is hemodynamically stable.  She is agreeable with plan for admit.      Alfonzo Beers, MD 04/18/15 2115

## 2015-04-17 NOTE — ED Notes (Signed)
Pt c/o SOB and CP x 1 week.  CT scan Positive PE

## 2015-04-17 NOTE — Progress Notes (Signed)
Coming from [facility] ED to Tele Bed at [facility] (DOB: [birth year]) 71 yoF with Chest pain and SOB x few days saw PCP who sent for an outpt  CTA of Chest which revealed Acute PE, placed on IV Heparin drip, reported as Hemodynamically stable.onlyRisk factor OCPs. EDP: [place] [id number]

## 2015-04-17 NOTE — ED Notes (Signed)
2 unsuccessful IV starts.

## 2015-04-18 ENCOUNTER — Inpatient Hospital Stay (HOSPITAL_COMMUNITY): Payer: BLUE CROSS/BLUE SHIELD

## 2015-04-18 DIAGNOSIS — R0600 Dyspnea, unspecified: Secondary | ICD-10-CM

## 2015-04-18 DIAGNOSIS — I2699 Other pulmonary embolism without acute cor pulmonale: Secondary | ICD-10-CM

## 2015-04-18 DIAGNOSIS — N920 Excessive and frequent menstruation with regular cycle: Secondary | ICD-10-CM

## 2015-04-18 LAB — BASIC METABOLIC PANEL
ANION GAP: 14 (ref 5–15)
BUN: 9 mg/dL (ref 6–20)
CALCIUM: 8.7 mg/dL — AB (ref 8.9–10.3)
CO2: 13 mmol/L — AB (ref 22–32)
Chloride: 111 mmol/L (ref 101–111)
Creatinine, Ser: 0.99 mg/dL (ref 0.44–1.00)
GFR calc non Af Amer: >60 mL/min (ref >60)
Glucose, Bld: 127 mg/dL — ABNORMAL HIGH (ref 65–99)
Potassium: 3.5 mmol/L (ref 3.5–5.1)
Sodium: 138 mmol/L (ref 135–145)

## 2015-04-18 LAB — CBC
HCT: 37.7 % (ref 36.0–46.0)
HEMOGLOBIN: 12.5 g/dL (ref 12.0–15.0)
MCH: 32.1 pg (ref 26.0–34.0)
MCHC: 33.2 g/dL (ref 30.0–36.0)
MCV: 96.9 fL (ref 78.0–100.0)
PLATELETS: 287 10*3/uL (ref 150–400)
RBC: 3.89 MIL/uL (ref 3.87–5.11)
RDW: 13.3 % (ref 11.5–15.5)
WBC: 5.7 10*3/uL (ref 4.0–10.5)

## 2015-04-18 LAB — HEPARIN LEVEL (UNFRACTIONATED)
HEPARIN UNFRACTIONATED: 0.47 [IU]/mL (ref 0.30–0.70)
Heparin Unfractionated: 0.53 IU/mL (ref 0.30–0.70)

## 2015-04-18 MED ORDER — APIXABAN 5 MG PO TABS: ORAL_TABLET | ORAL | Status: DC

## 2015-04-18 MED ORDER — RIVAROXABAN (XARELTO) VTE STARTER PACK (15 & 20 MG): ORAL_TABLET | ORAL | Status: DC

## 2015-04-18 MED ORDER — RIVAROXABAN 15 MG PO TABS
15.0000 mg | ORAL_TABLET | Freq: Two times a day (BID) | ORAL | Status: DC
  Administered 2015-04-18: 15 mg via ORAL
  Filled 2015-04-18: qty 1

## 2015-04-18 MED ORDER — NITROGLYCERIN 2 % TD OINT: 1.0000 [in_us] | TOPICAL_OINTMENT | Freq: Four times a day (QID) | TRANSDERMAL | Status: DC

## 2015-04-18 NOTE — Progress Notes (Signed)
Echocardiogram 2D Echocardiogram has been performed.  Candace Greene 04/18/2015, 1:29 PM

## 2015-04-18 NOTE — Progress Notes (Signed)
Pt arrived from [facility] on stretcher.  Arrived with a heparin drip.  Pt is alert and oriented and independent.  Currently resting in bed with call bell in reach and heart monitor in place.  No current pain.  Will continue to monitor pt. Lupita Dawn, RN

## 2015-04-18 NOTE — Discharge Summary (Signed)
Physician Discharge Summary  Candace Greene [id number] DOB: 03/27/1971 DOA: 04/17/2015  PCP: Candace Lecher, Greene  Admit date: 04/17/2015 Discharge date: 04/18/2015  Time spent: 40 minutes  Recommendations for Outpatient Follow-up:  Patient will be discharged to home.  Patient will need to follow up with primary care provider within one week of discharge, follow up CBC.  Follow up with Ob/Gyn.  Patient should continue medications as prescribed.  Patient should follow a regular diet.   Discharge Diagnoses:  Acute dyspnea secondary to acute pulmonary embolism Menorrhagia  Discharge Condition: Stable  Diet recommendation: Regular  Filed Weights   04/17/15 1743 04/17/15 2143  Weight: 88.451 kg (195 lb) 87.544 kg (193 lb)    History of present illness:  on 04/17/2015 by Dr. Jana Greene Candace Greene is a 44 y.o. female with sharp Chest pain and SOB and DOE x 1 week, and saw PCP today and was sent for a CTA of Chest which revealed Acute PE. She was referred to the [facility] ED and was placed on IV Heparin drip and referred for admission.   Hospital Course:  Acute dyspnea secondary to acute pulmonary embolism -CTA of the chest was positive for pulmonary embolism -Patient was from IV heparin drip -Follow-up to be secondary to oral contraceptives. Has spoken to patient regarding discontinuing this medication in speaking with her OB/GYN -Echocardiogram: EF 0000000, grade 1 diastolic dysfunction, trace MR and TR -Transitioned patient to oral anticoagulant agent- Xarelto -Patient counseled on the risk vs benefits of AC. -LE Doppler: Negative for DVT and SVT  Menorrhagia -As the patient regarding discontinuing her OCPs as this could be a risk factor for her pulmonary embolisms -She also states that she travels to work 2 hours per day (one hour per time), over while at work she is usually on her feet -Patient states her last period was heavy with blood  clots.  Procedures: Echocardiogram  Consultations: None  Discharge Exam: Filed Vitals:   04/17/15 2143 04/18/15 0524  BP: 155/94 122/63  Pulse: 66 62  Temp: 98.2 F (36.8 C) 98 F (36.7 C)  Resp: 18 18   Exam  General: Well developed, well nourished, NAD, appears stated age  HEENT: NCAT, mucous membranes moist.   Cardiovascular: S1 S2 auscultated, RRR  Respiratory: Clear to auscultation bilaterally   Abdomen: Soft, nontender, nondistended, + bowel sounds  Extremities: warm dry without cyanosis clubbing or edema  Neuro: AAOx3, nonfocal  Psych: Normal affect and demeanor  Discharge Instructions      Discharge Instructions    Discharge instructions    Complete by:  As directed   Patient will be discharged to home.  Patient will need to follow up with primary care provider within one week of discharge, follow up CBC.  Follow up with Ob/Gyn.  Patient should continue medications as prescribed.  Patient should follow a regular diet.            Medication List    STOP taking these medications        meloxicam 15 MG tablet  Commonly known as:  MOBIC     Norethindrone-Mestranol 1-50 MG-MCG tablet  Commonly known as:  NECON      TAKE these medications        cyclobenzaprine 10 MG tablet  Commonly known as:  FLEXERIL  Take 1 tablet (10 mg total) by mouth 3 (three) times daily as needed for muscle spasms.     diclofenac sodium 1 % Gel  Commonly known as:  VOLTAREN  Apply 2 g topically 4 (four) times daily.     ipratropium 0.03 % nasal spray  Commonly known as:  ATROVENT  Place 2 sprays into both nostrils every 12 (twelve) hours.     Rivaroxaban 15 & 20 MG Tbpk  Commonly known as:  XARELTO STARTER PACK  Take as directed on package: Start with one 15mg  tablet by mouth twice a day with food. On Day 22, switch to one 20mg  tablet once a day with food.       No Known Allergies Follow-up Information    Follow up with Candace Greene. Schedule an  appointment as soon as possible for a visit in 1 week.   Specialty:  Family Medicine   Why:  Hospital follow up   Contact information:   [id number] [place] [address] [address] [phone]        The results of significant diagnostics from this hospitalization (including imaging, microbiology, ancillary and laboratory) are listed below for reference.    Significant Diagnostic Studies: Dg Chest 2 View  04/17/2015  CLINICAL DATA:  Mid upper back pain with intermittent shortness of breath for the past week ; EXAM: CHEST  2 VIEW COMPARISON:  PA and lateral chest x-ray of February 06, 2015 FINDINGS: The lungs are well-expanded and clear. The heart and pulmonary vascularity are normal. The mediastinum is normal in width. There is no pleural effusion or pneumothorax. The trachea is midline. The bony thorax is unremarkable. IMPRESSION: There is no active cardiopulmonary disease. Electronically Signed   By: Candace  Greene M.D.   On: 04/17/2015 11:09   Ct Angio Chest W/cm &/or Wo Cm  04/17/2015  ADDENDUM REPORT: 04/17/2015 17:38 ADDENDUM: These results were called by telephone at the time of interpretation on 04/17/2015 at 5:33 pm to Candace Greene , who verbally acknowledged these results. Electronically Signed   By: Candace Greene M.D.   On: 04/17/2015 17:38  04/17/2015  CLINICAL DATA:  Shortness of breath for several days, elevated D-dimer today. EXAM: CT ANGIOGRAPHY CHEST WITH CONTRAST TECHNIQUE: Multidetector CT imaging of the chest was performed using the standard protocol during bolus administration of intravenous contrast. Multiplanar CT image reconstructions and MIPs were obtained to evaluate the vascular anatomy. CONTRAST:  112mL OMNIPAQUE IOHEXOL 350 MG/ML SOLN COMPARISON:  Chest x-ray April 17, 2015 FINDINGS: There is pulmonary artery embolus involving the right main pulmonary artery and the bilateral segmental pulmonary arteries of the upper lobes, lower lobes and right  middle lobe. The right ventricular to left ventricular ratio is 0.95, within normal limits. There is wedge-like opacity in the medial anterior left upper lobe, developing pulmonary infarct is not excluded. The lungs are otherwise clear. There is no pleural effusion. The aorta is normal in caliber without evidence of dissection. The heart size is normal. There is no pericardial effusion. The visualized upper abdominal structures are normal. The bones are normal. Review of the MIP images confirms the above findings. IMPRESSION: Acute pulmonary embolus. Which like opacity in the medial anterior left upper lobe, developing pulmonary infarct is not excluded. Electronically Signed: By: Candace Greene M.D. On: 04/17/2015 17:12    Microbiology: Recent Results (from the past 240 hour(s))  Wet prep, genital     Status: None   Collection Time: 04/17/15 11:08 AM  Result Value Ref Range Status   Yeast Wet Prep HPF POC NONE SEEN NONE SEEN Final   Trich, Wet Prep NONE SEEN NONE SEEN Final   Clue Cells Wet Prep  HPF POC NONE SEEN NONE SEEN Final   WBC, Wet Prep HPF POC FEW NONE SEEN Final     Labs: Basic Metabolic Panel:  Recent Labs Lab 04/17/15 1805 04/18/15 0113  NA 135 138  K 3.6 3.5  CL 106 111  CO2 21* 13*  GLUCOSE 101* 127*  BUN 12 9  CREATININE 1.02* 0.99  CALCIUM 8.6* 8.7*   Liver Function Tests: No results for input(s): AST, ALT, ALKPHOS, BILITOT, PROT, ALBUMIN in the last 168 hours. No results for input(s): LIPASE, AMYLASE in the last 168 hours. No results for input(s): AMMONIA in the last 168 hours. CBC:  Recent Labs Lab 04/17/15 1047 04/17/15 1805 04/18/15 0113  WBC 4.5 6.3 5.7  NEUTROABS 2.3  --   --   HGB 12.1 12.7 12.5  HCT 34.4* 37.9 37.7  MCV 93.0 94.8 96.9  PLT 285 301 287   Cardiac Enzymes:  Recent Labs Lab 04/17/15 1805  TROPONINI 0.03   BNP: BNP (last 3 results) No results for input(s): BNP in the last 8760 hours.  ProBNP (last 3 results) No results for  input(s): PROBNP in the last 8760 hours.  CBG: No results for input(s): GLUCAP in the last 168 hours.     SignedCristal Ford  [facility] 04/18/2015, 5:20 PM

## 2015-04-18 NOTE — Progress Notes (Signed)
ANTICOAGULATION CONSULT NOTE - Follow Up Consult  Pharmacy Consult for Heparin  Indication: pulmonary embolus  No Known Allergies  Patient Measurements: Height: 5\' 2"  (157.5 cm) Weight: 193 lb (87.544 kg) IBW/kg (Calculated) : 50.1  Vital Signs: Temp: 98.2 F (36.8 C) (12/23 2143) Temp Source: Oral (12/23 2143) BP: 155/94 mmHg (12/23 2143) Pulse Rate: 66 (12/23 2143)  Labs:  Recent Labs  04/17/15 1047 04/17/15 1805 04/18/15 0113  HGB 12.1 12.7 12.5  HCT 34.4* 37.9 37.7  PLT 285 301 287  APTT  --  26  --   LABPROT  --  12.3  --   INR  --  0.89  --   HEPARINUNFRC  --   --  0.47  CREATININE  --  1.02* 0.99  TROPONINI  --  0.03  --     Estimated Creatinine Clearance: 74.5 mL/min (by C-G formula based on Cr of 0.99).   Assessment: 44 y/o F tx from [facility] with heparin infusing at 1000 units/hr for new onset PE per outpatient CTA. Baseline CBC/renal function good. Baseline INR is WNL. Initial heparin level is good at 0.47.   Goal of Therapy:  Heparin level 0.3-0.7 units/ml Monitor platelets by anticoagulation protocol: Yes   Plan:  -Continue heparin at 1000 units/hr -0800 HL to confirm -Daily CBC/HL -Monitor for bleeding -F/U plans for oral anti-coagulation   Narda Bonds 04/18/2015,2:14 AM

## 2015-04-18 NOTE — Discharge Instructions (Signed)

## 2015-04-18 NOTE — Progress Notes (Signed)
VASCULAR LAB PRELIMINARY  PRELIMINARY  PRELIMINARY  PRELIMINARY  Bilateral lower extremity venous duplex completed.    Preliminary report:  There is no DVT or SVT noted in the bilateral lower extremities.   Shakiya Mcneary, RVT 04/18/2015, 5:10 PM

## 2015-04-18 NOTE — Progress Notes (Signed)
ANTICOAGULATION CONSULT NOTE - Follow Up Consult  Pharmacy Consult for Heparin  Indication: pulmonary embolus  No Known Allergies  Patient Measurements: Height: 5\' 2"  (157.5 cm) Weight: 193 lb (87.544 kg) IBW/kg (Calculated) : 50.1  Heparin dosing weight: 70 kg  Vital Signs: Temp: 98 F (36.7 C) (12/24 0524) Temp Source: Oral (12/24 0524) BP: 122/63 mmHg (12/24 0524) Pulse Rate: 62 (12/24 0524)  Labs:  Recent Labs  04/17/15 1047 04/17/15 1805 04/18/15 0113 04/18/15 0927  HGB 12.1 12.7 12.5  --   HCT 34.4* 37.9 37.7  --   PLT 285 301 287  --   APTT  --  26  --   --   LABPROT  --  12.3  --   --   INR  --  0.89  --   --   HEPARINUNFRC  --   --  0.47 0.53  CREATININE  --  1.02* 0.99  --   TROPONINI  --  0.03  --   --     Estimated Creatinine Clearance: 74.5 mL/min (by C-G formula based on Cr of 0.99).   Assessment: 44 year old female on IV heparin for new onset PE.   Confirmatory heparin level remains therapeutic.   Goal of Therapy:  Heparin level 0.3-0.7 units/ml Monitor platelets by anticoagulation protocol: Yes   Plan:  -Continue heparin at 1000 units/hr -Daily CBC/HL -Monitor for bleeding -F/U plans for oral anti-coagulation   Sloan Leiter, PharmD, BCPS Clinical Pharmacist [phone]  04/18/2015,10:45 AM

## 2015-04-18 NOTE — Progress Notes (Addendum)
CM spoke with pt regarding potential anticoagulant cost. Unable to due benefits check  2/2 insurance office closed. Pt stated uses [facility] in [place], prescriptions obtained from MD Arne [facility] and [place]) and faxed to pharmacy @ [phone] to determine cost. CM to f/u with MD and pt once results are determine.  04/18/2015 @1427  CM spoke with pharmacy assistant @ [facility] regarding Eliquis ($ 50.71) ,Xarelto ($ 60.00), copay cost.  Information shared with MD and pharmacist. MD decided to prescribe Xarelto for pt. Pharmacist to talk with pt and give 30 day trial card and zero copay card with explanation. Whitman Hero RN, [place] [phone]

## 2015-04-18 NOTE — Progress Notes (Signed)
Patient requesting work note. Paged remote floor coverage. Patient instructed to call the "if you have any questions" number on her discharge packet on Monday and arrangements can be made at that time. Discharge completed, patient in no distress or pain.

## 2015-04-18 NOTE — Progress Notes (Signed)
ANTICOAGULATION CONSULT NOTE - Initial Consult  Pharmacy Consult for Switch Heparin to Xarelto Indication: pulmonary embolus  No Known Allergies  Patient Measurements: Height: 5\' 2"  (157.5 cm) Weight: 193 lb (87.544 kg) IBW/kg (Calculated) : 50.1  Vital Signs: Temp: 98 F (36.7 C) (12/24 0524) Temp Source: Oral (12/24 0524) BP: 122/63 mmHg (12/24 0524) Pulse Rate: 62 (12/24 0524)  Labs:  Recent Labs  04/17/15 1047 04/17/15 1805 04/18/15 0113 04/18/15 0927  HGB 12.1 12.7 12.5  --   HCT 34.4* 37.9 37.7  --   PLT 285 301 287  --   APTT  --  26  --   --   LABPROT  --  12.3  --   --   INR  --  0.89  --   --   HEPARINUNFRC  --   --  0.47 0.53  CREATININE  --  1.02* 0.99  --   TROPONINI  --  0.03  --   --     Estimated Creatinine Clearance: 74.5 mL/min (by C-G formula based on Cr of 0.99).   Medical History: Past Medical History  Diagnosis Date  . Heel spur     right   Assessment: 44 year old female on IV heparin for PE now to switch to Xarelto.  Discussed therapy with patient and she reported understanding.  SCr 0.99, CrCl ~70-75 mL/min.  CBC wnl. No overt bleeding noted.   Goal of Therapy:  Monitor platelets by anticoagulation protocol: Yes   Plan:  Xarelto 15mg  po BID x21 days, then change to Xarelto 20mg  po daily with supper.  Monitor renal function, CBC, and for signs and symptoms.   Sloan Leiter, PharmD, BCPS Clinical Pharmacist [phone] 04/18/2015,3:49 PM

## 2015-04-18 NOTE — Progress Notes (Signed)
[facility]                                                                              Patient Demographics  Candace Greene, is a 44 y.o. female, DOB - Aug 07, 1970, [id number]  Admit date - 04/17/2015   Admitting Physician Ron Parker, MD  Outpatient Primary MD for the patient is METHENEY,CATHERINE, MD  LOS - 1   Chief Complaint  Patient presents with  . Shortness of Breath      HPI on 04/17/2015 by Dr. Della Goo Mertie Krohn is a 44 y.o. female with sharp Chest pain and SOB and DOE x 1 week, and saw PCP today and was sent for a CTA of Chest which revealed Acute PE. She was referred to the [facility] ED and was placed on IV Heparin drip and referred for admission.   Assessment & Plan   Acute dyspnea secondary to acute pulmonary embolism -CTA of the chest was positive for pulmonary embolism -Patient was from IV heparin drip -Follow-up to be secondary to oral contraceptives.  Has spoken to patient regarding discontinuing this medication in speaking with her OB/GYN -Will obtain lower extremity Dopplers as well as echocardiogram -Will transition patient to oral anticoagulant agent  Menorrhagia -As the patient regarding discontinuing her OCPs as this could be a risk factor for her pulmonary embolisms -She also states that she travels to work 2 hours per day (one hour per time), over while at work she is usually on her feet -Patient states her last period was heavy with blood clots.  Code Status: Full  Family Communication: None at bedside  Disposition Plan: Admitted. Pending echocardiogram lower extremity Doppler  Time Spent in minutes   30 minutes  Procedures  None  Consults   None  DVT Prophylaxis  Heparin  Lab Results  Component Value Date   PLT 287 04/18/2015    Medications  Scheduled Meds: . sodium chloride  3 mL Intravenous Q12H  . sodium chloride  3 mL Intravenous Q12H   Continuous Infusions: . heparin 1,000 Units/hr (04/17/15  1837)   PRN Meds:.sodium chloride, acetaminophen **OR** acetaminophen, alum & mag hydroxide-simeth, HYDROmorphone (DILAUDID) injection, ondansetron **OR** ondansetron (ZOFRAN) IV, oxyCODONE, sodium chloride  Antibiotics    Anti-infectives    None      Subjective:   Candace Greene seen and examined today.  Patient states her breathing has improved. She denies any chest pain at this time. Denies any headache, dizziness, nausea or vomiting, abdominal pain, change in bowel pattern.  Objective:   Filed Vitals:   04/17/15 2000 04/17/15 2030 04/17/15 2143 04/18/15 0524  BP: 166/110 150/100 155/94 122/63  Pulse: 76 67 66 62  Temp:   98.2 F (36.8 C) 98 F (36.7 C)  TempSrc:   Oral Oral  Resp: 20 20 18 18   Height:   5\' 2"  (1.575 m)   Weight:   87.544 kg (193 lb)   SpO2: 100% 98% 100% 100%    Wt Readings from Last 3 Encounters:  04/17/15 87.544 kg (193 lb)  04/17/15 88.451 kg (195 lb)  02/06/15 91.173 kg (201 lb)    No intake or output data in the 24 hours  ending 04/18/15 1154  Exam  General: Well developed, well nourished, NAD, appears stated age  HEENT: NCAT, mucous membranes moist.   Cardiovascular: S1 S2 auscultated, no rubs, murmurs or gallops. Regular rate and rhythm.  Respiratory: Clear to auscultation bilaterally with equal chest rise  Abdomen: Soft, nontender, nondistended, + bowel sounds  Extremities: warm dry without cyanosis clubbing or edema  Neuro: AAOx3, nonfocal  Psych: Normal affect and demeanor     Data Review   Micro Results Recent Results (from the past 240 hour(s))  Wet prep, genital     Status: None   Collection Time: 04/17/15 11:08 AM  Result Value Ref Range Status   Yeast Wet Prep HPF POC NONE SEEN NONE SEEN Final   Trich, Wet Prep NONE SEEN NONE SEEN Final   Clue Cells Wet Prep HPF POC NONE SEEN NONE SEEN Final   WBC, Wet Prep HPF POC FEW NONE SEEN Final    Radiology Reports Dg Chest 2 View  04/17/2015  CLINICAL DATA:  Mid upper  back pain with intermittent shortness of breath for the past week ; EXAM: CHEST  2 VIEW COMPARISON:  PA and lateral chest x-ray of February 06, 2015 FINDINGS: The lungs are well-expanded and clear. The heart and pulmonary vascularity are normal. The mediastinum is normal in width. There is no pleural effusion or pneumothorax. The trachea is midline. The bony thorax is unremarkable. IMPRESSION: There is no active cardiopulmonary disease. Electronically Signed   By: David  [place] M.D.   On: 04/17/2015 11:09   Ct Angio Chest W/cm &/or Wo Cm  04/17/2015  ADDENDUM REPORT: 04/17/2015 17:38 ADDENDUM: These results were called by telephone at the time of interpretation on 04/17/2015 at 5:33 pm to Dr. Tandy Gaw , who verbally acknowledged these results. Electronically Signed   By: Sherian Rein M.D.   On: 04/17/2015 17:38  04/17/2015  CLINICAL DATA:  Shortness of breath for several days, elevated D-dimer today. EXAM: CT ANGIOGRAPHY CHEST WITH CONTRAST TECHNIQUE: Multidetector CT imaging of the chest was performed using the standard protocol during bolus administration of intravenous contrast. Multiplanar CT image reconstructions and MIPs were obtained to evaluate the vascular anatomy. CONTRAST:  OMNIPAQUE IOHEXOL 350 MG/ML SOLN COMPARISON:  Chest x-ray April 17, 2015 FINDINGS: There is pulmonary artery embolus involving the right main pulmonary artery and the bilateral segmental pulmonary arteries of the upper lobes, lower lobes and right middle lobe. The right ventricular to left ventricular ratio is 0.95, within normal limits. There is wedge-like opacity in the medial anterior left upper lobe, developing pulmonary infarct is not excluded. The lungs are otherwise clear. There is no pleural effusion. The aorta is normal in caliber without evidence of dissection. The heart size is normal. There is no pericardial effusion. The visualized upper abdominal structures are normal. The bones are normal. Review of  the MIP images confirms the above findings. IMPRESSION: Acute pulmonary embolus. Which like opacity in the medial anterior left upper lobe, developing pulmonary infarct is not excluded. Electronically Signed: By: Sherian Rein M.D. On: 04/17/2015 17:12    CBC  Recent Labs Lab 04/17/15 1047 04/17/15 1805 04/18/15 0113  WBC 4.5 6.3 5.7  HGB 12.1 12.7 12.5  HCT 34.4* 37.9 37.7  PLT 285 301 287  MCV 93.0 94.8 96.9  MCH 32.7 31.8 32.1  MCHC 35.2 33.5 33.2  RDW 13.7 12.6 13.3  LYMPHSABS 1.7  --   --   MONOABS 0.3  --   --   EOSABS 0.1  --   --  BASOSABS 0.0  --   --     Chemistries   Recent Labs Lab 04/17/15 1805 04/18/15 0113  NA 135 138  K 3.6 3.5  CL 106 111  CO2 21* 13*  GLUCOSE 101* 127*  BUN 12 9  CREATININE 1.02* 0.99  CALCIUM 8.6* 8.7*   ------------------------------------------------------------------------------------------------------------------ estimated creatinine clearance is 74.5 mL/min (by C-G formula based on Cr of 0.99). ------------------------------------------------------------------------------------------------------------------ No results for input(s): HGBA1C in the last 72 hours. ------------------------------------------------------------------------------------------------------------------ No results for input(s): CHOL, HDL, LDLCALC, TRIG, CHOLHDL, LDLDIRECT in the last 72 hours. ------------------------------------------------------------------------------------------------------------------ No results for input(s): TSH, T4TOTAL, T3FREE, THYROIDAB in the last 72 hours.  Invalid input(s): FREET3 ------------------------------------------------------------------------------------------------------------------ No results for input(s): VITAMINB12, FOLATE, FERRITIN, TIBC, IRON, RETICCTPCT in the last 72 hours.  Coagulation profile  Recent Labs Lab 04/17/15 1805  INR 0.89     Recent Labs  04/17/15 1047  DDIMER 1.11*    Cardiac  Enzymes  Recent Labs Lab 04/17/15 1805  TROPONINI 0.03   ------------------------------------------------------------------------------------------------------------------ Invalid input(s): POCBNP    Etai Copado D.O. on 04/18/2015 at 11:54 AM  Between 7am to 7pm - Pager - [phone]  After 7pm go to [email or web address] - password [email or web address]  And look for the night coverage person covering for me after hours  [facility] Office  [phone]

## 2015-04-20 ENCOUNTER — Encounter: Payer: Self-pay | Admitting: Internal Medicine

## 2015-04-22 ENCOUNTER — Telehealth: Payer: Self-pay | Admitting: Family Medicine

## 2015-04-22 ENCOUNTER — Encounter: Payer: Self-pay | Admitting: Family Medicine

## 2015-04-22 ENCOUNTER — Ambulatory Visit (INDEPENDENT_AMBULATORY_CARE_PROVIDER_SITE_OTHER): Payer: BLUE CROSS/BLUE SHIELD | Admitting: Family Medicine

## 2015-04-22 VITALS — BP 129/41 | HR 64 | Wt 194.0 lb

## 2015-04-22 DIAGNOSIS — I2699 Other pulmonary embolism without acute cor pulmonale: Secondary | ICD-10-CM | POA: Diagnosis not present

## 2015-04-22 MED ORDER — RIVAROXABAN 20 MG PO TABS: 20.0000 mg | ORAL_TABLET | Freq: Every day | ORAL | Status: DC

## 2015-04-22 NOTE — Telephone Encounter (Signed)
Patient called and wanted Dr. Madilyn Fireman to know that she will need her return work date for  05-04-14 and fax to [phone]

## 2015-04-22 NOTE — Progress Notes (Signed)
   Subjective:    Patient ID: Candace Greene, female    DOB: 07/01/1970, 43 y.o.   MRN: [id number]  HPI Here for follow-up PE. She was seen on December 23 in our office for back pain and shortness of breath. She was evaluated with a CT of the chest which showed positive for pulmonary embolus and acute evolving infarct. She was admitted to the hospital and discharged the following day. She was given as a relative starter pack. No fever or cihlls. Her SOB is better. The chest pain is better.  She is on Xarelto.  She has stopped her birth control.     Review of Systems     Objective:   Physical Exam  Constitutional: She is oriented to person, place, and time. She appears well-developed and well-nourished.  HENT:  Head: Normocephalic and atraumatic.  Cardiovascular: Normal rate, regular rhythm and normal heart sounds.   Pulmonary/Chest: Effort normal and breath sounds normal.  Neurological: She is alert and oriented to person, place, and time.  Skin: Skin is warm and dry.  Psychiatric: She has a normal mood and affect. Her behavior is normal.          Assessment & Plan:  PE - Once completes her starter pack will continue with Xarelto 20mg .  printed her prescription today. I did complete the form for work indicating that she was hospitalized overnight. I'll also fill out FMLA paperwork. I think she would be safe to return to work next Wednesday on 10/17/2015. I would like to see her back in 1 month to make sure that she is doing okay. Okay to use Tylenol for pain control. Avoid any NSAIDs including aspirin products. She does occasionally use Excedrin for headaches but encouraged her to discontinue this until she comes off of the [place]. She will need a 3-6 month course of treatment. Also encouraged her to get some over-the-counter compression stockings to wear during her long work shift and when driving for some periods.

## 2015-04-23 ENCOUNTER — Encounter: Payer: Self-pay | Admitting: *Deleted

## 2015-04-23 NOTE — Telephone Encounter (Signed)
Forms completed and ready to be faxed

## 2015-04-28 ENCOUNTER — Telehealth: Payer: Self-pay

## 2015-04-28 ENCOUNTER — Ambulatory Visit (INDEPENDENT_AMBULATORY_CARE_PROVIDER_SITE_OTHER): Payer: BLUE CROSS/BLUE SHIELD | Admitting: Osteopathic Medicine

## 2015-04-28 ENCOUNTER — Encounter: Payer: Self-pay | Admitting: Osteopathic Medicine

## 2015-04-28 VITALS — BP 136/74 | HR 55 | Ht 62.0 in | Wt 192.0 lb

## 2015-04-28 DIAGNOSIS — G44209 Tension-type headache, unspecified, not intractable: Secondary | ICD-10-CM | POA: Diagnosis not present

## 2015-04-28 DIAGNOSIS — J069 Acute upper respiratory infection, unspecified: Secondary | ICD-10-CM

## 2015-04-28 MED ORDER — FLUTICASONE PROPIONATE 50 MCG/ACT NA SUSP: 2.0000 | Freq: Every day | NASAL | Status: DC

## 2015-04-28 MED ORDER — LORATADINE 10 MG PO TABS: 10.0000 mg | ORAL_TABLET | Freq: Every day | ORAL | Status: DC

## 2015-04-28 MED ORDER — ACETAMINOPHEN-CAFFEINE 500-65 MG PO CAPS: 1.0000 | ORAL_CAPSULE | Freq: Four times a day (QID) | ORAL | Status: DC | PRN

## 2015-04-28 NOTE — Patient Instructions (Addendum)
Upper Respiratory Infection, Adult Most upper respiratory infections (URIs) are a viral infection of the air passages leading to the lungs. A URI affects the nose, throat, and upper air passages. The most common type of URI is nasopharyngitis and is typically referred to as "the common cold." URIs run their course and usually go away on their own. Most of the time, a URI does not require medical attention, but sometimes a bacterial infection in the upper airways can follow a viral infection. This is called a secondary infection. Sinus and middle ear infections are common types of secondary upper respiratory infections. Bacterial pneumonia can also complicate a URI. A URI can worsen asthma and chronic obstructive pulmonary disease (COPD). Sometimes, these complications can require emergency medical care and may be life threatening.  CAUSES Almost all URIs are caused by viruses. A virus is a type of germ and can spread from one person to another.  RISKS FACTORS You may be at risk for a URI if:   You smoke.   You have chronic heart or lung disease.  You have a weakened defense (immune) system.   You are very young or very old.   You have nasal allergies or asthma.  You work in crowded or poorly ventilated areas.  You work in health care facilities or schools. SIGNS AND SYMPTOMS  Symptoms typically develop 2-3 days after you come in contact with a cold virus. Most viral URIs last 7-10 days. However, viral URIs from the influenza virus (flu virus) can last 14-18 days and are typically more severe. Symptoms may include:   Runny or stuffy (congested) nose.   Sneezing.   Cough.   Sore throat.   Headache.   Fatigue.   Fever.   Loss of appetite.   Pain in your forehead, behind your eyes, and over your cheekbones (sinus pain).  Muscle aches.  DIAGNOSIS  Your health care provider may diagnose a URI by:  Physical exam.  Tests to check that your symptoms are not due to  another condition such as:  Strep throat.  Sinusitis.  Pneumonia.  Asthma. TREATMENT  A URI goes away on its own with time. It cannot be cured with medicines, but medicines may be prescribed or recommended to relieve symptoms. Medicines may help:  Reduce your fever.  Reduce your cough.  Relieve nasal congestion. HOME CARE INSTRUCTIONS   Take medicines only as directed by your health care provider.   Gargle warm saltwater or take cough drops to comfort your throat as directed by your health care provider.  Use a warm mist humidifier or inhale steam from a shower to increase air moisture. This may make it easier to breathe.  Drink enough fluid to keep your urine clear or pale yellow.   Eat soups and other clear broths and maintain good nutrition.   Rest as needed.   Return to work when your temperature has returned to normal or as your health care provider advises. You may need to stay home longer to avoid infecting others. You can also use a face mask and careful hand washing to prevent spread of the virus.  Increase the usage of your inhaler if you have asthma.   Do not use any tobacco products, including cigarettes, chewing tobacco, or electronic cigarettes. If you need help quitting, ask your health care provider. PREVENTION  The best way to protect yourself from getting a cold is to practice good hygiene.   Avoid oral or hand contact with people with cold   symptoms.   Wash your hands often if contact occurs.  There is no clear evidence that vitamin C, vitamin E, echinacea, or exercise reduces the chance of developing a cold. However, it is always recommended to get plenty of rest, exercise, and practice good nutrition.  SEEK MEDICAL CARE IF:   You are getting worse rather than better.   Your symptoms are not controlled by medicine.   You have chills.  You have worsening shortness of breath.  You have brown or red mucus.  You have yellow or brown nasal  discharge.  You have pain in your face, especially when you bend forward.  You have a fever.  You have swollen neck glands.  You have pain while swallowing.  You have white areas in the back of your throat. SEEK IMMEDIATE MEDICAL CARE IF:   You have severe or persistent:  Headache.  Ear pain.  Sinus pain.  Chest pain.  You have chronic lung disease and any of the following:  Wheezing.  Prolonged cough.  Coughing up blood.  A change in your usual mucus.  You have a stiff neck.  You have changes in your:  Vision.  Hearing.  Thinking.  Mood. MAKE SURE YOU:   Understand these instructions.  Will watch your condition.  Will get help right away if you are not doing well or get worse.   This information is not intended to replace advice given to you by your health care provider. Make sure you discuss any questions you have with your health care provider.   Document Released: 10/05/2000 Document Revised: 08/26/2014 Document Reviewed: 07/17/2013 Elsevier Interactive Patient Education 2016 Elsevier Inc.  

## 2015-04-28 NOTE — Progress Notes (Signed)
HPI: Candace Greene is a 45 y.o. female who presents to [place] today for chief complaint of:  Chief Complaint  Patient presents with  . Headache     . Location: R side of the face and head . Quality: soreness, throbbing . Duration: 2 days  . Timing: intermittent . Context: (+) sick contacts  . Modifying factors: Tylenol not helping  . Assoc signs/symptoms: TV light aggravated this, (+) runny nose, stuffiness and sneezing, some sore throat.    Past medical, social and family history reviewed: Past Medical History  Diagnosis Date  . Heel spur     right   No past surgical history on file. Social History  Substance Use Topics  . Smoking status: Never Smoker   . Smokeless tobacco: Never Used  . Alcohol Use: No   Family History  Problem Relation Age of Onset  . Stroke Father     <60  . Birth defects Other   . Hyperlipidemia Other   . Heart attack Father   . Breast cancer Maternal Grandmother   . Breast cancer Maternal Aunt   . Cancer      GF    Current Outpatient Prescriptions  Medication Sig Dispense Refill  . cyclobenzaprine (FLEXERIL) 10 MG tablet Take 1 tablet (10 mg total) by mouth 3 (three) times daily as needed for muscle spasms. 30 tablet 0  . diclofenac sodium (VOLTAREN) 1 % GEL Apply 2 g topically 4 (four) times daily. 100 g 6  . ipratropium (ATROVENT) 0.03 % nasal spray Place 2 sprays into both nostrils every 12 (twelve) hours. 30 mL 0  . Multiple Vitamin (MULTIVITAMIN) tablet Take 1 tablet by mouth daily.    . rivaroxaban (XARELTO) 20 MG TABS tablet Take 1 tablet (20 mg total) by mouth daily with supper. 30 tablet 1  . [DISCONTINUED] fluticasone (FLONASE) 50 MCG/ACT nasal spray Place 2 sprays in each nostril once daily for allergy symptoms 16 g 1   No current facility-administered medications for this visit.   No Known Allergies    Review of Systems: CONSTITUTIONAL:  No  fever, no chills, No  unintentional weight  changes HEAD/EYES/EARS/NOSE/THROAT: Yes  headache, no vision change, no hearing change, Yes  sore throat, Yes  sinus pressure CARDIAC: No  chest pain, No  pressure, No palpitations, No  orthopnea RESPIRATORY: Yes  cough, No  shortness of breath/wheeze MUSCULOSKELETAL: Yes  myalgia/arthralgia NEUROLOGIC: No  weakness, No  dizziness, No  slurred speech    Exam:  BP 136/74 mmHg  Pulse 55  Ht 5\' 2"  (1.575 m)  Wt 192 lb (87.091 kg)  BMI 35.11 kg/m2  LMP 03/27/2015 Constitutional: VS see above. General Appearance: alert, well-developed, well-nourished, NAD Eyes: Normal lids and conjunctive, non-icteric sclera, PERRLA Ears, Nose, Mouth, Throat: MMM, Normal external inspection ears/nares/mouth/lips/gums, TM normal, posterior pharynx No  erythema No  exudate Neck: No masses, trachea midline. No thyroid enlargement/tenderness/mass appreciated. No lymphadenopathy, normal ROM Respiratory: Normal respiratory effort. no wheeze, no rhonchi, no rales Cardiovascular: S1/S2 normal, no murmur, no rub/gallop auscultated. RRR.  Neurological: No cranial nerve deficit on limited exam. Motor and sensation intact and symmetric, PERRLA, EOMI no nystagmus, neg meningeal signs    ASSESSMENT/PLAN: Nervous to take OTC meds due to recently started on bloodthinners for PE. (+)Ha most likely viral cold or tension headache, no vision changes/LOC/dizziness/weakness or other alarm signs.   Tension headache - Plan: Acetaminophen-Caffeine 500-65 MG CAPS  Viral URI - Plan: loratadine (CLARITIN) 10 MG  tablet, fluticasone (FLONASE) 50 MCG/ACT nasal spray   Return if symptoms worsen or fail to improve.

## 2015-04-28 NOTE — Telephone Encounter (Signed)
Patient called complaining of headache.  She says she has tried tylenol but nothing has helped for about 3 days.  She denies dizziness and nausea.  Appointment made for patient to be evaluated.

## 2015-05-04 ENCOUNTER — Ambulatory Visit (INDEPENDENT_AMBULATORY_CARE_PROVIDER_SITE_OTHER): Payer: BLUE CROSS/BLUE SHIELD | Admitting: Family Medicine

## 2015-05-04 ENCOUNTER — Encounter: Payer: Self-pay | Admitting: Family Medicine

## 2015-05-04 VITALS — BP 127/71 | HR 63 | Wt 199.0 lb

## 2015-05-04 DIAGNOSIS — R519 Headache, unspecified: Secondary | ICD-10-CM

## 2015-05-04 DIAGNOSIS — R51 Headache: Secondary | ICD-10-CM

## 2015-05-04 DIAGNOSIS — J019 Acute sinusitis, unspecified: Secondary | ICD-10-CM | POA: Diagnosis not present

## 2015-05-04 MED ORDER — AZITHROMYCIN 250 MG PO TABS
ORAL_TABLET | ORAL | Status: AC
Start: 2015-05-04 — End: 2015-05-09

## 2015-05-04 MED ORDER — SUMATRIPTAN SUCCINATE 100 MG PO TABS: 100.0000 mg | ORAL_TABLET | ORAL | Status: DC | PRN

## 2015-05-04 MED ORDER — AZITHROMYCIN 250 MG PO TABS: ORAL_TABLET | ORAL | Status: DC

## 2015-05-04 NOTE — Progress Notes (Signed)
   Subjective:    Patient ID: Candace Greene, female    DOB: Apr 18, 1971, 45 y.o.   MRN: [id number]  HPI Has been having severe HAs. Sxs Started before started her Xarelto about 4 weeks ago. Headaches were more infrequent but now over the last week it has been daily and severe. He had her head on the refrigerator a few days ago and that made it worse. Even waking her up at night.  Worse on the right side.   Says she has been congested in her nose for one week. She asked she saw my partners about a week ago and they felt like she had the beginning of an upper respiratory infection. She still has some congestion particularly on the right side of the nose. Has had some upper back and neck pain.  Rates her HA a 9/10.  Having some prssure in her face and sinuses.  Hx of migraines, says it is just like that.  She has gotten nauseated with them.  Senstive to loud noises.  + chills.  No fever.  Has been trying to drink more water.  Also having some upper mid back pain. - Mostly using heat pack for pain.  She denies any neurologic symptoms such as weakness of the extremities or speech changes.   Review of Systems     Objective:   Physical Exam  Constitutional: She is oriented to person, place, and time. She appears well-developed and well-nourished.  HENT:  Head: Normocephalic and atraumatic.  Right Ear: External ear normal.  Left Ear: External ear normal.  Nose: Nose normal.  Mouth/Throat: Oropharynx is clear and moist.  TMs and canals are clear. Turbinates are swollen with some blood on the right turbinate.  Eyes: Conjunctivae and EOM are normal. Pupils are equal, round, and reactive to light.  Neck: Neck supple. No thyromegaly present.  Cardiovascular: Normal rate, regular rhythm and normal heart sounds.   Pulmonary/Chest: Effort normal and breath sounds normal. She has no wheezes.  Lymphadenopathy:    She has no cervical adenopathy.  Neurological: She is alert and oriented to person, place, and  time.  Skin: Skin is warm and dry.  Psychiatric: She has a normal mood and affect.          Assessment & Plan:  Acute sinusitis- do think she could have a sinusitis. She definitely has some upper a story symptoms have been going on for a week at this point. I'm going to go ahead and treat her with azithromycin and see if this helps.  Severe HA - could be related to possible sinus infection. Though with being on Xarelto I cannot rule out a bleed on the brain causing severe headache. He did hit her head on the refrigerator a few days ago which has caused her symptoms to be worse. We'll move forward with head CT. She does have a history of migraines and feels like this headache is even worse than that. I did go ahead and give her perception for Imitrex to use as needed. I'm also concerned because the headaches have been waking her up at night.

## 2015-05-05 ENCOUNTER — Ambulatory Visit (HOSPITAL_BASED_OUTPATIENT_CLINIC_OR_DEPARTMENT_OTHER)
Admission: RE | Admit: 2015-05-05 | Discharge: 2015-05-05 | Disposition: A | Discharge status: Home / Self Care | Payer: BLUE CROSS/BLUE SHIELD | Source: Ambulatory Visit | Attending: Family Medicine | Admitting: Family Medicine

## 2015-05-05 DIAGNOSIS — R519 Headache, unspecified: Secondary | ICD-10-CM

## 2015-05-05 DIAGNOSIS — R51 Headache: Secondary | ICD-10-CM | POA: Insufficient documentation

## 2015-05-05 DIAGNOSIS — Z1231 Encounter for screening mammogram for malignant neoplasm of breast: Secondary | ICD-10-CM | POA: Diagnosis not present

## 2015-05-05 NOTE — Progress Notes (Signed)
Referring: Dr Jessee Avers   HPI: 45 year old female for evaluation of chest pain and abnormal echocardiogram. Patient recently admitted to [facility] and diagnosed with pulmonary embolus. Echocardiogram December 2016 showed normal LV systolic function, grade 1 diastolic dysfunction, trace mitral and tricuspid regurgitation. CTA December 2016 showed acute pulmonary embolus. Because of her grade 1 diastolic dysfunction cardiology was asked to evaluate. Prior to her pulmonary embolus the patient was not having dyspnea on exertion, orthopnea, PND, pedal edema, chest pain or syncope. She developed dyspnea on exertion and pleuritic chest pain at the time of her diagnosis. There is some improvement since she has been treated with anticoagulation. There is no fevers or chills or productive cough. Because of her abnormal echocardiogram and chest pain cardiology asked to evaluate.  Current Outpatient Prescriptions  Medication Sig Dispense Refill  . Acetaminophen-Caffeine 500-65 MG CAPS Take 1-2 tablets by mouth every 6 (six) hours as needed (headache). 30 capsule 1  . azithromycin (ZITHROMAX) 250 MG tablet 2 Ttabs PO on Day 1, then one a day x 4 days. 6 tablet 0  . cyclobenzaprine (FLEXERIL) 10 MG tablet Take 1 tablet (10 mg total) by mouth 3 (three) times daily as needed for muscle spasms. 30 tablet 0  . diclofenac sodium (VOLTAREN) 1 % GEL Apply 2 g topically 4 (four) times daily. 100 g 6  . fluticasone (FLONASE) 50 MCG/ACT nasal spray Place 2 sprays into both nostrils daily. 16 g 3  . ipratropium (ATROVENT) 0.03 % nasal spray Place 2 sprays into both nostrils every 12 (twelve) hours. 30 mL 0  . loratadine (CLARITIN) 10 MG tablet Take 1 tablet (10 mg total) by mouth daily. 30 tablet 11  . Multiple Vitamin (MULTIVITAMIN) tablet Take 1 tablet by mouth daily.    . rivaroxaban (XARELTO) 20 MG TABS tablet Take 1 tablet (20 mg total) by mouth daily with supper. (Patient taking differently: Take 30 mg by  mouth daily with supper. ) 30 tablet 1  . SUMAtriptan (IMITREX) 100 MG tablet Take 1 tablet (100 mg total) by mouth every 2 (two) hours as needed for migraine. May repeat in 2 hours if headache persists or recurs. 10 tablet 0   No current facility-administered medications for this visit.    No Known Allergies   Past Medical History  Diagnosis Date  . Heel spur     right  . Pulmonary embolus [facility])     Past Surgical History  Procedure Laterality Date  . No prior surgery      Social History   Social History  . Marital Status: Married    Spouse Name: N/A  . Number of Children: 2  . Years of Education: N/A   Occupational History  .      [employer]   Social History Main Topics  . Smoking status: Never Smoker   . Smokeless tobacco: Never Used  . Alcohol Use: 0.0 oz/week    0 Standard drinks or equivalent per week     Comment: 3 glasses wine per night  . Drug Use: No  . Sexual Activity: Not on file   Other Topics Concern  . Not on file   Social History Narrative    Family History  Problem Relation Age of Onset  . Stroke Father     <60  . Heart attack Father   . Birth defects Other   . Hyperlipidemia Other   . Breast cancer Maternal Grandmother   . Breast cancer Maternal Aunt   . Cancer  GF    ROS: Some headache but no fevers or chills, productive cough, hemoptysis, dysphasia, odynophagia, melena, hematochezia, dysuria, hematuria, rash, seizure activity, orthopnea, PND, pedal edema, claudication. Remaining systems are negative.  Physical Exam:   Blood pressure 136/84, pulse 69, height 5\' 2"  (1.575 m), weight 198 lb 1.9 oz (89.867 kg), last menstrual period 04/19/2015, SpO2 98 %.  General:  Well developed/well nourished in NAD Skin warm/dry Patient not depressed No peripheral clubbing Back-normal HEENT-normal/normal eyelids Neck supple/normal carotid upstroke bilaterally; no bruits; no JVD; no thyromegaly chest - CTA/ normal expansion CV - RRR/normal S1  and S2; no rubs or gallops;  PMI nondisplaced; 1/6 systolic murmur left sternal border. Abdomen -NT/ND, no HSM, no mass, + bowel sounds, no bruit 2+ femoral pulses, no bruits Ext-no edema, chords, 2+ DP Neuro-grossly nonfocal  ECG 04/17/2015-sinus rhythm with anterior T-wave inversion.

## 2015-05-06 ENCOUNTER — Encounter: Payer: Self-pay | Admitting: Cardiology

## 2015-05-06 ENCOUNTER — Ambulatory Visit (INDEPENDENT_AMBULATORY_CARE_PROVIDER_SITE_OTHER): Payer: BLUE CROSS/BLUE SHIELD | Admitting: Cardiology

## 2015-05-06 VITALS — BP 136/84 | HR 69 | Ht 62.0 in | Wt 198.1 lb

## 2015-05-06 DIAGNOSIS — R072 Precordial pain: Secondary | ICD-10-CM | POA: Diagnosis not present

## 2015-05-06 DIAGNOSIS — R931 Abnormal findings on diagnostic imaging of heart and coronary circulation: Secondary | ICD-10-CM | POA: Diagnosis not present

## 2015-05-06 DIAGNOSIS — R079 Chest pain, unspecified: Secondary | ICD-10-CM | POA: Insufficient documentation

## 2015-05-06 NOTE — Patient Instructions (Signed)
Your physician recommends that you schedule a follow-up appointment in: as needed  

## 2015-05-06 NOTE — Assessment & Plan Note (Signed)
Patient with recent pulmonary embolus. Continue xarelto. Her estrogen has been discontinued. [place] did [place] the beach in [place] and had leg injury. This may have been responsible. Further management per primary care.

## 2015-05-06 NOTE — Assessment & Plan Note (Signed)
Patient's echocardiogram showed grade 1 diastolic dysfunction. However she is not having any symptoms and is not volume overloaded on examination. She is not particularly hypertensive today. She will follow up with primary care for blood pressure checks in the future and therapy as indicated. I do not think further cardiac workup is indicated.

## 2015-05-06 NOTE — Assessment & Plan Note (Signed)
Patient hadPleuritic chest pain at time of pulmonary embolus. No further symptoms. No further cardiac workup.

## 2015-05-12 ENCOUNTER — Telehealth: Payer: Self-pay | Admitting: Family Medicine

## 2015-05-12 NOTE — Telephone Encounter (Signed)
Patient called adv that she dropped off some paper work on Friday and req to know if you can fax it to the 1-800 number on the paper. Thanks

## 2015-05-13 NOTE — Telephone Encounter (Signed)
Information faxed

## 2015-05-13 NOTE — Telephone Encounter (Signed)
Forms complete.  In inbasket. Ok to fax.

## 2015-05-13 NOTE — Telephone Encounter (Signed)
Information sent to scan.

## 2015-05-22 ENCOUNTER — Ambulatory Visit (INDEPENDENT_AMBULATORY_CARE_PROVIDER_SITE_OTHER): Payer: BLUE CROSS/BLUE SHIELD | Admitting: Family Medicine

## 2015-05-22 ENCOUNTER — Encounter: Payer: Self-pay | Admitting: Family Medicine

## 2015-05-22 VITALS — BP 119/52 | HR 79 | Wt 195.0 lb

## 2015-05-22 DIAGNOSIS — N760 Acute vaginitis: Secondary | ICD-10-CM

## 2015-05-22 DIAGNOSIS — I2699 Other pulmonary embolism without acute cor pulmonale: Secondary | ICD-10-CM

## 2015-05-22 NOTE — Addendum Note (Signed)
Addended by: Huel Cote on: 05/22/2015 12:12 PM   Modules accepted: Orders

## 2015-05-22 NOTE — Progress Notes (Signed)
   Subjective:    Patient ID: Candace Greene, female    DOB: 1971/04/13, 45 y.o.   MRN: [id number]  HPI F/U pulmonary embolism-she's doing well. She's been on Xarelto for a month at this point in time. She had some chest pain last week and while working yesterday. She says it was a discomfort almost a pressure or pinching sensation in the center of the chest. It lasted a couple of hours but then eased off by the time she got home. She says it wasn't severe. She didn't have any shortness of breath with it or diaphoresis. He says it really wasn't that painful it was more of a pressure type sensation. She is now on Xarelto 20mg  daily    Vaginal odor started yesterday. She had some spotting early this week.  Her periods haven't been regular since she had to come off of birth control.    Review of Systems     Objective:   Physical Exam  Constitutional: She is oriented to person, place, and time. She appears well-developed and well-nourished.  HENT:  Head: Normocephalic and atraumatic.  Cardiovascular: Normal rate, regular rhythm and normal heart sounds.   Pulmonary/Chest: Effort normal and breath sounds normal.  Neurological: She is alert and oriented to person, place, and time.  Skin: Skin is warm and dry.  Psychiatric: She has a normal mood and affect. Her behavior is normal.          Assessment & Plan:  PE - she is doing well overall. She's had a couple episodes of chest pain. Says not unusual after having a pulmonary embolism within the first couple of months. Gave her reassurance. Certainly if it gets more intense, persists, or shakes (shortest of breath with that and please let me know. I will see her back in 1-2 months. At that point we can decide how long she is going to be on the medication and do the additional blood work to see if she has other risk factors for hypercoagulation. Next  Vaginitis-wet prep performed. Will call for results once available.

## 2015-05-23 LAB — KOH PREP: RESULT - KOH: NONE SEEN

## 2015-05-26 ENCOUNTER — Telehealth: Payer: Self-pay

## 2015-05-26 ENCOUNTER — Telehealth: Payer: Self-pay | Admitting: *Deleted

## 2015-05-26 NOTE — Telephone Encounter (Signed)
fmla forms faxed confirmation received.Candace Greene

## 2015-05-26 NOTE — Telephone Encounter (Signed)
LVM informing pt that her FMLA forms have been completed and will be faxed to [employer] Copy made and placed in scan.Candace Greene [place]

## 2015-05-26 NOTE — Telephone Encounter (Signed)
Candace Greene reports abnormal bleeding on her cycle. She is changing pads, 2 every hour. She states she has big clots. Please advise.

## 2015-06-10 ENCOUNTER — Encounter: Payer: Self-pay | Admitting: Physician Assistant

## 2015-06-10 ENCOUNTER — Ambulatory Visit (INDEPENDENT_AMBULATORY_CARE_PROVIDER_SITE_OTHER): Payer: BLUE CROSS/BLUE SHIELD | Admitting: Physician Assistant

## 2015-06-10 VITALS — BP 118/54 | HR 58 | Temp 98.7°F | Ht 62.0 in | Wt 192.0 lb

## 2015-06-10 DIAGNOSIS — R198 Other specified symptoms and signs involving the digestive system and abdomen: Secondary | ICD-10-CM

## 2015-06-10 DIAGNOSIS — R112 Nausea with vomiting, unspecified: Secondary | ICD-10-CM | POA: Diagnosis not present

## 2015-06-10 DIAGNOSIS — R10816 Epigastric abdominal tenderness: Secondary | ICD-10-CM | POA: Insufficient documentation

## 2015-06-10 DIAGNOSIS — R197 Diarrhea, unspecified: Secondary | ICD-10-CM | POA: Diagnosis not present

## 2015-06-10 DIAGNOSIS — R0789 Other chest pain: Secondary | ICD-10-CM

## 2015-06-10 DIAGNOSIS — R10817 Generalized abdominal tenderness: Secondary | ICD-10-CM

## 2015-06-10 MED ORDER — OMEPRAZOLE 40 MG PO CPDR: 40.0000 mg | DELAYED_RELEASE_CAPSULE | Freq: Every day | ORAL | Status: DC

## 2015-06-10 MED ORDER — RANITIDINE HCL 150 MG PO TABS
150.0000 mg | ORAL_TABLET | Freq: Two times a day (BID) | ORAL | Status: DC
Start: 2015-06-10 — End: 2016-03-06

## 2015-06-10 NOTE — Patient Instructions (Signed)
Start omeprazole daily and zantac twice daily.  Get labs.   Gastritis, Adult Gastritis is soreness and swelling (inflammation) of the lining of the stomach. Gastritis can develop as a sudden onset (acute) or long-term (chronic) condition. If gastritis is not treated, it can lead to stomach bleeding and ulcers. CAUSES  Gastritis occurs when the stomach lining is weak or damaged. Digestive juices from the stomach then inflame the weakened stomach lining. The stomach lining may be weak or damaged due to viral or bacterial infections. One common bacterial infection is the Helicobacter pylori infection. Gastritis can also result from excessive alcohol consumption, taking certain medicines, or having too much acid in the stomach.  SYMPTOMS  In some cases, there are no symptoms. When symptoms are present, they may include:  Pain or a burning sensation in the upper abdomen.  Nausea.  Vomiting.  An uncomfortable feeling of fullness after eating. DIAGNOSIS  Your caregiver may suspect you have gastritis based on your symptoms and a physical exam. To determine the cause of your gastritis, your caregiver may perform the following:  Blood or stool tests to check for the H pylori bacterium.  Gastroscopy. A thin, flexible tube (endoscope) is passed down the esophagus and into the stomach. The endoscope has a light and camera on the end. Your caregiver uses the endoscope to view the inside of the stomach.  Taking a tissue sample (biopsy) from the stomach to examine under a microscope. TREATMENT  Depending on the cause of your gastritis, medicines may be prescribed. If you have a bacterial infection, such as an H pylori infection, antibiotics may be given. If your gastritis is caused by too much acid in the stomach, H2 blockers or antacids may be given. Your caregiver may recommend that you stop taking aspirin, ibuprofen, or other nonsteroidal anti-inflammatory drugs (NSAIDs). HOME CARE INSTRUCTIONS  Only  take over-the-counter or prescription medicines as directed by your caregiver.  If you were given antibiotic medicines, take them as directed. Finish them even if you start to feel better.  Drink enough fluids to keep your urine clear or pale yellow.  Avoid foods and drinks that make your symptoms worse, such as:  Caffeine or alcoholic drinks.  Chocolate.  Peppermint or mint flavorings.  Garlic and onions.  Spicy foods.  Citrus fruits, such as oranges, lemons, or limes.  Tomato-based foods such as sauce, chili, salsa, and pizza.  Fried and fatty foods.  Eat small, frequent meals instead of large meals. SEEK IMMEDIATE MEDICAL CARE IF:   You have black or dark red stools.  You vomit blood or material that looks like coffee grounds.  You are unable to keep fluids down.  Your abdominal pain gets worse.  You have a fever.  You do not feel better after 1 week.  You have any other questions or concerns. MAKE SURE YOU:  Understand these instructions.  Will watch your condition.  Will get help right away if you are not doing well or get worse.   This information is not intended to replace advice given to you by your health care provider. Make sure you discuss any questions you have with your health care provider.   Document Released: 04/05/2001 Document Revised: 10/11/2011 Document Reviewed: 05/25/2011 Elsevier Interactive Patient Education [employer].

## 2015-06-10 NOTE — Progress Notes (Addendum)
Subjective:    Patient ID: Candace Greene, female    DOB: Sep 15, 1970, 45 y.o.   MRN: [id number]  HPI  Patient is a 45 year old female that presented with non-radiating suprasternal chest pain that awoke her from sleep last night. Patient has a past medical history relevant for pulmonary embolism. Additional concerns for this visit include right wrist pain and associated muscle tenderness. Patient states that chest pain felt sharp and was accompanied by sweating and one episode of vomiting. Patient had diarrhea this morning. Patient states that she has experienced similar episodes lasting approximately 30 minutes over the last two months. Patient states that pain is worsened when taking a deep breath. Patient states that pain is not alleviated or exacerbated by changes in positioning. Patient states that she consumed scallops last night, which is a food that she does not typically eat. Patient denies shortness of breath, hemoptysis, fever, and back pain. Patient denies prolonged periods of immobility, recent travel, and contraceptive use.  Patient has not attempted medications to relieve her pain.   Patient states that right wrist pain has been addressed with orthopedics in the past. Patient states that she experiences "achy" wrist pain when she performs activities that involve extension of the right wrist. Patient denies numbness and tingling in the digits. Patient denies increased incidence of dropping items.   Review of Systems Please see HPI.    Objective:   Physical Exam  Constitutional: She is oriented to person, place, and time. She appears well-developed and well-nourished. No distress.  HENT:  Head: Normocephalic and atraumatic.  Nose: Nose normal.  Mouth/Throat: Oropharynx is clear and moist.  Patient's tympanic membranes are pearly with bony landmarks visible and without erythema bilaterally.    Eyes: Conjunctivae and EOM are normal. Pupils are equal, round, and reactive to light.  Right eye exhibits no discharge. Left eye exhibits no discharge.  Neck: Normal range of motion. Neck supple. No tracheal deviation present. No thyromegaly present.  Cardiovascular: Normal rate, regular rhythm, normal heart sounds and intact distal pulses.  Exam reveals no gallop and no friction rub.   No murmur heard. Pulmonary/Chest: Effort normal and breath sounds normal. No respiratory distress. She has no wheezes. She has no rales. She exhibits no tenderness.  No CVA tenderness.   Abdominal: Soft. She exhibits no distension. There is no rebound.  Patient exhibits tenderness along the epigastric region with guarding. Patient's bowl sounds are positive in all four quadrants.  Generalized tenderness without guarding or rebound in left lower quadrant.  Musculoskeletal:  Patient has a hard, immobile 2x2 cm mass at the dorsal aspect of her right wrist. Patient states that she had an ultrasound and mass was considered "normal". Patient has compromised extension of the right wrist but range of motion was otherwise within normal range.   Lymphadenopathy:    She has no cervical adenopathy.  Neurological: She is alert and oriented to person, place, and time. She has normal reflexes. No cranial nerve deficit.  Skin: Skin is warm and dry. She is not diaphoretic.  Psychiatric: She has a normal mood and affect. Her behavior is normal. Judgment and thought content normal.      Assessment & Plan:   1. Suprasternal Chest Pain/epigastric tenderness with guarding/generalized abdominal pain/nausea/diarrhea- Differential diagnosis includes MI, gastroitis, diverticulitis, and pancreatitis. Patient has non-radiating suprasternal chest pain lasting 30 minutes or less without jaw or left arm involvement occuring intermittently for the past two months. Patient's most recent episode of chest pain  was accompanied by one episode of vomiting and diaphoresis. The chronicity of patient's symptoms and patient's in office ECG  revealing sinus bradycardia but no ST segment elevation, decreased index of suspicion for ST segment elevation MI. Patient has been under the care of cardiology with echocardiography being within normal limits. Because patient experienced epigastric pain to palpation during physical exam, patient will undergo testing to assess for H. Pylori. H.Pylori results will be monitored and patient will be treated pending results.  Patient has some pain in the left lower quadrant. A CBC was ordered to assess for elevated WBC, which might help to rule in a possible diagnosis of diverticulitis.  Index of suspicion is low for pancreatitis as patient has experienced no pain radiating to the back. However, a lipase was ordered to rule out pancreatitis. Will follow up on labs. Patient will be treated with Zantac BID and Omeprazole once daily for the next month. Patient was given GI cocktail in office.   2. Right wrist tendonitis Patient was advised to follow up with sports medicine for right wrist pain. Patient was advised at her last ortho visit to follow up for an injection for wrist tendonitis.

## 2015-06-11 LAB — CBC WITH DIFFERENTIAL/PLATELET
BASOS PCT: 0 % (ref 0–1)
Basophils Absolute: 0 10*3/uL (ref 0.0–0.1)
EOS ABS: 0.1 10*3/uL (ref 0.0–0.7)
EOS PCT: 1 % (ref 0–5)
HCT: 36.4 % (ref 36.0–46.0)
HEMOGLOBIN: 11.9 g/dL — AB (ref 12.0–15.0)
Lymphocytes Relative: 34 % (ref 12–46)
Lymphs Abs: 2.1 10*3/uL (ref 0.7–4.0)
MCH: 31 pg (ref 26.0–34.0)
MCHC: 32.7 g/dL (ref 30.0–36.0)
MCV: 94.8 fL (ref 78.0–100.0)
MONO ABS: 0.4 10*3/uL (ref 0.1–1.0)
MONOS PCT: 6 % (ref 3–12)
MPV: 10.6 fL (ref 8.6–12.4)
NEUTROS ABS: 3.6 10*3/uL (ref 1.7–7.7)
Neutrophils Relative %: 59 % (ref 43–77)
PLATELETS: 351 10*3/uL (ref 150–400)
RBC: 3.84 MIL/uL — AB (ref 3.87–5.11)
RDW: 15 % (ref 11.5–15.5)
WBC: 6.1 10*3/uL (ref 4.0–10.5)

## 2015-06-11 LAB — COMPLETE METABOLIC PANEL WITH GFR
ALT: 11 U/L (ref 6–29)
AST: 18 U/L (ref 10–30)
Albumin: 3.9 g/dL (ref 3.6–5.1)
Alkaline Phosphatase: 45 U/L (ref 33–115)
BUN: 9 mg/dL (ref 7–25)
CHLORIDE: 105 mmol/L (ref 98–110)
CO2: 24 mmol/L (ref 20–31)
Calcium: 8.7 mg/dL (ref 8.6–10.2)
Creat: 0.96 mg/dL (ref 0.50–1.10)
GFR, EST NON AFRICAN AMERICAN: 72 mL/min (ref >=60)
GFR, Est African American: 83 mL/min (ref >=60)
GLUCOSE: 89 mg/dL (ref 65–99)
POTASSIUM: 4.1 mmol/L (ref 3.5–5.3)
SODIUM: 138 mmol/L (ref 135–146)
Total Bilirubin: 0.5 mg/dL (ref 0.2–1.2)
Total Protein: 7.4 g/dL (ref 6.1–8.1)

## 2015-06-11 LAB — LIPASE: Lipase: 65 U/L — ABNORMAL HIGH (ref 7–60)

## 2015-06-11 LAB — H. PYLORI BREATH TEST: H. pylori Breath Test: NOT DETECTED

## 2015-06-15 ENCOUNTER — Ambulatory Visit (INDEPENDENT_AMBULATORY_CARE_PROVIDER_SITE_OTHER): Payer: BLUE CROSS/BLUE SHIELD

## 2015-06-15 ENCOUNTER — Ambulatory Visit (INDEPENDENT_AMBULATORY_CARE_PROVIDER_SITE_OTHER): Payer: BLUE CROSS/BLUE SHIELD | Admitting: Physician Assistant

## 2015-06-15 ENCOUNTER — Ambulatory Visit: Payer: BLUE CROSS/BLUE SHIELD

## 2015-06-15 ENCOUNTER — Encounter: Payer: Self-pay | Admitting: Physician Assistant

## 2015-06-15 VITALS — BP 123/63 | HR 65 | Ht 62.0 in | Wt 195.0 lb

## 2015-06-15 DIAGNOSIS — R748 Abnormal levels of other serum enzymes: Secondary | ICD-10-CM

## 2015-06-15 DIAGNOSIS — N83201 Unspecified ovarian cyst, right side: Secondary | ICD-10-CM | POA: Diagnosis not present

## 2015-06-15 DIAGNOSIS — R10816 Epigastric abdominal tenderness: Secondary | ICD-10-CM | POA: Diagnosis not present

## 2015-06-15 DIAGNOSIS — R198 Other specified symptoms and signs involving the digestive system and abdomen: Secondary | ICD-10-CM

## 2015-06-15 DIAGNOSIS — R0789 Other chest pain: Secondary | ICD-10-CM | POA: Diagnosis not present

## 2015-06-15 DIAGNOSIS — N83202 Unspecified ovarian cyst, left side: Secondary | ICD-10-CM

## 2015-06-15 DIAGNOSIS — R1084 Generalized abdominal pain: Secondary | ICD-10-CM | POA: Diagnosis not present

## 2015-06-15 MED ORDER — IOHEXOL 300 MG/ML  SOLN
100.0000 mL | Freq: Once | INTRAMUSCULAR | Status: AC | PRN
  Administered 2015-06-15: 100 mL via INTRAVENOUS

## 2015-06-15 NOTE — Progress Notes (Addendum)
   Subjective:    Patient ID: Candace Greene, female    DOB: 07-19-1970, 45 y.o.   MRN: [id number]  HPI   Patient is a 45 year old female that returns to the clinic and continues to present with sharp, non-radiating suprasternal chest pain for the past two months. Patient presented to clinic on 06/10/15 with similar symptoms and was treated with zantac and omeprazole for suspected gastritis after epigastric abdominal pain was discovered on physical exam. H. Pylori was negative. Normal EKG.Patient reports no improvement in her symptoms. Since 06/10/15, patient continues to have episodes of chest pain that lasts for thirty minutes and requires rest. Patient states that pain is alleviated slightly when she leans forward. Patient denies increased anxiety. Hx of PE and on xaralto.   Review of Systems Please see HPI.     Objective:   Physical Exam  Constitutional: She is oriented to person, place, and time. She appears well-developed and well-nourished.  HENT:  Head: Normocephalic and atraumatic.  Right Ear: External ear normal.  Left Ear: External ear normal.  Nose: Nose normal.  Mouth/Throat: Oropharynx is clear and moist.  Patient's tympanic membranes are pearly with bony landmarks visible and without erythema bilaterally.    Eyes: Conjunctivae and EOM are normal. Pupils are equal, round, and reactive to light.  Neck: Normal range of motion. No thyromegaly present.  Cardiovascular: Normal rate, regular rhythm, normal heart sounds and intact distal pulses.   Pulmonary/Chest: Effort normal and breath sounds normal. No respiratory distress. She has no wheezes. She has no rales. She exhibits no tenderness.  Tenderness to palpation over sternum.   No CVA tenderness.   Abdominal: Soft. Bowel sounds are normal. She exhibits no distension and no mass. There is no rebound.  Patient has epigastric abdominal pain due to palpation with guarding and diffuse tenderness without guarding in all four  quadrants.   Musculoskeletal: Normal range of motion.  Lymphadenopathy:    She has no cervical adenopathy.  Neurological: She is alert and oriented to person, place, and time.  Skin: Skin is warm and dry. No rash noted. No erythema.  Psychiatric: She has a normal mood and affect. Her behavior is normal. Judgment and thought content normal.      Assessment & Plan:   1. Suprasternal atypical Chest Pain.  Patient presents with non-radiating, sharp, suprasternal chest pain occuring intermittently and lasting for thirty minutes. A chest x ray and ESR and CBC was ordered. Some of symptoms are concerning for pericarditis. Patient's labs/imaging will be monitored. Patient was advised to follow up with Cardiology for stress testing she has already been establish with.   2. Epigastric Abdominal Pain  Patient presents with epigastric pain and diffuse abdominal tenderness with abdominal guarding in epigastric area. Patient states that omeprazole and zantac are not alleviating her symptoms. A CT of the abdomen was ordered. Patient's lipase from 06/10/15 was slightly elevated (65). Patient's lipase will be repeated. Patient's CBC will be repeated.

## 2015-06-15 NOTE — Addendum Note (Signed)
Addended by: Donella Stade on: 06/15/2015 05:21 PM   Modules accepted: Level of Service

## 2015-06-15 NOTE — Patient Instructions (Signed)
Make appt with cardiology 

## 2015-06-16 ENCOUNTER — Encounter: Payer: Self-pay | Admitting: Physician Assistant

## 2015-06-16 DIAGNOSIS — D259 Leiomyoma of uterus, unspecified: Secondary | ICD-10-CM | POA: Insufficient documentation

## 2015-06-16 DIAGNOSIS — N83209 Unspecified ovarian cyst, unspecified side: Secondary | ICD-10-CM | POA: Insufficient documentation

## 2015-07-03 ENCOUNTER — Encounter: Payer: Self-pay | Admitting: Osteopathic Medicine

## 2015-07-03 ENCOUNTER — Ambulatory Visit (INDEPENDENT_AMBULATORY_CARE_PROVIDER_SITE_OTHER): Payer: BLUE CROSS/BLUE SHIELD | Admitting: Osteopathic Medicine

## 2015-07-03 VITALS — BP 96/62 | HR 55 | Ht 62.0 in | Wt 197.0 lb

## 2015-07-03 DIAGNOSIS — B373 Candidiasis of vulva and vagina: Secondary | ICD-10-CM | POA: Diagnosis not present

## 2015-07-03 DIAGNOSIS — B3731 Acute candidiasis of vulva and vagina: Secondary | ICD-10-CM

## 2015-07-03 MED ORDER — FLUCONAZOLE 150 MG PO TABS
ORAL_TABLET | ORAL | Status: DC
Start: 2015-07-03 — End: 2015-07-20

## 2015-07-03 NOTE — Progress Notes (Signed)
HPI: Candace Greene is a 45 y.o. female who presents to [place] today for chief complaint of:  Chief Complaint  Patient presents with  . Vaginitis     . Location: vagina/groin . Quality: itching . Severity: severe . Duration: started few days after antibiotics for dental procedure . Context: on Amoxicillin . Assoc signs/symptoms: no abn bleeding/discharge   Past medical, social and family history reviewed: Past Medical History  Diagnosis Date  . Heel spur     right  . Pulmonary embolus [facility])    Past Surgical History  Procedure Laterality Date  . No prior surgery     Social History  Substance Use Topics  . Smoking status: Never Smoker   . Smokeless tobacco: Never Used  . Alcohol Use: 0.0 oz/week    0 Standard drinks or equivalent per week     Comment: 3 glasses wine per night   Family History  Problem Relation Age of Onset  . Stroke Father     <60  . Heart attack Father   . Birth defects Other   . Hyperlipidemia Other   . Breast cancer Maternal Grandmother   . Breast cancer Maternal Aunt   . Cancer      GF    Current Outpatient Prescriptions  Medication Sig Dispense Refill  . Acetaminophen-Caffeine 500-65 MG CAPS Take 1-2 tablets by mouth every 6 (six) hours as needed (headache). 30 capsule 1  . diclofenac sodium (VOLTAREN) 1 % GEL Apply 2 g topically 4 (four) times daily. 100 g 6  . fluticasone (FLONASE) 50 MCG/ACT nasal spray Place 2 sprays into both nostrils daily. 16 g 3  . Multiple Vitamin (MULTIVITAMIN) tablet Take 1 tablet by mouth daily.    Marland Kitchen omeprazole (PRILOSEC) 40 MG capsule Take 1 capsule (40 mg total) by mouth daily. 30 capsule 3  . ranitidine (ZANTAC) 150 MG tablet Take 1 tablet (150 mg total) by mouth 2 (two) times daily. 60 tablet 1  . rivaroxaban (XARELTO) 20 MG TABS tablet Take 1 tablet (20 mg total) by mouth daily with supper. 30 tablet 1  . SUMAtriptan (IMITREX) 100 MG tablet Take 1 tablet (100 mg  total) by mouth every 2 (two) hours as needed for migraine. May repeat in 2 hours if headache persists or recurs. 10 tablet 0  . fluconazole (DIFLUCAN) 150 MG tablet Take 1 tablet (150 mg total) by mouth once. Repeat this dose every 72 hours if symptoms are not resolved. 4 tablet 0   No current facility-administered medications for this visit.   No Known Allergies    Review of Systems: CONSTITUTIONAL:  No  fever, no chills, CARDIAC: No  chest pain,  RESPIRATORY: No  cough, No  shortness of breath/wheeze GASTROINTESTINAL: No  nausea, No  vomiting, No  abdominal pain,  MUSCULOSKELETAL: No  myalgia/arthralgia GENITOURINARY: No  incontinence, No  abnormal genital bleeding/discharge, (+) itching as per HPI SKIN: No  rash/wounds/concerning lesions   Exam:  BP 96/62 mmHg  Pulse 55  Ht 5\' 2"  (1.575 m)  Wt 197 lb (89.359 kg)  BMI 36.02 kg/m2  LMP 05/20/2015 Constitutional: VS see above. General Appearance: alert, well-developed, well-nourished, NAD Neck: No masses, trachea midline. No thyroid enlargement/tenderness/mass appreciated. No lymphadenopathy Respiratory: Normal respiratory effort Psychiatric: Normal judgment/insight. Normal mood and affect.    No results found for this or any previous visit (from the past 72 hour(s)).    ASSESSMENT/PLAN: Ok to try Diflucan q 3 days over course of  10 days on amoxicillin for dental procedure, can also suggest call dentist see if alternative abx may be appropriate, see pt instructions, if no beter after abx done may consdier vaginal exam  Vaginal candida - Plan: fluconazole (DIFLUCAN) 150 MG tablet  Return if symptoms worsen or fail to improve, and as directed by Dr Madilyn Fireman for routine care.

## 2015-07-03 NOTE — Patient Instructions (Signed)
SEE PRESCRIPTION BOTTLE FOR INSTRUCTIONS ON HOW TO TAKE THE DIFLUCAN FOR YEAST INFECTION. IF YOUR YEAST INFECTION PERSISTS AFTER YOUR ANTIBIOTICS ARE DONE, PLEASE CALL THE OFFICE AND LET us KNOW IF YOU NEED FURTHER TREATMENT. IF YOUR SYMPTOMS BECOME SEVERE WE MAY ASK YOU TO COME BACK TO THE OFFICE FOR A VAGINAL EXAM TO ENSURE NO OTHER CAUSE OF YOUR SYMPTOMS.   TAKE CARE, AND LET us KNOW IF THERE'S ANYTHING ELSE WE CAN DO FOR YOU! -DR. A.

## 2015-07-05 ENCOUNTER — Other Ambulatory Visit: Payer: Self-pay | Admitting: Family Medicine

## 2015-07-13 ENCOUNTER — Encounter: Payer: Self-pay | Admitting: Family Medicine

## 2015-07-13 ENCOUNTER — Ambulatory Visit (INDEPENDENT_AMBULATORY_CARE_PROVIDER_SITE_OTHER): Payer: BLUE CROSS/BLUE SHIELD | Admitting: Family Medicine

## 2015-07-13 VITALS — BP 122/64 | HR 59 | Temp 98.2°F | Wt 199.0 lb

## 2015-07-13 DIAGNOSIS — J301 Allergic rhinitis due to pollen: Secondary | ICD-10-CM

## 2015-07-13 DIAGNOSIS — J0100 Acute maxillary sinusitis, unspecified: Secondary | ICD-10-CM | POA: Diagnosis not present

## 2015-07-13 MED ORDER — AMOXICILLIN-POT CLAVULANATE 875-125 MG PO TABS: 1.0000 | ORAL_TABLET | Freq: Two times a day (BID) | ORAL | Status: DC

## 2015-07-13 NOTE — Patient Instructions (Signed)

## 2015-07-13 NOTE — Progress Notes (Signed)
   Subjective:    Patient ID: Candace Greene, female    DOB: 1971-03-08, 45 y.o.   MRN: [id number]  HPI  Has had a lot of sinus congestion and watering eyes x 1 right.  She feels like the congestion is around her right eye. She hasn't been able to sleep because of it. She's also had some soreness in her throat but just on the right side and her right ear started hurting on Saturday. She feels like it's blocked. No nausea vomiting or diarrhea. No fevers. She has had a little bit of posterior right neck pain.  Review of Systems     Objective:   Physical Exam  Constitutional: She is oriented to person, place, and time. She appears well-developed and well-nourished.  HENT:  Head: Normocephalic and atraumatic.  Right Ear: External ear normal.  Left Ear: External ear normal.  Nose: Nose normal.  Mouth/Throat: Oropharynx is clear and moist.  TMs and canals are clear.   Eyes: Conjunctivae and EOM are normal. Pupils are equal, round, and reactive to light.  Neck: Neck supple. No thyromegaly present.  Cardiovascular: Normal rate, regular rhythm and normal heart sounds.   Pulmonary/Chest: Effort normal and breath sounds normal. She has no wheezes.  Lymphadenopathy:    She has no cervical adenopathy.  Neurological: She is alert and oriented to person, place, and time.  Skin: Skin is warm and dry.  Psychiatric: She has a normal mood and affect.          Assessment & Plan:  Acute maxillary sinusitis, right side-we'll treat with Augmentin. Recommend addition of nasal steroid sprays she does have problems with spring allergies.

## 2015-07-20 ENCOUNTER — Ambulatory Visit (INDEPENDENT_AMBULATORY_CARE_PROVIDER_SITE_OTHER): Payer: BLUE CROSS/BLUE SHIELD | Admitting: Family Medicine

## 2015-07-20 ENCOUNTER — Encounter: Payer: Self-pay | Admitting: Family Medicine

## 2015-07-20 VITALS — BP 130/55 | HR 58 | Wt 197.0 lb

## 2015-07-20 DIAGNOSIS — I2609 Other pulmonary embolism with acute cor pulmonale: Secondary | ICD-10-CM

## 2015-07-20 DIAGNOSIS — J0100 Acute maxillary sinusitis, unspecified: Secondary | ICD-10-CM

## 2015-07-20 MED ORDER — RIVAROXABAN 20 MG PO TABS: ORAL_TABLET | ORAL | Status: DC

## 2015-07-20 MED ORDER — AZITHROMYCIN 250 MG PO TABS: ORAL_TABLET | ORAL | Status: AC

## 2015-07-20 NOTE — Progress Notes (Addendum)
   Subjective:    Patient ID: Candace Greene, female    DOB: [birth year], 45 y.o.   MRN: [id number]  HPI History of pulmonary embolism-she was diagnosed on December 23 and started on several toe. She has been on treatment for 4 months at this point. Overall she is doing much better. She was still having some intermittent chest pain for the first couple of months.  I saw her approximate days ago for sinusitis particularly on the right side. She took the Augmentin and still has a couple of days left. She is only slightly better. She still feels very congested and is still getting a lot of yellow discharge from the right nostril as well as some persistent headaches with it. No nausea vomiting or diarrhea. She started to feel worse again 2 days ago.  She is also using her Nasonex.   Review of Systems     Objective:   Physical Exam  Constitutional: She is oriented to person, place, and time. She appears well-developed and well-nourished.  HENT:  Head: Normocephalic and atraumatic.  Right Ear: External ear normal.  Left Ear: External ear normal.  Nose: Nose normal.  Mouth/Throat: Oropharynx is clear and moist.  TMs and canals are clear.   Eyes: Conjunctivae and EOM are normal. Pupils are equal, round, and reactive to light.  Neck: Neck supple. No thyromegaly present.  Cardiovascular: Normal rate, regular rhythm and normal heart sounds.   Pulmonary/Chest: Effort normal and breath sounds normal. She has no wheezes.  Lymphadenopathy:    She has no cervical adenopathy.  Neurological: She is alert and oriented to person, place, and time.  Skin: Skin is warm and dry.  Psychiatric: She has a normal mood and affect.          Assessment & Plan:  Pulmonary embolism-most likely triggered by birth control and extended travel in a car. We discussed options. She could certainly come off the Xarelto at this point. She would prefer to complete a full 6 months. I'll see her back in 2 months at that  time. Refills sent to the pharmacy.  Acute sinusitis, right-sided-not responding to Augmentin. Complete the last 3 days of Augmentin. Will treat with azithromycin. I'll if not better in one week.

## 2015-08-14 ENCOUNTER — Ambulatory Visit (INDEPENDENT_AMBULATORY_CARE_PROVIDER_SITE_OTHER): Payer: BLUE CROSS/BLUE SHIELD | Admitting: Physician Assistant

## 2015-08-14 ENCOUNTER — Encounter: Payer: Self-pay | Admitting: *Deleted

## 2015-08-14 VITALS — BP 114/53 | HR 60

## 2015-08-14 DIAGNOSIS — R030 Elevated blood-pressure reading, without diagnosis of hypertension: Secondary | ICD-10-CM

## 2015-08-14 DIAGNOSIS — IMO0001 Reserved for inherently not codable concepts without codable children: Secondary | ICD-10-CM

## 2015-08-14 NOTE — Progress Notes (Signed)
Work note printed.  

## 2015-08-14 NOTE — Progress Notes (Signed)
Pt presents to clinic for BP check. BP was well within normal range.  She denies any headache, dizziness, blurred vision.  Pt is asking for a note to return back to work on Monday. -[facility]

## 2015-08-14 NOTE — Addendum Note (Signed)
Addended by: Delrae Alfred on: 08/14/2015 03:32 PM   Modules accepted: Miquel Dunn

## 2015-09-01 ENCOUNTER — Encounter: Payer: Self-pay | Admitting: Physician Assistant

## 2015-09-01 ENCOUNTER — Ambulatory Visit (INDEPENDENT_AMBULATORY_CARE_PROVIDER_SITE_OTHER): Payer: BLUE CROSS/BLUE SHIELD

## 2015-09-01 ENCOUNTER — Ambulatory Visit (INDEPENDENT_AMBULATORY_CARE_PROVIDER_SITE_OTHER): Payer: BLUE CROSS/BLUE SHIELD | Admitting: Physician Assistant

## 2015-09-01 VITALS — BP 148/58 | HR 67 | Ht 62.0 in | Wt 200.0 lb

## 2015-09-01 DIAGNOSIS — D259 Leiomyoma of uterus, unspecified: Secondary | ICD-10-CM | POA: Diagnosis not present

## 2015-09-01 DIAGNOSIS — N938 Other specified abnormal uterine and vaginal bleeding: Secondary | ICD-10-CM | POA: Insufficient documentation

## 2015-09-01 DIAGNOSIS — D509 Iron deficiency anemia, unspecified: Secondary | ICD-10-CM

## 2015-09-01 DIAGNOSIS — F5089 Other specified eating disorder: Secondary | ICD-10-CM | POA: Diagnosis not present

## 2015-09-01 DIAGNOSIS — I2699 Other pulmonary embolism without acute cor pulmonale: Secondary | ICD-10-CM

## 2015-09-01 DIAGNOSIS — N921 Excessive and frequent menstruation with irregular cycle: Secondary | ICD-10-CM

## 2015-09-01 LAB — POCT HEMOGLOBIN: Hemoglobin: 8.5 g/dL — AB (ref 12.2–16.2)

## 2015-09-01 MED ORDER — FERROUS SULFATE 325 (65 FE) MG PO TBEC: 325.0000 mg | DELAYED_RELEASE_TABLET | Freq: Three times a day (TID) | ORAL | Status: DC

## 2015-09-01 MED ORDER — MEDROXYPROGESTERONE ACETATE 10 MG PO TABS: 10.0000 mg | ORAL_TABLET | Freq: Every day | ORAL | Status: DC

## 2015-09-01 NOTE — Progress Notes (Addendum)
   Subjective:    Patient ID: Candace Greene, female    DOB: 1970/05/31, 45 y.o.   MRN: [id number]  HPI Patient is here today complaining of dizziness and blurry vision. She also reports she has been very tired for the past several weeks. She denies loss of consciousness and shortness of breath. She denies gum bleeding. She started on a blood thinner in December 2016 for bilateral pulmonary embolisms.  She states she has been having increasingly heavy periods since starting the blood thinner. This past menstrual cycle, she bled for three weeks, stopped bleeding for three days, and then started bleeding again and has been bleeding for one week now. She states it is not heavy, but she is passing clots. She is changing super over night pads 4-5 times/day. She denies abdominal pain, cramps, and painful periods. Denies pain with intercourse. She started taking an OTC iron supplement 3 days ago. She is craving corn starch.    Review of Systems  All other systems reviewed and are negative.      Objective:   Physical Exam  Constitutional: She appears well-developed and well-nourished.  HENT:  Head: Normocephalic and atraumatic.  Cardiovascular: Normal rate, regular rhythm and normal heart sounds.   Pulmonary/Chest: Effort normal and breath sounds normal. No respiratory distress. She has no wheezes. She has no rales. She exhibits no tenderness.  Skin: Skin is warm and dry.  Psychiatric: She has a normal mood and affect. Her behavior is normal. Thought content normal.          Assessment & Plan:  1. Iron Deficiency Anemia/Pica- Patient presents with complaints of dizziness, headaches, and blurry vision. She states she has been craving corn starch. Hgb in office was 8.5. Iron deficiency is likely due to excessive vaginal bleeding. I am going to order CBC and Ferritin. She is going to start Ferrous Sulfate three times daily. Discussion SE's of ferrous sulfate. Use with colace and/or miralax. Follow  up in 2 weeks with PCP.   2. Menorrhagia with irregular cycle/Dysfunctional Uterine bleeding- Patient reports heavy periods for the past 5 months. Her last menstrual cycle was abnormally long lasting three weeks, stopping three days, and then starting again for the past week. I am ordering a transvaginal ultrasound and TSH. Recommend that patient stop taking Xarelto.   3. 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 Started Provera for the next month to stop the bleeding. She is not a candidate for birth control. Discussed GYN referral for fibroid removal.   4.hx of PE- likely due to estrogen in OCP. Been on for 5 months. Discussed she can come off. She is ready to do so today. Will stop xarelto. Will follow up in 2 weeks with PCP.

## 2015-09-02 ENCOUNTER — Telehealth: Payer: Self-pay | Admitting: Family Medicine

## 2015-09-02 NOTE — Telephone Encounter (Signed)
Patient called and request to get a return note for tomorrow instead of going back to day. She woke up still dizzy and really bad headache so she is going to try to go back tomorrow 09/03/15. Can you or the front off give her a new work note that says out of work for 5/9 and 5/10 and return back 09/03/15. Please adv

## 2015-09-02 NOTE — Telephone Encounter (Signed)
Ok for work note? 

## 2015-09-08 ENCOUNTER — Encounter: Payer: Self-pay | Admitting: Family Medicine

## 2015-09-08 ENCOUNTER — Ambulatory Visit (INDEPENDENT_AMBULATORY_CARE_PROVIDER_SITE_OTHER): Payer: BLUE CROSS/BLUE SHIELD | Admitting: Family Medicine

## 2015-09-08 VITALS — BP 125/82 | HR 65 | Wt 199.0 lb

## 2015-09-08 DIAGNOSIS — F5089 Other specified eating disorder: Secondary | ICD-10-CM | POA: Diagnosis not present

## 2015-09-08 DIAGNOSIS — D509 Iron deficiency anemia, unspecified: Secondary | ICD-10-CM

## 2015-09-08 NOTE — Progress Notes (Signed)
       Candace Greene is a 45 y.o. female who presents to [facility] today for follow-up cramping and heavy menstrual bleeding. Patient was seen last week for heavy menstrual bleeding associated with menstrual cramping. Candace Greene Xarelto was discontinued and the bleeding has stopped. Candace Greene additionally has had cessation of Candace Greene painful menstrual cramping. At the last visit Candace Greene was found to be significantly anemic with a hemoglobin of 8.5 on a point-of-care test. Candace Greene never did the serum blood testing however. Candace Greene does note some continued lightheadedness and fatigue. Patient currently takes oral iron.   Past Medical History  Diagnosis Date  . Heel spur     right  . Pulmonary embolus [facility])    Past Surgical History  Procedure Laterality Date  . No prior surgery     Social History  Substance Use Topics  . Smoking status: Never Smoker   . Smokeless tobacco: Never Used  . Alcohol Use: 0.0 oz/week    0 Standard drinks or equivalent per week     Comment: 3 glasses wine per night   family history includes Birth defects in Candace Greene other; Breast cancer in Candace Greene maternal aunt and maternal grandmother; Heart attack in Candace Greene father; Hyperlipidemia in Candace Greene other; Stroke in Candace Greene father.  ROS as above Medications: Current Outpatient Prescriptions  Medication Sig Dispense Refill  . Acetaminophen-Caffeine 500-65 MG CAPS Take 1-2 tablets by mouth every 6 (six) hours as needed (headache). 30 capsule 1  . diclofenac sodium (VOLTAREN) 1 % GEL Apply 2 g topically 4 (four) times daily. 100 g 6  . ferrous sulfate 325 (65 FE) MG EC tablet Take 1 tablet (325 mg total) by mouth 3 (three) times daily with meals. 90 tablet 5  . fluticasone (FLONASE) 50 MCG/ACT nasal spray Place 2 sprays into both nostrils daily. 16 g 3  . medroxyPROGESTERone (PROVERA) 10 MG tablet Take 1 tablet (10 mg total) by mouth daily. 30 tablet 0  . Multiple  Vitamin (MULTIVITAMIN) tablet Take 1 tablet by mouth daily.    Marland Kitchen omeprazole (PRILOSEC) 40 MG capsule Take 1 capsule (40 mg total) by mouth daily. 30 capsule 3  . ranitidine (ZANTAC) 150 MG tablet Take 1 tablet (150 mg total) by mouth 2 (two) times daily. 60 tablet 1  . rivaroxaban (XARELTO) 20 MG TABS tablet TAKE ONE TABLET BY MOUTH ONCE DAILY WITH  SUPPER 30 tablet 1  . SUMAtriptan (IMITREX) 100 MG tablet Take 1 tablet (100 mg total) by mouth every 2 (two) hours as needed for migraine. May repeat in 2 hours if headache persists or recurs. 10 tablet 0   No current facility-administered medications for this visit.   No Known Allergies   Exam:  BP 125/82 mmHg  Pulse 65  Wt 199 lb (90.266 kg) Gen: Well NAD Nontoxic appearing HEENT: EOMI,  MMM Lungs: Normal work of breathing. CTABL Heart: RRR no MRG Abd: NABS, Soft. Nondistended, Nontender no masses palpated Exts: Brisk capillary refill, warm and well perfused.   No results found for this or any previous visit (from the past 24 hour(s)). No results found.   45 year old woman with iron deficiency anemia. Plan to obtain CBC and a polyp panel ferritin and total iron binding capacity as well as reticulocyte count. I anticipate a need for IV iron or blood transfusion.

## 2015-09-08 NOTE — Patient Instructions (Signed)
Thank you for coming in today. Get labs today.  Continue oral iron.  We may try to get you some blood or IV iron therapy.  Call or go to the emergency room if you get worse, have trouble breathing, have chest pains, or palpitations.   Iron Deficiency Anemia, Adult Anemia is a condition in which there are less red blood cells or hemoglobin in the blood than normal. Hemoglobin is the part of red blood cells that carries oxygen. Iron deficiency anemia is anemia caused by too little iron. It is the most common type of anemia. It may leave you tired and short of breath. CAUSES   Lack of iron in the diet.  Poor absorption of iron, as seen with intestinal disorders.  Intestinal bleeding.  Heavy periods. SIGNS AND SYMPTOMS  Mild anemia may not be noticeable. Symptoms may include:  Fatigue.  Headache.  Pale skin.  Weakness.  Tiredness.  Shortness of breath.  Dizziness.  Cold hands and feet.  Fast or irregular heartbeat. DIAGNOSIS  Diagnosis requires a thorough evaluation and physical exam by your health care provider. Blood tests are generally used to confirm iron deficiency anemia. Additional tests may be done to find the underlying cause of your anemia. These may include:  Testing for blood in the stool (fecal occult blood test).  A procedure to see inside the colon and rectum (colonoscopy).  A procedure to see inside the esophagus and stomach (endoscopy). TREATMENT  Iron deficiency anemia is treated by correcting the cause of the deficiency. Treatment may involve:  Adding iron-rich foods to your diet.  Taking iron supplements. Pregnant or breastfeeding women need to take extra iron because their normal diet usually does not provide the required amount.  Taking vitamins. Vitamin C improves the absorption of iron. Your health care provider may recommend that you take your iron tablets with a glass of orange juice or vitamin C supplement.  Medicines to make heavy  menstrual flow lighter.  Surgery. HOME CARE INSTRUCTIONS   Take iron as directed by your health care provider.  If you cannot tolerate taking iron supplements by mouth, talk to your health care provider about taking them through a vein (intravenously) or an injection into a muscle.  For the best iron absorption, iron supplements should be taken on an empty stomach. If you cannot tolerate them on an empty stomach, you may need to take them with food.  Do not drink milk or take antacids at the same time as your iron supplements. Milk and antacids may interfere with the absorption of iron.  Iron supplements can cause constipation. Make sure to include fiber in your diet to prevent constipation. A stool softener may also be recommended.  Take vitamins as directed by your health care provider.  Eat a diet rich in iron. Foods high in iron include liver, lean beef, whole-grain bread, eggs, dried fruit, and dark green leafy vegetables. SEEK IMMEDIATE MEDICAL CARE IF:   You faint. If this happens, do not drive. Call your local emergency services (911 in [place] if no other help is available.  You have chest pain.  You feel nauseous or vomit.  You have severe or increased shortness of breath with activity.  You feel weak.  You have a rapid heartbeat.  You have unexplained sweating.  You become light-headed when getting up from a chair or bed. MAKE SURE YOU:   Understand these instructions.  Will watch your condition.  Will get help right away if you are not  doing well or get worse.   This information is not intended to replace advice given to you by your health care provider. Make sure you discuss any questions you have with your health care provider.   Document Released: 04/08/2000 Document Revised: 05/02/2014 Document Reviewed: 12/17/2012 Elsevier Interactive Patient Education [employer].

## 2015-09-09 LAB — IRON AND TIBC
%SAT: 5 % — ABNORMAL LOW (ref 11–50)
Iron: 22 ug/dL — ABNORMAL LOW (ref 40–190)
TIBC: 425 ug/dL (ref 250–450)
UIBC: 403 ug/dL — ABNORMAL HIGH (ref 125–400)

## 2015-09-09 LAB — CBC WITH DIFFERENTIAL/PLATELET
Basophils Absolute: 0 {cells}/uL (ref 0–200)
Basophils Relative: 0 %
Eosinophils Absolute: 124 {cells}/uL (ref 15–500)
Eosinophils Relative: 2 %
HCT: 29.5 % — ABNORMAL LOW (ref 35.0–45.0)
Hemoglobin: 9.3 g/dL — ABNORMAL LOW (ref 11.7–15.5)
Lymphocytes Relative: 41 %
Lymphs Abs: 2542 {cells}/uL (ref 850–3900)
MCH: 28 pg (ref 27.0–33.0)
MCHC: 31.5 g/dL — ABNORMAL LOW (ref 32.0–36.0)
MCV: 88.9 fL (ref 80.0–100.0)
MPV: 10.4 fL (ref 7.5–12.5)
Monocytes Absolute: 434 {cells}/uL (ref 200–950)
Monocytes Relative: 7 %
Neutro Abs: 3100 {cells}/uL (ref 1500–7800)
Neutrophils Relative %: 50 %
Platelets: 405 10*3/uL — ABNORMAL HIGH (ref 140–400)
RBC: 3.32 MIL/uL — ABNORMAL LOW (ref 3.80–5.10)
RDW: 14.9 % (ref 11.0–15.0)
WBC: 6.2 10*3/uL (ref 3.8–10.8)

## 2015-09-09 LAB — FERRITIN: FERRITIN: 21 ng/mL (ref 10–232)

## 2015-09-09 LAB — RETICULOCYTES
ABS Retic: 83250 cells/uL — ABNORMAL HIGH (ref 20000–80000)
RBC.: 3.33 MIL/uL — AB (ref 3.80–5.10)
RETIC CT PCT: 2.5 %

## 2015-09-09 LAB — TSH: TSH: 1.11 m[IU]/L

## 2015-09-09 NOTE — Progress Notes (Signed)
Quick Note:  Labs shows significantly low iron stores. Continue oral iron. If menstrual bleeding continues and iron levels do not increase we will use IV iron. Try to take the iron pills 3 times a day if possible. Follow-up with your primary care doctor in one month. ______

## 2015-09-18 ENCOUNTER — Ambulatory Visit: Payer: BLUE CROSS/BLUE SHIELD | Admitting: Family Medicine

## 2015-10-01 ENCOUNTER — Ambulatory Visit (INDEPENDENT_AMBULATORY_CARE_PROVIDER_SITE_OTHER): Payer: BLUE CROSS/BLUE SHIELD | Admitting: Family Medicine

## 2015-10-01 ENCOUNTER — Ambulatory Visit (INDEPENDENT_AMBULATORY_CARE_PROVIDER_SITE_OTHER): Payer: BLUE CROSS/BLUE SHIELD

## 2015-10-01 ENCOUNTER — Encounter: Payer: Self-pay | Admitting: Family Medicine

## 2015-10-01 VITALS — BP 133/72 | HR 54 | Temp 97.7°F | Wt 192.0 lb

## 2015-10-01 DIAGNOSIS — R109 Unspecified abdominal pain: Secondary | ICD-10-CM

## 2015-10-01 DIAGNOSIS — R1011 Right upper quadrant pain: Secondary | ICD-10-CM | POA: Diagnosis not present

## 2015-10-01 DIAGNOSIS — R10A0 Flank pain, unspecified side: Secondary | ICD-10-CM

## 2015-10-01 DIAGNOSIS — M545 Low back pain, unspecified: Secondary | ICD-10-CM

## 2015-10-01 LAB — POCT URINALYSIS DIPSTICK
Bilirubin, UA: NEGATIVE
GLUCOSE UA: NEGATIVE
KETONES UA: NEGATIVE
Leukocytes, UA: NEGATIVE
Nitrite, UA: NEGATIVE
Protein, UA: NEGATIVE
SPEC GRAV UA: 1.025
UROBILINOGEN UA: 0.2
pH, UA: 6

## 2015-10-01 NOTE — Progress Notes (Signed)
Subjective:    CC: Right flank pain  HPI: Patient comes in today complaining of right flank pain. Enough that she went home from work early. She says the nurse at work checked her temperature and it was elevated and told her she needed to go home because of the fever. She does not think she's had a fever today. She has been more fatigued but she also started her period over the weekend. She felt very nauseated yesterday but none today. She denies any upper respiratory symptoms. She has felt lightheaded on and off. Her period actually isn't as heavy as it usually is. She denies any dysuria or hematuria. She has had some pressure over her lumbar spine radiating to both sides. No worsening or alleviating factors. No diarrhea or vomiting.    Past medical history, Surgical history, Family history not pertinant except as noted below, Social history, Allergies, and medications have been entered into the medical record, reviewed, and corrections made.   Review of Systems: + fevers.  No chills, night sweats, weight loss, chest pain, or shortness of breath.   Objective:    General: Well Developed, well nourished, and in no acute distress.  Neuro: Alert and oriented x3, extra-ocular muscles intact, sensation grossly intact.  HEENT: Normocephalic, atraumatic  Skin: Warm and dry, no rashes. Cardiac: Regular rate and rhythm, no murmurs rubs or gallops, no lower extremity edema.  Respiratory: Clear to auscultation bilaterally. Not using accessory muscles, speaking in full sentences. Abd: Normal bowel sounds. Tender in the epigastrium and especially in the right upper quadrant. Some mild general tenderness as well. No rebound or guarding but she is tender enough that it actually made her tearful. Musculoskeletal: She's also tender over the lumbars spine. Some mild tenderness over the paraspinous muscles bilaterally of the lower thoracic spine.   Impression and Recommendations:   Right flank pain and right  upper quadrant pain-we'll schedule for ultrasound for further evaluation for possible acute cholecystitis. She had some epigastric pain earlier this year about 4 months ago and had a CT at that time which was essentially negative.  Low back pain - may be related to her menses versus muscle skeletal injury. MR concerned about the flank pain today and need to rule out acute cholecystitis and/or acute pyelonephritis.

## 2015-10-02 LAB — URINE CULTURE
COLONY COUNT: NO GROWTH
ORGANISM ID, BACTERIA: NO GROWTH

## 2015-10-28 ENCOUNTER — Ambulatory Visit (INDEPENDENT_AMBULATORY_CARE_PROVIDER_SITE_OTHER): Payer: BLUE CROSS/BLUE SHIELD | Admitting: Osteopathic Medicine

## 2015-10-28 ENCOUNTER — Encounter: Payer: Self-pay | Admitting: Osteopathic Medicine

## 2015-10-28 VITALS — BP 119/64 | HR 85 | Ht 62.0 in | Wt 195.0 lb

## 2015-10-28 DIAGNOSIS — J0100 Acute maxillary sinusitis, unspecified: Secondary | ICD-10-CM

## 2015-10-28 DIAGNOSIS — J029 Acute pharyngitis, unspecified: Secondary | ICD-10-CM

## 2015-10-28 LAB — POCT RAPID STREP A (OFFICE): RAPID STREP A SCREEN: NEGATIVE

## 2015-10-28 MED ORDER — LIDOCAINE VISCOUS HCL 2 % MT SOLN: 15.0000 mL | OROMUCOSAL | Status: DC | PRN

## 2015-10-28 MED ORDER — IPRATROPIUM BROMIDE 0.03 % NA SOLN: 2.0000 | Freq: Three times a day (TID) | NASAL | Status: DC | PRN

## 2015-10-28 MED ORDER — AMOXICILLIN-POT CLAVULANATE 875-125 MG PO TABS: 1.0000 | ORAL_TABLET | Freq: Two times a day (BID) | ORAL | Status: DC

## 2015-10-28 NOTE — Patient Instructions (Signed)
DR. Cristianna Cyr'S HOME CARE INSTRUCTIONS: VIRAL ILLNESSES - ACHES/PAINS, SORE THROAT, SINUSITIS, COUGH, GASTRITIS   FRIST, A FEW NOTES ON OVER-THE-COUNTER MEDICATIONS!   USE CAUTION - MANY OVER-THE-COUNTER MEDICATIONS COME IN COMBINATIONS OF MULTIPLE GENERIC MEDICINES. FOR INSTANCE, NYQUIL HAS TYLENOL + COUGH MEDICINE + AN ANTIHISTAMINE, SO BE CAREFUL YOU'RE NOT TAKING A COMBINATION MEDICINE WHICH CONTAINS MEDICATIONS YOU'RE ALSO TAKING SEPARATELY (LIKE NYQUIL SYRUP PLUS TYLENOL PILLS).   YOUR PHARMACIST CAN HELP YOU AVOID MEDICATION INTERACTIONS AND DUPLICATIONS - ASK FOR THEIR HELP IF YOU ARE CONFUSED OR UNSURE ABOUT WHAT TO PURCHASE OVER-THE-COUNTER!   REMEMBER - IF YOU'RE EVER CONCERNED ABOUT MEDICATION SIDE EFFECTS, OR IF YOU'RE EVER CONCERNED YOUR SYMPTOMS ARE GETTING WORSE DESPITE TREATMENT, PLEASE CALL THE DOCTOR'S OFFICE! IF AFTER-HOURS, YOU CAN BE SEEN IN URGENT CARE OR, FOR SEVERE ILLNESS, PLEASE GO TO THE EMERGENCY ROOM.   TREAT SINUS CONGESTION, RUNNY NOSE & POSTNASAL DRIP:  Treat to increase airflow through sinuses, decrease congestion pain and prevent bacterial growth!   Remember, only 0.5-2% of sinus infections are due to a bacteria, the rest are due to a virus (usually the common cold) which will not get better with antibiotics!  NASAL SPRAYS: generally safe and should not interact with other medicines. Can take any of these medications, either alone or together... FLONASE (FLUTICASONE) - 2 sprays in each nostril twice per day (also a great allergy medicine to use long-term!) AFRIN (OXYMETOLAZONE) - use sparingly because it will cause rebound congestion, NEVER USE IN KIDS  SALINE NASAL SPRAY- no limit, but avoid use after other nasal sprays or it can wash the medicine away PRESCRTIPTION ATROVENT - as directed on prescription bottle  ANTIHISTAMINES: Helps dry out runny nose and decreases postnasal drip. Benadryl may cause drowsiness but other preparations should not be  as sedating. Certain kinds are not as safe in elderly individuals. OK to use unless Dr A says otherwise.  Only use one of the following... BENADRYL (DIPHENHYDRAMINE) - 25-50 mg every 6 hours ZYRTEC (CETIRIZINE) - 5-10 mg daily CLARITIN (LORATIDINE) - less potent. 10 mg daily ALLEGRA (FEXOFENADINE) - least likely to cause drowsiness! 180 mg daily or 60 mg twice per day  DECONGESTANTS: Helps dry out runny nose and helps with sinus pain. May cause insomnia, or sometimes elevated heart rate. Can cause problems if used often in people with high blood pressure. OK to use unless Dr A says otherwise. NEVER USE IN KIDS UNDER 2 YEARS OLD. Only use one of the following... SUDAFED (PSEUDOEPHEDINE) - 60 mg every 4 - 6 hours, also comes in 120 mg extended release every 12 hours, maximum 240 mg in 24 hours.  SUDAED PE (PHENYLEPHRINE) - 10 mg every 4 - 6 hours, maximum 60 mg per day  COMBINATIONS OF ABOVE ANTIHISTAMINES + DECONGESTANTS: these usually require you to show your ID at the pharmacy counter. You can also purchase these medicines separately as noted above.  Only use one of the following... ZYRTEC-D (CETIRIZINE + PSEUDOEPHEDRINE) - 12 hour formulation as directed CLARITIN-D (LORATIDINE + PSEUDOEPHEDRINE) - 12 and 24 hour formulations as directed ALLEGRA-D (FEXOFENADINE + PSEUDOEPHEDRINE) - 12 and 24 hour formulations as directed  PRESCRIPTION TREATMENT FOR SINUS PROBLEMS: Can include nasal sprays, pills, or antibiotics in the case of true bacterial infection. Not everyone needs an antibiotic but there are other medicines which will help you feel better while your body fights the infection!    TREAT COUGH & SORE THROAT:  Remember, cough is the body's way of protecting your   airways and lungs, it's a hard-wired reflex that is tough for medicines to treat!   Irritation to the airways will cause cough. This irritation is usually caused by upper airway problems like postnasal drip (treat as above)  and viral sore throat, but in severe cases can be due to lower airway problems like bronchitis or pneumonia, which a doctor can usually hear on exam of your lungs or see on an X-ray.  Sore throat is almost always due to a virus, but occasionally caused by certain strains of Strep, which requires antibiotics.   Exercise and smoking may make cough worse - take it easy while you're sick, and QUIT SMOKING!   Cough due to viral infection can linger for 2-3 weeks or so. If you're coughing longer than you think you should, or if the cough is severe, please make an appointment in the office - you may need a chest X-ray.  EXPECTORANT: Used to help clear airways, take these with PLENTY of water to help thin mucus secretions and make the mucus easier to cough up  Only use one of the following... ROBITUSSIN (DEXTROMETHORPHAN OR GUAIFENISEN depending on formulation)  MUCINEX (GUAIFENICEN) - usually longer acting  COUGH DROPS/LOZENGES: Whichever over-the-counter agent you prefer!  Here are some suggestions for ingredients to look for (can take both)... BENZOCAINE - numbing effect, also in CHLORASEPTIC THROAT SPRAY MENTHOL - cooling effect  HONEY: has gone head-to-head in several clinical trials with prescription cough medicines and found to be equally effective! Try 1 Teaspoon Honey + 2 Drops Lemon Juice, as much as you want to use. NONE FOR KIDS UNDER AGE 2  HERBAL TEA: There are certain ingredients which help "coat the throat" to relieve pain  such as ELM BARK, LICORICE ROOT, MARSHMALLOW ROOT  PRESCRIPTION TREATMENT FOR COUGH: Reserved for severe cases. Can include pills, syrups or inhalers.    TREAT ACHES & PAINS, FEVER:  Illness causes aches, pains and fever as your body increases its natural inflammation response to help fight the infection.   Rest, good hydration and nutrition, and taking anti-inflammatory medications will help.   Remember: a true fever is a temperature 100.4 or  higher. If you have a fever that is 105.0 or higher, that is a dangerous level and needs medical attention in the office or in the ER!  Can take both of these together if it's ok with your doctor... IBUPROFEN - 400-800 mg every 6 - 8 hours. Ibuprofen and similar medications can cause problems for people with heart disease or history of stomach ulcers, check with Dr A first if you're concerned. Lower doses are usually safe and effective.  TYLENOL (ACETAMINOPHEN) - 500-1000 mg every 6 hours. It won't cause problems with heart or stomach.     TREAT GASTROINTESTINAL SYMPTOMS:  Hydrate, hydrate, hydrate! Try drinking Gatorade/Powerade, broth/soup. Avoid milk and juice, these can make diarrhea worse. You can drink water, of course, but if you are also having vomiting and loose stool you are losing electrolytes which water alone can't replace.  IMMODIUM (LOPERAMIDE) - as directed on the bottle to help with loose stool PRESCRIPTION ZOFRAN (ONDANSETRON) OR PHENERGAN (PROMETHAZINE) - as directed to help nausea and vomiting. Try taking these before you eat if you are having trouble keeping food down.     REMEMBER - THE MOST IMPORTANT THINGS YOU CAN DO TO AVOID CATCHING OR SPREADING ILLNESS INCLUDE:   COVER YOUR COUGH WITH YOUR ARM, NOT WITH YOUR HANDS!   DISINFECT COMMONLY USED SURFACES (SUCH AS   TELEPHONES & DOORKNOBS) WHEN YOU OR SOMEONE CLOSE TO YOU IS FEELING SICK!   BE SURE VACCINES ARE UP TO DATE - GET A FLU SHOT EVERY YEAR! THE FLU CAN BE DEADLY FOR BABIES, ELDERLY FOLKS, AND PEOPLE WITH WEAK IMMUNE SYSTEMS - YOU SHOULD BE VACCINATED TO HELP PREVENT YOURSELF FROM GETTING SICK, BUT ALSO TO PREVENT SOMEONE ELSE FROM GETTING AN INFECTION WHICH MAY HOSPITALIZE OR KILL THEM.  GOOD NUTRITION AND HEALTHY LIFESTYLE WILL HELP YOUR IMMUNE SYSTEM YEAR-ROUND! THERE IS NO MAGIC SUPPLEMENT!  AND ABOVE ALL - WASH YOUR HANDS OFTEN!   

## 2015-10-28 NOTE — Progress Notes (Signed)
HPI: Candace Greene is a 45 y.o. female who presents to [facility]  today for chief complaint of:  Chief Complaint  Patient presents with  . Sore Throat   [place]/sinus complaints . Location: ears, sinus - both sides, sore throat  . Quality: soreness, pressure, runny nose  . Assoc signs/symptoms: see ROS . Duration: about last week . Modifying factors: has tried the following OTC/Rx medications: Mucinex, Tylenol sinus/allergy    Past medical, social and family history reviewed. Current medications and allergies reviewed.     Review of Systems: CONSTITUTIONAL: no fever/chills HEAD/EYES/EARS/NOSE/THROAT: yes headache, no vision change or hearing change, yes sore throat CARDIAC: No chest pain, No pressure/palpitations RESPIRATORY: no cough, no shortness of breath GASTROINTESTINAL: no nausea, no vomiting, no abdominal pain, yes diarrhea MUSCULOSKELETAL: no myalgia/arthralgia SKIN: no rash   Exam:  BP 119/64 mmHg  Pulse 85  Ht 5\' 2"  (1.575 m)  Wt 195 lb (88.451 kg)  BMI 35.66 kg/m2  LMP 09/26/2015 (Exact Date) Constitutional: VSS, see above. General Appearance: alert, well-developed, well-nourished, NAD Eyes: Normal lids and conjunctive, non-icteric sclera, PERRLA Ears, Nose, Mouth, Throat: Normal external inspection ears/nares/mouth/lips/gums, normal TM, MMM;       posterior pharynx with erythema, without exudate, nasal mucosa normal Neck: No masses, trachea midline. normal lymph nodes Respiratory: Normal respiratory effort. No  wheeze/rhonchi/rales Cardiovascular: S1/S2 normal, no murmur/rub/gallop auscultated. RRR.   Results for orders placed or performed in visit on 10/28/15 (from the past 72 hour(s))  POCT rapid strep A     Status: None   Collection Time: 10/28/15  3:54 PM  Result Value Ref Range   Rapid Strep A Screen Negative Negative   No results found.   ASSESSMENT/PLAN: Most likely viral, possible allergy. Advised fill  antibiotics if no improvement 7-10 days after symptom onset.  Sore throat - Plan: POCT rapid strep A, Lidocaine HCl 2 % SOLN  Acute maxillary sinusitis, recurrence not specified - Plan: ipratropium (ATROVENT) 0.03 % nasal spray, amoxicillin-clavulanate (AUGMENTIN) 875-125 MG tablet    Visit summary was printed for the patient with medications and pertinent instructions for patient to review. ER/RTC precautions reviewed. All questions answered. Return if symptoms worsen or fail to improve.

## 2015-11-03 ENCOUNTER — Encounter: Payer: Self-pay | Admitting: Physician Assistant

## 2015-11-03 ENCOUNTER — Ambulatory Visit (INDEPENDENT_AMBULATORY_CARE_PROVIDER_SITE_OTHER): Payer: BLUE CROSS/BLUE SHIELD | Admitting: Physician Assistant

## 2015-11-03 ENCOUNTER — Ambulatory Visit (HOSPITAL_BASED_OUTPATIENT_CLINIC_OR_DEPARTMENT_OTHER)
Admission: RE | Admit: 2015-11-03 | Discharge: 2015-11-03 | Disposition: A | Discharge status: Home / Self Care | Payer: BLUE CROSS/BLUE SHIELD | Source: Ambulatory Visit | Attending: Physician Assistant | Admitting: Physician Assistant

## 2015-11-03 VITALS — BP 122/55 | HR 73 | Temp 97.9°F | Wt 193.0 lb

## 2015-11-03 DIAGNOSIS — R1115 Cyclical vomiting syndrome unrelated to migraine: Secondary | ICD-10-CM

## 2015-11-03 DIAGNOSIS — R1011 Right upper quadrant pain: Secondary | ICD-10-CM | POA: Diagnosis present

## 2015-11-03 DIAGNOSIS — G43A0 Cyclical vomiting, not intractable: Secondary | ICD-10-CM | POA: Insufficient documentation

## 2015-11-03 LAB — POCT URINALYSIS DIPSTICK
Bilirubin, UA: NEGATIVE
Glucose, UA: NEGATIVE
KETONES UA: NEGATIVE
Leukocytes, UA: NEGATIVE
Nitrite, UA: NEGATIVE
PROTEIN UA: NEGATIVE
SPEC GRAV UA: 1.025
Urobilinogen, UA: 0.2
pH, UA: 7

## 2015-11-03 MED ORDER — ONDANSETRON HCL 4 MG PO TABS: 4.0000 mg | ORAL_TABLET | Freq: Three times a day (TID) | ORAL | Status: DC | PRN

## 2015-11-03 NOTE — Patient Instructions (Signed)

## 2015-11-03 NOTE — Progress Notes (Signed)
   Subjective:    Patient ID: Candace Greene, female    DOB: 09-20-1970, 45 y.o.   MRN: [id number]  HPI Candace Greene is a 45 year old female that presents with right sided abdominal pain for the last 3 days. She states the pain comes and goes, is sharp, and lasts for 15 minutes when she "has an episode.' she states the pain radiates to the epigastric area and to the right flank and back. She reports waking up from sleep yesterday night. She reports nausea and one episode of vomiting earlier today. She denies fever, chills, SOB, changes in bowel movements, dysuria, and previous abdominal surgery. She has not tried anything to make better. No association with food making better or worse but "coke" makes better. No acid reflux symptoms.    Review of Systems  Constitutional: Negative.   HENT: Negative.   Eyes: Negative.   Respiratory: Negative.   Cardiovascular: Negative.   Gastrointestinal: Positive for nausea and abdominal pain.       Reports right sided abdominal pain and nausea   Endocrine: Negative.   Genitourinary: Negative.   Musculoskeletal: Negative.        Objective:   Physical Exam  Constitutional: She appears well-developed and well-nourished.  Cardiovascular: Normal rate, regular rhythm and normal heart sounds.   Pulmonary/Chest: Effort normal and breath sounds normal.  Abdominal: Soft. Bowel sounds are normal. There is tenderness.  MildTenderness in the suprapubic and right sided abdomen.  Mild guarding in RUQ. No rebound tenderness. Positive Murphy's sign. Positive right sided CVA.  Skin: Skin is warm and dry.       Assessment & Plan:  RUQ pain/nausea- concerned For gallbladder etiology. Patient had just eaten. After she has been nothing by mouth for 6 hours will get an ultrasound. We scheduled at the [facility] for 6:30 tonight. CMP, CBC, lipase ordered in office today to have drawn. We'll send over Zofran for nausea.encouraged BRAT diet.   .. Results for  orders placed or performed in visit on 11/03/15  Urinalysis Dipstick  Result Value Ref Range   Color, UA yellow    Clarity, UA clear    Glucose, UA negative    Bilirubin, UA negative    Ketones, UA negative    Spec Grav, UA 1.025    Blood, UA trace-lysed    pH, UA 7.0    Protein, UA negative    Urobilinogen, UA 0.2    Nitrite, UA negative    Leukocytes, UA Negative Negative   Will culture no overt signs of infection in urine dipstick.

## 2015-11-04 ENCOUNTER — Encounter: Payer: Self-pay | Admitting: Physician Assistant

## 2015-11-04 DIAGNOSIS — K760 Fatty (change of) liver, not elsewhere classified: Secondary | ICD-10-CM | POA: Insufficient documentation

## 2015-11-04 LAB — COMPLETE METABOLIC PANEL WITH GFR
ALBUMIN: 3.8 g/dL (ref 3.6–5.1)
ALK PHOS: 44 U/L (ref 33–115)
ALT: 8 U/L (ref 6–29)
AST: 15 U/L (ref 10–30)
BILIRUBIN TOTAL: 0.5 mg/dL (ref 0.2–1.2)
BUN: 8 mg/dL (ref 7–25)
CO2: 22 mmol/L (ref 20–31)
CREATININE: 0.9 mg/dL (ref 0.50–1.10)
Calcium: 8.7 mg/dL (ref 8.6–10.2)
Chloride: 106 mmol/L (ref 98–110)
GFR, EST NON AFRICAN AMERICAN: 78 mL/min (ref >=60)
GFR, Est African American: >89 mL/min (ref >=60)
GLUCOSE: 107 mg/dL — AB (ref 65–99)
Potassium: 3.7 mmol/L (ref 3.5–5.3)
SODIUM: 138 mmol/L (ref 135–146)
TOTAL PROTEIN: 6.7 g/dL (ref 6.1–8.1)

## 2015-11-04 LAB — CBC WITH DIFFERENTIAL/PLATELET
BASOS ABS: 0 {cells}/uL (ref 0–200)
BASOS PCT: 0 %
EOS ABS: 47 {cells}/uL (ref 15–500)
EOS PCT: 1 %
HCT: 37.1 % (ref 35.0–45.0)
HEMOGLOBIN: 12.2 g/dL (ref 11.7–15.5)
LYMPHS ABS: 1504 {cells}/uL (ref 850–3900)
Lymphocytes Relative: 32 %
MCH: 29.6 pg (ref 27.0–33.0)
MCHC: 32.9 g/dL (ref 32.0–36.0)
MCV: 90 fL (ref 80.0–100.0)
MPV: 11.2 fL (ref 7.5–12.5)
Monocytes Absolute: 423 cells/uL (ref 200–950)
Monocytes Relative: 9 %
NEUTROS ABS: 2726 {cells}/uL (ref 1500–7800)
Neutrophils Relative %: 58 %
Platelets: 288 10*3/uL (ref 140–400)
RBC: 4.12 MIL/uL (ref 3.80–5.10)
RDW: 20.6 % — ABNORMAL HIGH (ref 11.0–15.0)
WBC: 4.7 10*3/uL (ref 3.8–10.8)

## 2015-11-04 LAB — LIPASE: Lipase: 95 U/L — ABNORMAL HIGH (ref 7–60)

## 2015-11-05 ENCOUNTER — Telehealth: Payer: Self-pay | Admitting: Physician Assistant

## 2015-11-05 DIAGNOSIS — R1084 Generalized abdominal pain: Secondary | ICD-10-CM

## 2015-11-05 LAB — URINE CULTURE

## 2015-11-05 NOTE — Telephone Encounter (Signed)
Pt called requesting work excuse from 11/02/15 and return 11/09/15. Will route.

## 2015-11-06 ENCOUNTER — Encounter: Payer: Self-pay | Admitting: Family Medicine

## 2015-11-06 NOTE — Telephone Encounter (Signed)
Letter and forms completed and faxed to [phone]. Original form sent to be scanned.

## 2015-11-06 NOTE — Telephone Encounter (Signed)
Left VM to see if she has GI preference. Also advised letter was ready but the forms she dropped off in office today have not been addressed yet.

## 2015-11-06 NOTE — Telephone Encounter (Signed)
Letter printed.

## 2015-11-06 NOTE — Telephone Encounter (Signed)
Did she end of going to ED on 7/12 as per phone note? If not then she needs GI referral.  Ok for work note to return on Monday but needs to see GI

## 2015-11-13 ENCOUNTER — Inpatient Hospital Stay: Payer: BLUE CROSS/BLUE SHIELD | Admitting: Family Medicine

## 2015-12-02 ENCOUNTER — Ambulatory Visit (INDEPENDENT_AMBULATORY_CARE_PROVIDER_SITE_OTHER): Payer: BLUE CROSS/BLUE SHIELD | Admitting: Physician Assistant

## 2015-12-02 ENCOUNTER — Encounter: Payer: Self-pay | Admitting: Physician Assistant

## 2015-12-02 VITALS — BP 134/93 | HR 111 | Ht 62.0 in | Wt 192.0 lb

## 2015-12-02 DIAGNOSIS — N390 Urinary tract infection, site not specified: Secondary | ICD-10-CM | POA: Diagnosis not present

## 2015-12-02 DIAGNOSIS — R319 Hematuria, unspecified: Secondary | ICD-10-CM

## 2015-12-02 LAB — POCT URINALYSIS DIPSTICK
Bilirubin, UA: NEGATIVE
GLUCOSE UA: NEGATIVE
Ketones, UA: NEGATIVE
NITRITE UA: NEGATIVE
Protein, UA: NEGATIVE
Spec Grav, UA: 1.025
UROBILINOGEN UA: 1
pH, UA: 6

## 2015-12-02 MED ORDER — OMEPRAZOLE 40 MG PO CPDR: 40.0000 mg | DELAYED_RELEASE_CAPSULE | Freq: Every day | ORAL | 3 refills | Status: DC

## 2015-12-02 NOTE — Progress Notes (Signed)
   Subjective:    Patient ID: Candace Greene, female    DOB: 09/08/1970, 45 y.o.   MRN: [id number]  HPI Pt is a 45 yo female who presents to the clinic for hospital follow up for UTI. She went to ED on 11/06/14 after a few days of right upper quadrant pain. She had previous testing here and u/s normal, blood work normal. She went to ED and found UTI. She is feeling much better and not symptomatic. No fever, chills, headaches, nausea.    Review of Systems  All other systems reviewed and are negative.      Objective:   Physical Exam  Constitutional: She is oriented to person, place, and time. She appears well-developed and well-nourished.  HENT:  Head: Normocephalic and atraumatic.  Cardiovascular: Normal rate, regular rhythm and normal heart sounds.   Pulmonary/Chest: Effort normal and breath sounds normal.  Negative for CVA tenderness.   Abdominal: Soft. Bowel sounds are normal. She exhibits no distension and no mass. There is no tenderness. There is no rebound and no guarding.  Neurological: She is alert and oriented to person, place, and time.  Psychiatric: She has a normal mood and affect. Her behavior is normal.          Assessment & Plan:  UTI- .Marland Kitchen Results for orders placed or performed in visit on 12/02/15  POCT urinalysis dipstick  Result Value Ref Range   Color, UA yellow    Clarity, UA cloudy    Glucose, UA neg    Bilirubin, UA neg    Ketones, UA neg    Spec Grav, UA 1.025    Blood, UA small    pH, UA 6.0    Protein, UA neg    Urobilinogen, UA 1.0    Nitrite, UA neg    Leukocytes, UA Trace (A) Negative   Will culture to make sure infection has cleared. Pt is asymptomatic today.

## 2015-12-03 ENCOUNTER — Encounter: Payer: Self-pay | Admitting: Physician Assistant

## 2015-12-04 LAB — URINE CULTURE

## 2015-12-08 MED ORDER — NITROFURANTOIN MONOHYD MACRO 100 MG PO CAPS
100.0000 mg | ORAL_CAPSULE | Freq: Two times a day (BID) | ORAL | 0 refills | Status: DC
Start: 2015-12-08 — End: 2015-12-09

## 2015-12-08 NOTE — Addendum Note (Signed)
Addended by: Donella Stade on: 12/08/2015 07:46 AM   Modules accepted: Orders

## 2015-12-09 ENCOUNTER — Other Ambulatory Visit: Payer: Self-pay

## 2015-12-09 MED ORDER — NITROFURANTOIN MONOHYD MACRO 100 MG PO CAPS: 100.0000 mg | ORAL_CAPSULE | Freq: Two times a day (BID) | ORAL | 0 refills | Status: DC

## 2015-12-29 ENCOUNTER — Encounter: Payer: Self-pay | Admitting: Family Medicine

## 2015-12-29 ENCOUNTER — Ambulatory Visit (INDEPENDENT_AMBULATORY_CARE_PROVIDER_SITE_OTHER): Payer: BLUE CROSS/BLUE SHIELD | Admitting: Family Medicine

## 2015-12-29 VITALS — BP 122/52 | HR 59 | Temp 98.1°F | Resp 18 | Wt 190.1 lb

## 2015-12-29 DIAGNOSIS — N390 Urinary tract infection, site not specified: Secondary | ICD-10-CM

## 2015-12-29 DIAGNOSIS — R109 Unspecified abdominal pain: Secondary | ICD-10-CM | POA: Diagnosis not present

## 2015-12-29 LAB — POCT URINALYSIS DIPSTICK
Bilirubin, UA: NEGATIVE
GLUCOSE UA: NEGATIVE
Leukocytes, UA: NEGATIVE
NITRITE UA: NEGATIVE
Protein, UA: 30
SPEC GRAV UA: 1.025
UROBILINOGEN UA: 0.2
pH, UA: 6

## 2015-12-29 MED ORDER — CIPROFLOXACIN HCL 500 MG PO TABS: 500.0000 mg | ORAL_TABLET | Freq: Two times a day (BID) | ORAL | 0 refills | Status: DC

## 2015-12-29 NOTE — Progress Notes (Signed)
Candace Greene is a 45 y.o. female who presents to [facility]: [facility] today for right flank pain. Patient has a several day history of right flank and abdominal pain. The pain does not radiate. No fevers or chills. Patient does note some mild vomiting and nausea. She denies significant diarrhea. She recently was treated for pyelonephritis and a recurrent urinary tract infection. The most recent urine culture for about a month ago shows Escherichia coli resistant to first remission cephalosporins and amoxicillin. She denies any blood in her urine.   Past Medical History:  Diagnosis Date  . Heel spur    right  . Pulmonary embolus [facility])    Past Surgical History:  Procedure Laterality Date  . No prior surgery     Social History  Substance Use Topics  . Smoking status: Never Smoker  . Smokeless tobacco: Never Used  . Alcohol use 0.0 oz/week     Comment: 3 glasses wine per night   family history includes Birth defects in her other; Breast cancer in her maternal aunt and maternal grandmother; Heart attack in her father; Hyperlipidemia in her other; Stroke in her father.  ROS as above:  Medications: Current Outpatient Prescriptions  Medication Sig Dispense Refill  . Acetaminophen-Caffeine 500-65 MG CAPS Take 1-2 tablets by mouth every 6 (six) hours as needed (headache). 30 capsule 1  . ciprofloxacin (CIPRO) 500 MG tablet Take 1 tablet (500 mg total) by mouth 2 (two) times daily. 20 tablet 0  . diclofenac sodium (VOLTAREN) 1 % GEL Apply 2 g topically 4 (four) times daily. 100 g 6  . ferrous sulfate 325 (65 FE) MG EC tablet Take 1 tablet (325 mg total) by mouth 3 (three) times daily with meals. 90 tablet 5  . fluticasone (FLONASE) 50 MCG/ACT nasal spray Place 2 sprays into both nostrils daily. 16 g 3  . ipratropium (ATROVENT) 0.03 % nasal spray Place 2 sprays into both  nostrils 3 (three) times daily as needed for rhinitis. 30 mL 0  . Lidocaine HCl 2 % SOLN Use as directed 15 mLs in the mouth or throat every 3 (three) hours as needed (mouth/throat pain - gargle and spit). 100 mL 0  . Multiple Vitamin (MULTIVITAMIN) tablet Take 1 tablet by mouth daily.    . nitrofurantoin, macrocrystal-monohydrate, (MACROBID) 100 MG capsule Take 1 capsule (100 mg total) by mouth 2 (two) times daily. 14 capsule 0  . omeprazole (PRILOSEC) 40 MG capsule Take 1 capsule (40 mg total) by mouth daily. 30 capsule 3  . ondansetron (ZOFRAN) 4 MG tablet Take 1 tablet (4 mg total) by mouth every 8 (eight) hours as needed for nausea or vomiting. 20 tablet 0  . ranitidine (ZANTAC) 150 MG tablet Take 1 tablet (150 mg total) by mouth 2 (two) times daily. 60 tablet 1  . SUMAtriptan (IMITREX) 100 MG tablet Take 1 tablet (100 mg total) by mouth every 2 (two) hours as needed for migraine. May repeat in 2 hours if headache persists or recurs. 10 tablet 0   No current facility-administered medications for this visit.    No Known Allergies   Exam:  BP (!) 122/52 (BP Location: Right Arm, Patient Position: Sitting, Cuff Size: Normal)   Pulse (!) 59   Temp 98.1 F (36.7 C) (Oral)   Resp 18   Wt 190 lb 1.9 oz (86.2 kg)   SpO2 100%   BMI 34.77 kg/m  Gen: Well NAD Nontoxic  appearing HEENT: EOMI,  MMM Lungs: Normal work of breathing. CTABL Heart: RRR no MRG Abd: NABS, Soft. Nondistended, Nontender to palpation right CVA angle. Mildly tender palpation right upper quadrant. No rebound or guarding. Exts: Brisk capillary refill, warm and well perfused.   Results for orders placed or performed in visit on 12/29/15 (from the past 24 hour(s))  POCT urinalysis dipstick     Status: None   Collection Time: 12/29/15  3:22 PM  Result Value Ref Range   Color, UA yellow    Clarity, UA clear    Glucose, UA neg    Bilirubin, UA neg    Ketones, UA trace    Spec Grav, UA 1.025    Blood, UA small    pH, UA  6.0    Protein, UA 30    Urobilinogen, UA 0.2    Nitrite, UA neg    Leukocytes, UA Negative Negative   No results found.    Assessment and Plan: 45 y.o. female with Abdominal and flank pain. Concern for UTI versus early pyelonephritis. CBC CMP and lipase pending. Urine culture also pending. Empiric treatment with Cipro as this was an antibiotic the last urinary tract infection was sensitive to. Prompt follow-up if worsening or not improved.   Orders Placed This Encounter  Procedures  . Urine Culture  . CBC  . Comprehensive metabolic panel    Order Specific Question:   Has the patient fasted?    Answer:   No  . Lipase  . POCT urinalysis dipstick    Discussed warning signs or symptoms. Please see discharge instructions. Patient expresses understanding.

## 2015-12-29 NOTE — Patient Instructions (Signed)
Thank you for coming in today. Return if not better.  Take cipro twice daily.  If your belly pain worsens, or you have high fever, bad vomiting, blood in your stool or black tarry stool go to the Emergency Room.    Abdominal Pain, Adult Many things can cause abdominal pain. Usually, abdominal pain is not caused by a disease and will improve without treatment. It can often be observed and treated at home. Your health care provider will do a physical exam and possibly order blood tests and X-rays to help determine the seriousness of your pain. However, in many cases, more time must pass before a clear cause of the pain can be found. Before that point, your health care provider may not know if you need more testing or further treatment. HOME CARE INSTRUCTIONS Monitor your abdominal pain for any changes. The following actions may help to alleviate any discomfort you are experiencing:  Only take over-the-counter or prescription medicines as directed by your health care provider.  Do not take laxatives unless directed to do so by your health care provider.  Try a clear liquid diet (broth, tea, or water) as directed by your health care provider. Slowly move to a bland diet as tolerated. SEEK MEDICAL CARE IF:  You have unexplained abdominal pain.  You have abdominal pain associated with nausea or diarrhea.  You have pain when you urinate or have a bowel movement.  You experience abdominal pain that wakes you in the night.  You have abdominal pain that is worsened or improved by eating food.  You have abdominal pain that is worsened with eating fatty foods.  You have a fever. SEEK IMMEDIATE MEDICAL CARE IF:  Your pain does not go away within 2 hours.  You keep throwing up (vomiting).  Your pain is felt only in portions of the abdomen, such as the right side or the left lower portion of the abdomen.  You pass bloody or black tarry stools. MAKE SURE YOU:  Understand these  instructions.  Will watch your condition.  Will get help right away if you are not doing well or get worse.   This information is not intended to replace advice given to you by your health care provider. Make sure you discuss any questions you have with your health care provider.   Document Released: 01/19/2005 Document Revised: 12/31/2014 Document Reviewed: 12/19/2012 Elsevier Interactive Patient Education [employer].

## 2015-12-29 NOTE — Progress Notes (Signed)
Pt having abdominal pain since Monday morning.  Keeping her up at night.  Occasional vomiting, but clear when she vomits.

## 2015-12-30 LAB — CBC
HCT: 35.9 % (ref 35.0–45.0)
Hemoglobin: 12 g/dL (ref 11.7–15.5)
MCH: 30.8 pg (ref 27.0–33.0)
MCHC: 33.4 g/dL (ref 32.0–36.0)
MCV: 92.3 fL (ref 80.0–100.0)
MPV: 11.9 fL (ref 7.5–12.5)
PLATELETS: 292 10*3/uL (ref 140–400)
RBC: 3.89 MIL/uL (ref 3.80–5.10)
RDW: 16.3 % — AB (ref 11.0–15.0)
WBC: 5.3 10*3/uL (ref 3.8–10.8)

## 2015-12-30 LAB — COMPREHENSIVE METABOLIC PANEL
ALK PHOS: 44 U/L (ref 33–115)
ALT: 8 U/L (ref 6–29)
AST: 15 U/L (ref 10–30)
Albumin: 3.7 g/dL (ref 3.6–5.1)
BUN: 11 mg/dL (ref 7–25)
CHLORIDE: 108 mmol/L (ref 98–110)
CO2: 21 mmol/L (ref 20–31)
CREATININE: 1.19 mg/dL — AB (ref 0.50–1.10)
Calcium: 8.7 mg/dL (ref 8.6–10.2)
GLUCOSE: 79 mg/dL (ref 65–99)
Potassium: 4 mmol/L (ref 3.5–5.3)
SODIUM: 137 mmol/L (ref 135–146)
TOTAL PROTEIN: 6.7 g/dL (ref 6.1–8.1)
Total Bilirubin: 0.5 mg/dL (ref 0.2–1.2)

## 2015-12-30 LAB — LIPASE: LIPASE: 107 U/L — AB (ref 7–60)

## 2015-12-30 LAB — URINE CULTURE: ORGANISM ID, BACTERIA: NO GROWTH

## 2015-12-31 ENCOUNTER — Ambulatory Visit: Payer: BLUE CROSS/BLUE SHIELD | Admitting: Family Medicine

## 2016-01-27 ENCOUNTER — Ambulatory Visit: Payer: BLUE CROSS/BLUE SHIELD | Admitting: Family Medicine

## 2016-03-06 ENCOUNTER — Emergency Department (INDEPENDENT_AMBULATORY_CARE_PROVIDER_SITE_OTHER)
Admission: EM | Admit: 2016-03-06 | Discharge: 2016-03-06 | Disposition: A | Discharge status: Home / Self Care | Payer: BLUE CROSS/BLUE SHIELD | Source: Home / Self Care | Attending: Family Medicine | Admitting: Family Medicine

## 2016-03-06 ENCOUNTER — Emergency Department (INDEPENDENT_AMBULATORY_CARE_PROVIDER_SITE_OTHER): Payer: BLUE CROSS/BLUE SHIELD

## 2016-03-06 ENCOUNTER — Encounter: Payer: Self-pay | Admitting: Emergency Medicine

## 2016-03-06 DIAGNOSIS — M5416 Radiculopathy, lumbar region: Secondary | ICD-10-CM

## 2016-03-06 DIAGNOSIS — S46211A Strain of muscle, fascia and tendon of other parts of biceps, right arm, initial encounter: Secondary | ICD-10-CM | POA: Diagnosis not present

## 2016-03-06 DIAGNOSIS — M25521 Pain in right elbow: Secondary | ICD-10-CM

## 2016-03-06 DIAGNOSIS — S63501A Unspecified sprain of right wrist, initial encounter: Secondary | ICD-10-CM | POA: Diagnosis not present

## 2016-03-06 DIAGNOSIS — M25531 Pain in right wrist: Secondary | ICD-10-CM

## 2016-03-06 DIAGNOSIS — S5001XA Contusion of right elbow, initial encounter: Secondary | ICD-10-CM

## 2016-03-06 DIAGNOSIS — S39012A Strain of muscle, fascia and tendon of lower back, initial encounter: Secondary | ICD-10-CM

## 2016-03-06 LAB — POCT URINE PREGNANCY: Preg Test, Ur: NEGATIVE

## 2016-03-06 MED ORDER — PREDNISONE 20 MG PO TABS: ORAL_TABLET | ORAL | 0 refills | Status: DC

## 2016-03-06 MED ORDER — HYDROCODONE-ACETAMINOPHEN 5-325 MG PO TABS: ORAL_TABLET | ORAL | 0 refills | Status: DC

## 2016-03-06 MED ORDER — CYCLOBENZAPRINE HCL 10 MG PO TABS: ORAL_TABLET | ORAL | 0 refills | Status: DC

## 2016-03-06 NOTE — ED Provider Notes (Signed)
[name]    CSN: [id number] Arrival date & time: 03/06/16  1223     History   Chief Complaint Chief Complaint  Patient presents with  . Fall    HPI Candace Greene is a 45 y.o. female.   Yesterday patient was climbing down "pull-down" attic stairs when she fell from about four treads from the floor.  She landed on her right hip and shoulder.   She complains of pain in her lower back, radiating into her right hip and lateral thigh, pain in her right elbow, and pain in her right wrist. Patient had a DVT in December 2016, and is concerned about recurrence.  However, she denies pain or swelling in her legs.   The history is provided by the patient.  Back Pain  Location:  Lumbar spine Quality:  Aching Radiates to:  R thigh Pain severity:  Mild Pain is:  Same all the time Onset quality:  Sudden Duration:  1 day Timing:  Constant Progression:  Unchanged Chronicity:  New Context: falling   Relieved by:  None tried Worsened by:  Ambulation Ineffective treatments:  None tried Associated symptoms: no abdominal pain, no bladder incontinence, no bowel incontinence, no chest pain, no dysuria, no fever, no headaches, no leg pain, no numbness, no paresthesias, no pelvic pain, no perianal numbness, no tingling, no weakness and no weight loss   Arm Injury  Location:  Elbow and wrist Elbow location:  R elbow Wrist location:  R wrist Injury: yes   Time since incident:  1 day Mechanism of injury: fall   Fall:    Fall occurred:  Down stairs   Height of fall:  3 feet   Impact surface:  Hard floor   Point of impact:  Back and outstretched arms   Entrapped after fall: no   Pain details:    Quality:  Aching   Radiates to:  R elbow and R wrist   Severity:  Mild   Onset quality:  Sudden   Duration:  1 day   Timing:  Constant   Progression:  Unchanged Handedness:  Right-handed Dislocation: no   Prior injury to area:  No Relieved by:  None tried Worsened by:   Movement Ineffective treatments:  None tried Associated symptoms: back pain, decreased range of motion and stiffness   Associated symptoms: no fatigue, no fever, no muscle weakness, no neck pain, no numbness, no swelling and no tingling     Past Medical History:  Diagnosis Date  . Heel spur    right  . Pulmonary embolus [facility])     Patient Active Problem List   Diagnosis Date Noted  . Fatty liver 11/04/2015  . Pica 09/01/2015  . Fibroid, uterine 09/01/2015  . DUB (dysfunctional uterine bleeding) 09/01/2015  . Iron deficiency anemia 09/01/2015  . Menorrhagia with irregular cycle 09/01/2015  . Ovarian cyst 06/16/2015  . Uterine fibroid 06/16/2015  . Elevated lipase 06/15/2015  . Generalized abdominal pain 06/15/2015  . Atypical chest pain 06/10/2015  . Abnormal echocardiogram 05/06/2015  . Acute pulmonary embolism ([place]) 04/17/2015  . Pulmonary embolism ([place]) 04/17/2015  . Irregular bleeding 11/28/2014  . Wrist tendonitis 11/28/2014    Past Surgical History:  Procedure Laterality Date  . No prior surgery      OB History    No data available       Home Medications    Prior to Admission medications   Medication Sig Start Date End Date Taking? Authorizing Provider  cyclobenzaprine (  FLEXERIL) 10 MG tablet Take one tab PO HS prn muscle spasm 03/06/16   Kandra Nicolas, MD  HYDROcodone-acetaminophen (NORCO/VICODIN) 5-325 MG tablet Take one by mouth at bedtime as needed for pain 03/06/16   Kandra Nicolas, MD  predniSONE (DELTASONE) 20 MG tablet Take one tab by mouth twice daily for 5 days, then one daily. Take with food. 03/06/16   Kandra Nicolas, MD    Family History Family History  Problem Relation Age of Onset  . Stroke Father     <60  . Heart attack Father   . Birth defects Other   . Hyperlipidemia Other   . Breast cancer Maternal Grandmother   . Breast cancer Maternal Aunt   . Cancer      GF    Social History Social History  Substance Use Topics  .  Smoking status: Never Smoker  . Smokeless tobacco: Never Used  . Alcohol use 0.0 oz/week     Comment: 3 glasses wine per night     Allergies   Patient has no known allergies.   Review of Systems Review of Systems  Constitutional: Negative for fatigue, fever and weight loss.  Respiratory: Negative for cough, chest tightness, shortness of breath, wheezing and stridor.   Cardiovascular: Negative for chest pain.  Gastrointestinal: Negative for abdominal pain and bowel incontinence.  Genitourinary: Negative for bladder incontinence, dysuria and pelvic pain.  Musculoskeletal: Positive for back pain and stiffness. Negative for neck pain.  Neurological: Negative for tingling, weakness, numbness, headaches and paresthesias.  All other systems reviewed and are negative.    Physical Exam Triage Vital Signs ED Triage Vitals  Enc Vitals Group     BP 03/06/16 1255 133/79     Pulse Rate 03/06/16 1255 (!) 51     Resp --      Temp 03/06/16 1255 98.1 F (36.7 C)     Temp Source 03/06/16 1255 Oral     SpO2 03/06/16 1255 100 %     Weight 03/06/16 1255 187 lb 8 oz (85 kg)     Height 03/06/16 1255 5\' 2"  (1.575 m)     Head Circumference --      Peak Flow --      Pain Score 03/06/16 1257 9     Pain Loc --      Pain Edu? --      Excl. in [place]? --    No data found.   Updated Vital Signs BP 133/79 (BP Location: Left Arm)   Pulse (!) 51   Temp 98.1 F (36.7 C) (Oral)   Ht 5\' 2"  (1.575 m)   Wt 187 lb 8 oz (85 kg)   LMP 02/26/2016   SpO2 100%   BMI 34.29 kg/m   Visual Acuity Right Eye Distance:   Left Eye Distance:   Bilateral Distance:    Right Eye Near:   Left Eye Near:    Bilateral Near:     Physical Exam  Constitutional: She is oriented to person, place, and time. She appears well-developed and well-nourished. No distress.  HENT:  Head: Atraumatic.  Right Ear: External ear normal.  Left Ear: External ear normal.  Nose: Nose normal.  Mouth/Throat: Oropharynx is clear and  moist.  Eyes: Conjunctivae are normal. Pupils are equal, round, and reactive to light.  Neck: Normal range of motion.  Cardiovascular: Normal heart sounds.   Pulmonary/Chest: Breath sounds normal.  Abdominal: Bowel sounds are normal.  Musculoskeletal: She exhibits no edema.  Right elbow: She exhibits normal range of motion and no swelling. Tenderness found. Radial head tenderness noted.       Right wrist: She exhibits tenderness and bony tenderness. She exhibits normal range of motion, no swelling and no effusion.       Lumbar back: She exhibits decreased range of motion, tenderness and bony tenderness.       Back:       Arms: Back:  Can heel/toe walk and squat without difficulty.  Decreased forward flexion.  Tenderness in the midline and right paraspinous muscles from mid-thoracic to Sacral area.  Straight leg raising test is positive on the right at 30 degrees.  Sitting knee extension test is negative.  Strength and sensation in the lower extremities is normal.  Patellar and achilles reflexes are normal.   No lower leg edema, calf or thigh tenderness/swelling/warmth.  Right wrist has tenderness to palpation over snuffbox area.  Right elbow has tenderness to palpation over the radial head.  Right shoulder has full range of motion.  There is distinct tenderness to palpation over the insertion of long head of biceps tendon.  Apley's test negative.  Empty can test negative.  Distal neurovascular function is intact.   Neurological: She is alert and oriented to person, place, and time.  Skin: Skin is warm and dry.  Nursing note and vitals reviewed.    UC Treatments / Results  Labs (all labs ordered are listed, but only abnormal results are displayed) Labs Reviewed  POCT URINE PREGNANCY    EKG  EKG Interpretation None       Radiology Dg Lumbar Spine Complete  Result Date: 03/06/2016 CLINICAL DATA:  Fall. EXAM: LUMBAR SPINE - COMPLETE 4+ VIEW COMPARISON:  None. FINDINGS:  There is no evidence of lumbar spine fracture. Alignment is normal. Intervertebral disc spaces are maintained. IMPRESSION: Negative. Electronically Signed   By: [name] M.D.   On: 03/06/2016 13:58   Dg Elbow Complete Right  Result Date: 03/06/2016 CLINICAL DATA:  [place] down a few steps yesterday. Right elbow injury. EXAM: RIGHT ELBOW - COMPLETE 3+ VIEW COMPARISON:  None. FINDINGS: There is no evidence of fracture, dislocation, or joint effusion. There is no evidence of arthropathy or other focal bone abnormality. Soft tissues are unremarkable. IMPRESSION: Negative. Electronically Signed   By: Misty Stanley M.D.   On: 03/06/2016 13:55   Dg Wrist Complete Right  Result Date: 03/06/2016 CLINICAL DATA:  Pain after trauma EXAM: RIGHT WRIST - COMPLETE 3+ VIEW COMPARISON:  None. FINDINGS: There is no evidence of fracture or dislocation. There is no evidence of arthropathy or other focal bone abnormality. Soft tissues are unremarkable. IMPRESSION: Negative. Electronically Signed   By: Dorise Bullion III M.D   On: 03/06/2016 13:56    Procedures Procedures (including critical care time)  Medications Ordered in UC Medications - No data to display   Initial Impression / Assessment and Plan / UC Course  I have reviewed the triage vital signs and the nursing notes.  Pertinent labs & imaging results that were available during my care of the patient were reviewed by me and considered in my medical decision making (see chart for details).  Clinical Course   No exam findings of DVT. Begin prednisone burst/taper for lower back radicular symptoms.  Begin Flexeril at bedtime.  Rx Lortab for pain at night. Dispensed wrist splint. Apply ice pack for 20 to 30 minutes, 3 to 4 times daily  Continue until pain and swelling decrease.  Wear  wrist brace until improved.  Begin range of motion and stretching exercises in about 5 days. Followup with Dr. Aundria Mems or Dr. Lynne Leader ([facility]) if not improving about two weeks.      Final Clinical Impressions(s) / UC Diagnoses   Final diagnoses:  Sprain of right wrist, initial encounter  Contusion of right elbow, initial encounter  Biceps strain, right, initial encounter  Low back strain, initial encounter    New Prescriptions New Prescriptions   CYCLOBENZAPRINE (FLEXERIL) 10 MG TABLET    Take one tab PO HS prn muscle spasm   HYDROCODONE-ACETAMINOPHEN (NORCO/VICODIN) 5-325 MG TABLET    Take one by mouth at bedtime as needed for pain   PREDNISONE (DELTASONE) 20 MG TABLET    Take one tab by mouth twice daily for 5 days, then one daily. Take with food.     Kandra Nicolas, MD 03/07/16 1345

## 2016-03-06 NOTE — ED Triage Notes (Signed)
Pt states she was coming down out the attic yesterday, c/o right arm, leg and hand pain. Some swelling in right hand.

## 2016-03-06 NOTE — Discharge Instructions (Signed)
Apply ice pack for 20 to 30 minutes, 3 to 4 times daily  Continue until pain and swelling decrease.  Wear wrist brace until improved.  Begin range of motion and stretching exercises in about 5 days.

## 2016-04-06 ENCOUNTER — Ambulatory Visit (HOSPITAL_BASED_OUTPATIENT_CLINIC_OR_DEPARTMENT_OTHER)
Admission: RE | Admit: 2016-04-06 | Discharge: 2016-04-06 | Disposition: A | Discharge status: Home / Self Care | Payer: BLUE CROSS/BLUE SHIELD | Source: Ambulatory Visit | Attending: Physician Assistant | Admitting: Physician Assistant

## 2016-04-06 ENCOUNTER — Ambulatory Visit (INDEPENDENT_AMBULATORY_CARE_PROVIDER_SITE_OTHER): Payer: BLUE CROSS/BLUE SHIELD | Admitting: Physician Assistant

## 2016-04-06 ENCOUNTER — Encounter: Payer: Self-pay | Admitting: Physician Assistant

## 2016-04-06 VITALS — BP 131/80 | HR 62 | Ht 62.0 in | Wt 189.0 lb

## 2016-04-06 DIAGNOSIS — R202 Paresthesia of skin: Secondary | ICD-10-CM | POA: Diagnosis present

## 2016-04-06 DIAGNOSIS — R51 Headache: Secondary | ICD-10-CM | POA: Diagnosis not present

## 2016-04-06 DIAGNOSIS — Z86711 Personal history of pulmonary embolism: Secondary | ICD-10-CM | POA: Diagnosis not present

## 2016-04-06 DIAGNOSIS — R519 Headache, unspecified: Secondary | ICD-10-CM

## 2016-04-06 DIAGNOSIS — R0602 Shortness of breath: Secondary | ICD-10-CM | POA: Insufficient documentation

## 2016-04-06 DIAGNOSIS — R200 Anesthesia of skin: Secondary | ICD-10-CM | POA: Insufficient documentation

## 2016-04-06 MED ORDER — IOPAMIDOL (ISOVUE-370) INJECTION 76%
100.0000 mL | Freq: Once | INTRAVENOUS | Status: AC | PRN
  Administered 2016-04-06: 100 mL via INTRAVENOUS

## 2016-04-06 MED ORDER — GABAPENTIN 100 MG PO CAPS
100.0000 mg | ORAL_CAPSULE | Freq: Three times a day (TID) | ORAL | 0 refills | Status: DC
Start: 2016-04-06 — End: 2016-12-23

## 2016-04-06 MED ORDER — KETOROLAC TROMETHAMINE 60 MG/2ML IM SOLN
60.0000 mg | Freq: Once | INTRAMUSCULAR | Status: AC
  Administered 2016-04-06: 60 mg via INTRAMUSCULAR

## 2016-04-06 NOTE — Progress Notes (Addendum)
Subjective:    Patient ID: Candace Greene, female    DOB: Sep 20, 1970, 45 y.o.   MRN: [id number]  HPI Patient is a 45 y.o. female presenting with numbness and tingling in both legs for one week. Patient notices the tingling more when she puts on her shoes. She describes the sensation as warmness in both of her legs. She also reports being dizzy and lightheaded on and off for a few months. Patient reports blurry vision that comes and goes fothree weeks. She reports that she first noticed the blurry vision after Thanksgiving.   She also complains of a headache on the right side of her head that started yesterday. She reports that she took two Excederin last night and took two more this morning with little relief. She also took mucinex this morning thinking that her head could be hurting because of sinus congestion which gave her little relief.   She also reports that twice this week she has had a difficult time catching breathe. She reports that she gets short of breath when walking. Patient has a history of pulmonary embolism, December 23 of last year. Patient has been off of Xarelto since July of this year. Patient denies chest pain or back pain.     Past Medical History:  Diagnosis Date  . Heel spur    right  . Pulmonary embolus ([facility])    No current outpatient prescriptions on file prior to visit.   No current facility-administered medications on file prior to visit.    Social History   Social History  . Marital status: Married    Spouse name: N/A  . Number of children: 2  . Years of education: N/A   Occupational History  .      [employer]   Social History Main Topics  . Smoking status: Never Smoker  . Smokeless tobacco: Never Used  . Alcohol use 0.0 oz/week     Comment: 3 glasses wine per night  . Drug use: No  . Sexual activity: Not on file   Other Topics Concern  . Not on file   Social History Narrative  . No narrative on file   No Known Allergies  Review of Systems   All other systems reviewed and are negative.     Objective:   Physical Exam  Constitutional: She is oriented to person, place, and time. She appears well-developed and well-nourished. No distress.  HENT:  Head: Normocephalic and atraumatic.  Eyes: Conjunctivae and EOM are normal. Pupils are equal, round, and reactive to light.  Neck:  Muscle tightness around right side of neck and rotator cuff muscles.   Cardiovascular: Normal rate, regular rhythm and normal heart sounds.  Exam reveals no gallop and no friction rub.   No murmur heard. Pulmonary/Chest: Effort normal and breath sounds normal. No respiratory distress. She has no wheezes. She has no rales.  Musculoskeletal: She exhibits no edema.  Neurological: She is alert and oriented to person, place, and time. She has normal reflexes. No cranial nerve deficit.  Cranial Nerves II-XII grossly intact. Alternating movements intact. Strength 5/5 upper and lower extremities.        Assessment & Plan:  Diagnoses and all orders for this visit:   1. Shortness of breath   2. History of pulmonary embolism -CT ANGIO CHEST PE W OR WO CONTRAST due to patient experiencing shortness of breath for one week. Patient also has history of PE one year ago and has been off of Xarelto for 6  months; therefore, suspusion is higher due to history, despite spO2 of 100%. Patient also had a spO2 of 100% when she was diagnosed with a PE last year.   3. Numbness and tingling of lower extremity Unclear etiology. Certainly are ruling out possibility of blood clots that have traveled to lungs, have to consider vitamin deficiencies, may have to rule out MS in the future.  -B12 due to experiencing numbness and tinging.  -BASIC METABOLIC PANEL WITH GFR to monitor electrolyte, liver and kidney function.  - Antinuclear Antib (ANA) due to nonspecific symptoms that could indicate auto-immune etiology.  -VITAMIN D 25 Hydroxy (Vit-D Deficiency, Fractures) due to patient  experiencing fatigue.  -CBC with Differential/Platelet to check for anemia of some sort.  -Ferritin to check for iron deficiency anemia.  -Ordered gabapentin (NEURONTIN) 100 MG capsule; Take 1 capsule (100 mg total) by mouth 3 (three) times daily. Advised patient to not take this medication until the CTA results and blood work are cleared. If everything is cleared, start medication to see if it helps with numbness and tinging in legs.   4. Right-sided headache - Ketorolac (TORADOL) injection 60 mg; Inject 2 mLs (60 mg total) into the muscle once was given in the office today for headache lasting for two days.   -Will call patient when all results are back. Follow-up if symptoms worsen. If CTA is positive then go to the Emergency Department.

## 2016-04-29 ENCOUNTER — Other Ambulatory Visit: Payer: Self-pay | Admitting: Family Medicine

## 2016-04-29 DIAGNOSIS — Z1239 Encounter for other screening for malignant neoplasm of breast: Secondary | ICD-10-CM

## 2016-05-03 ENCOUNTER — Ambulatory Visit: Payer: BLUE CROSS/BLUE SHIELD

## 2016-05-03 ENCOUNTER — Ambulatory Visit (INDEPENDENT_AMBULATORY_CARE_PROVIDER_SITE_OTHER): Payer: BLUE CROSS/BLUE SHIELD

## 2016-05-03 DIAGNOSIS — Z1239 Encounter for other screening for malignant neoplasm of breast: Secondary | ICD-10-CM

## 2016-05-03 DIAGNOSIS — Z1231 Encounter for screening mammogram for malignant neoplasm of breast: Secondary | ICD-10-CM | POA: Diagnosis not present

## 2016-05-04 LAB — CBC WITH DIFFERENTIAL/PLATELET
BASOS ABS: 0 {cells}/uL (ref 0–200)
Basophils Relative: 0 %
EOS PCT: 2 %
Eosinophils Absolute: 94 cells/uL (ref 15–500)
HCT: 34.3 % — ABNORMAL LOW (ref 35.0–45.0)
HEMOGLOBIN: 11.4 g/dL — AB (ref 11.7–15.5)
LYMPHS ABS: 2256 {cells}/uL (ref 850–3900)
Lymphocytes Relative: 48 %
MCH: 31.7 pg (ref 27.0–33.0)
MCHC: 33.2 g/dL (ref 32.0–36.0)
MCV: 95.3 fL (ref 80.0–100.0)
MONOS PCT: 8 %
MPV: 10.8 fL (ref 7.5–12.5)
Monocytes Absolute: 376 cells/uL (ref 200–950)
NEUTROS ABS: 1974 {cells}/uL (ref 1500–7800)
NEUTROS PCT: 42 %
PLATELETS: 293 10*3/uL (ref 140–400)
RBC: 3.6 MIL/uL — AB (ref 3.80–5.10)
RDW: 14.7 % (ref 11.0–15.0)
WBC: 4.7 10*3/uL (ref 3.8–10.8)

## 2016-05-04 LAB — FERRITIN: FERRITIN: 12 ng/mL (ref 10–232)

## 2016-05-04 LAB — BASIC METABOLIC PANEL WITH GFR
BUN: 11 mg/dL (ref 7–25)
CHLORIDE: 106 mmol/L (ref 98–110)
CO2: 25 mmol/L (ref 20–31)
CREATININE: 1.16 mg/dL — AB (ref 0.50–1.10)
Calcium: 8.9 mg/dL (ref 8.6–10.2)
GFR, EST NON AFRICAN AMERICAN: 57 mL/min — AB (ref >=60)
GFR, Est African American: 66 mL/min (ref >=60)
Glucose, Bld: 84 mg/dL (ref 65–99)
Potassium: 4 mmol/L (ref 3.5–5.3)
SODIUM: 138 mmol/L (ref 135–146)

## 2016-05-04 LAB — VITAMIN D 25 HYDROXY (VIT D DEFICIENCY, FRACTURES): VIT D 25 HYDROXY: 26 ng/mL — AB (ref 30–100)

## 2016-05-04 LAB — ANA: ANA: NEGATIVE

## 2016-05-04 LAB — VITAMIN B12: VITAMIN B 12: 829 pg/mL (ref 200–1100)

## 2016-05-04 NOTE — Progress Notes (Signed)
Call pt: ANA negative.  B12 great.  Anemic and iron stores low. Need iron? Have you been able to tolerate in the past? If so 325mg  daily.  Vitamin D is low D3 2000 units daily.

## 2016-05-23 ENCOUNTER — Encounter: Payer: BLUE CROSS/BLUE SHIELD | Admitting: Family Medicine

## 2016-05-24 ENCOUNTER — Ambulatory Visit (INDEPENDENT_AMBULATORY_CARE_PROVIDER_SITE_OTHER): Payer: BLUE CROSS/BLUE SHIELD | Admitting: Family Medicine

## 2016-05-24 ENCOUNTER — Encounter: Payer: Self-pay | Admitting: Family Medicine

## 2016-05-24 VITALS — BP 130/82 | HR 57

## 2016-05-24 DIAGNOSIS — S8991XA Unspecified injury of right lower leg, initial encounter: Secondary | ICD-10-CM | POA: Diagnosis not present

## 2016-05-24 MED ORDER — DICLOFENAC SODIUM 1 % TD GEL: 4.0000 g | Freq: Four times a day (QID) | TRANSDERMAL | 11 refills | Status: DC

## 2016-05-24 NOTE — Patient Instructions (Signed)
Thank you for coming in today. Apply the voltaren gel 4x daily for pain.  Use a hinged knee brace.  Recheck in 2-4 weeks.  Take it easy if able.    Meniscus Tear Introduction A meniscus tear is a knee injury in which a piece of the meniscus is torn. The meniscus is a thick, rubbery, wedge-shaped cartilage in the knee. Two menisci are located in each knee. They sit between the upper bone (femur) and lower bone (tibia) that make up the knee joint. Each meniscus acts as a shock absorber for the knee. A torn meniscus is one of the most common types of knee injuries. This injury can range from mild to severe. Surgery may be needed for a severe tear. What are the causes? This injury may be caused by any squatting, twisting, or pivoting movement. Sports-related injuries are the most common cause. These often occur from:  Running and stopping suddenly.  Changing direction.  Being tackled or knocked off your feet. As people get older, their meniscus gets thinner and weaker. In these people, tears can happen more easily, such as from climbing stairs. What increases the risk? This injury is more likely to happen to:  People who play contact sports.  Males.  People who are 11?46 years of age. What are the signs or symptoms? Symptoms of this injury include:  Knee pain, especially at the side of the knee joint. You may feel pain when the injury occurs, or you may only hear a pop and feel pain later.  A feeling that your knee is clicking, catching, locking, or giving way.  Not being able to fully bend or extend your knee.  Bruising or swelling in your knee. How is this diagnosed? This injury may be diagnosed based on your symptoms and a physical exam. The physical exam may include:  Moving your knee in different ways.  Feeling for tenderness.  Listening for a clicking sound.  Checking if your knee locks or catches. You may also have tests, such as:  X-rays.  MRI.  A procedure  to look inside your knee with a narrow surgical telescope (arthroscopy). You may be referred to a knee specialist (orthopedic surgeon). How is this treated? Treatment for this injury depends on the severity of the tear. Treatment for a mild tear may include:  Rest.  Medicine to reduce pain and swelling. This is usually a nonsteroidal anti-inflammatory drug (NSAID).  A knee brace or an elastic sleeve or wrap.  Using crutches or a walker to keep weight off your knee and to help you walk.  Exercises to strengthen your knee (physical therapy). You may need surgery if you have a severe tear or if other treatments are not working. Follow these instructions at home: Managing pain and swelling  Take over-the-counter and prescription medicines only as told by your health care provider.  If directed, apply ice to the injured area:  Put ice in a plastic bag.  Place a towel between your skin and the bag.  Leave the ice on for 20 minutes, 2-3 times per day.  Raise (elevate) the injured area above the level of your heart while you are sitting or lying down. Activity  Do not use the injured limb to support your body weight until your health care provider says that you can. Use crutches or a walker as told by your health care provider.  Return to your normal activities as told by your health care provider. Ask your health care provider what  activities are safe for you.  Perform range-of-motion exercises only as told by your health care provider.  Begin doing exercises to strengthen your knee and leg muscles only as told by your health care provider. After you recover, your health care provider may recommend these exercises to help prevent another injury. General instructions  Use a knee brace or elastic wrap as told by your health care provider.  Keep all follow-up visits as told by your health care provider. This is important. Contact a health care provider if:  You have a  fever.  Your knee becomes red, tender, or swollen.  Your pain medicine is not helping.  Your symptoms get worse or do not improve after 2 weeks of home care. This information is not intended to replace advice given to you by your health care provider. Make sure you discuss any questions you have with your health care provider. Document Released: 07/02/2002 Document Revised: 09/17/2015 Document Reviewed: 08/04/2014  2017 Elsevier

## 2016-05-24 NOTE — Progress Notes (Signed)
Candace Greene is a 46 y.o. female who presents to [facility] today for right knee injury. Patient slipped and fell at home on ice a few weeks ago. Before she hurt her knee she was essentially pain-free and able to function normally. However when she fell she developed immediate pain in the anterior and posterior aspects of her knee. She eventually was unable to tolerate any pain any longer and presented to the emergency department on January 24. During that visit she was found to have a mild knee effusion but no fractures. She notes continued pain especially at the anterior portion the knee where she hit the ground and along the posterior aspect of the knee. She denies any radiating pain weakness or numbness. She denies significant catching or locking at this time but does occasionally experience a pop. She denies any fevers or chills nausea or vomiting. She has not tried much treatment yet. She currently is able to work however does have pain at work.   Past Medical History:  Diagnosis Date  . Heel spur    right  . Pulmonary embolus [facility])    Past Surgical History:  Procedure Laterality Date  . No prior surgery     Social History  Substance Use Topics  . Smoking status: Never Smoker  . Smokeless tobacco: Never Used  . Alcohol use 0.0 oz/week     Comment: 3 glasses wine per night     ROS:  As above   Medications: Current Outpatient Prescriptions  Medication Sig Dispense Refill  . diclofenac sodium (VOLTAREN) 1 % GEL Apply 4 g topically 4 (four) times daily. To affected joint. 100 g 11  . gabapentin (NEURONTIN) 100 MG capsule Take 1 capsule (100 mg total) by mouth 3 (three) times daily. 90 capsule 0   No current facility-administered medications for this visit.    No Known Allergies   Exam:  BP 130/82   Pulse (!) 57  General: Well Developed, well nourished, and in no acute distress.  Neuro/Psych: Alert and oriented x3,  extra-ocular muscles intact, able to move all 4 extremities, sensation grossly intact. Skin: Warm and dry, no rashes noted.  Respiratory: Not using accessory muscles, speaking in full sentences, trachea midline.  Cardiovascular: Pulses palpable, no extremity edema. Abdomen: Does not appear distended. MSK: Right knee with mild effusion. Otherwise normal-appearing. Range of motion 0-120. Mildly tender anterior proximal tibia and along the posterior aspect of the knee. Intact flexion and extension strength. Positive medial McMurray's test negative lateral McMurray's test. Lax anterior drawer test normal posterior drawer test. Pain-free and no laxity with stress of LCL and MCL. Pulses capillary refill and sensation intact distally.  XR Knee Min 3 Views Right1/24/2018 [facility] Result Impression  IMPRESSION:  Small joint effusion. No fracture.  Result Narrative  TECHNIQUE:XR KNEE 3 VIEWS RIGHT   INDICATION: Knee Pain  COMPARISON: None.  FINDINGS:  No acute fracture or subluxation. Alignment at the knee is well-maintained. Small joint effusion. No acute soft tissue findings.  Status      No results found for this or any previous visit (from the past 48 hour(s)). No results found.    Assessment and Plan: 46 y.o. female with right knee pain following injury at home. Concerning for meniscus or anterior cruciate ligament injury. This may be simple contusion. Plan for a little bit of watchful waiting with relative rest and home exercise program working on extension strengthening. We'll use a hinged knee brace and  topical diclofenac gel. Recheck in about 2 weeks or so. If not better will likely proceed with further workup to evaluate potential ligamentous or meniscus injury.    No orders of the defined types were placed in this encounter.   Discussed warning signs or symptoms. Please see discharge instructions. Patient expresses understanding.  CC: METHENEY,CATHERINE,  MD

## 2016-05-26 ENCOUNTER — Ambulatory Visit: Payer: BLUE CROSS/BLUE SHIELD | Admitting: Family Medicine

## 2016-06-08 ENCOUNTER — Ambulatory Visit: Payer: BLUE CROSS/BLUE SHIELD | Admitting: Family Medicine

## 2016-06-27 ENCOUNTER — Encounter: Payer: Self-pay | Admitting: Family Medicine

## 2016-06-27 ENCOUNTER — Ambulatory Visit (INDEPENDENT_AMBULATORY_CARE_PROVIDER_SITE_OTHER): Payer: BLUE CROSS/BLUE SHIELD | Admitting: Family Medicine

## 2016-06-27 DIAGNOSIS — M25561 Pain in right knee: Secondary | ICD-10-CM | POA: Diagnosis not present

## 2016-06-27 DIAGNOSIS — M79604 Pain in right leg: Secondary | ICD-10-CM | POA: Insufficient documentation

## 2016-06-27 MED ORDER — LORAZEPAM 0.5 MG PO TABS: ORAL_TABLET | ORAL | 0 refills | Status: DC

## 2016-06-27 NOTE — Progress Notes (Signed)
   Candace Greene is a 46 y.o. female who presents to [place] today for follow-up right knee pain. Patient was seen January 30 for knee pain after an injury. She was thought to have a contusion versus more serious meniscus or ligamentous injury in her knee. She had a trial of watchful waiting with diclofenac gel. She notes that she is improved a little bit but still having considerable lateral pain with activity that limits her ability to do normal activities.   Past Medical History:  Diagnosis Date  . Heel spur    right  . Pulmonary embolus [facility])    Past Surgical History:  Procedure Laterality Date  . No prior surgery     Social History  Substance Use Topics  . Smoking status: Never Smoker  . Smokeless tobacco: Never Used  . Alcohol use 0.0 oz/week     Comment: 3 glasses wine per night     ROS:  As above   Medications: Current Outpatient Prescriptions  Medication Sig Dispense Refill  . diclofenac sodium (VOLTAREN) 1 % GEL Apply 4 g topically 4 (four) times daily. To affected joint. 100 g 11  . gabapentin (NEURONTIN) 100 MG capsule Take 1 capsule (100 mg total) by mouth 3 (three) times daily. 90 capsule 0  . LORazepam (ATIVAN) 0.5 MG tablet 1 tab 30-60 min prior to mri 2 tablet 0   No current facility-administered medications for this visit.    No Known Allergies   Exam:  BP 122/67   Pulse 67   Wt 194 lb (88 kg)   BMI 35.48 kg/m  General: Well Developed, well nourished, and in no acute distress.  Neuro/Psych: Alert and oriented x3, extra-ocular muscles intact, able to move all 4 extremities, sensation grossly intact. Skin: Warm and dry, no rashes noted.  Respiratory: Not using accessory muscles, speaking in full sentences, trachea midline.  Cardiovascular: Pulses palpable, no extremity edema. Abdomen: Does not appear distended. MSK: Right knee normal-appearing without effusion. Tender palpation lateral joint line.  Positive lateral McMurray's sign. Stable ligamentous exam to valgus and varus stress. Possible laxity with anterior drawer testing area intact flexion and extension strength.    No results found for this or any previous visit (from the past 48 hour(s)). No results found.    Assessment and Plan: 46 y.o. female with  right knee pain following injury concerning for lateral meniscus versus ligamentous injury. Plan for MRI. Follow-up after MRI.    No orders of the defined types were placed in this encounter.   Discussed warning signs or symptoms. Please see discharge instructions. Patient expresses understanding.

## 2016-06-27 NOTE — Patient Instructions (Signed)
Thank you for coming in today. You should hear about the MRI soon.  Follow up 2 days after the MRI to go over results.   Take ativan prior to the MRI to calm you down.  Do not drive yourself on ativan.

## 2016-07-05 ENCOUNTER — Encounter: Payer: Self-pay | Admitting: Family Medicine

## 2016-07-05 ENCOUNTER — Ambulatory Visit (INDEPENDENT_AMBULATORY_CARE_PROVIDER_SITE_OTHER): Payer: BLUE CROSS/BLUE SHIELD | Admitting: Family Medicine

## 2016-07-05 DIAGNOSIS — H8113 Benign paroxysmal vertigo, bilateral: Secondary | ICD-10-CM

## 2016-07-05 DIAGNOSIS — R5383 Other fatigue: Secondary | ICD-10-CM

## 2016-07-05 DIAGNOSIS — E559 Vitamin D deficiency, unspecified: Secondary | ICD-10-CM

## 2016-07-05 DIAGNOSIS — D508 Other iron deficiency anemias: Secondary | ICD-10-CM

## 2016-07-05 NOTE — Progress Notes (Signed)
Subjective:    Patient ID: Candace Greene, female    DOB: 10/05/1970, 46 y.o.   MRN: [id number]  HPI C/O dizziness that just started this morning. She got up out of the bed and felt fine and started to do her daily activities when suddenly she felt dizzy. She went to lay down on her bed and it seemed to get a little better but then when she moved her head to the side it started again. It only lasts for a few minutes and then subsides. She denies any known head trauma or injury. denies any SOB,CP she has not had anything to eat and has only drank glass of OJ and water. she reports that she has been feeling tired for past 2-3 wks. She denies any recent upper respiratory infections. No ear pain. No hearing loss. She says it feels more like room spinning and just lasts a few seconds when she moves her head.   Review of Systems  No SOB no hearing loss or ear pain.   SpO2 98%     No Known Allergies  Past Medical History:  Diagnosis Date  . Heel spur    right  . Pulmonary embolus [facility])     Past Surgical History:  Procedure Laterality Date  . No prior surgery      Social History   Social History  . Marital status: Married    Spouse name: N/A  . Number of children: 2  . Years of education: N/A   Occupational History  .      [employer]   Social History Main Topics  . Smoking status: Never Smoker  . Smokeless tobacco: Never Used  . Alcohol use 0.0 oz/week     Comment: 3 glasses wine per night  . Drug use: No  . Sexual activity: Not on file   Other Topics Concern  . Not on file   Social History Narrative  . No narrative on file    Family History  Problem Relation Age of Onset  . Stroke Father     <60  . Heart attack Father   . Birth defects Other   . Hyperlipidemia Other   . Breast cancer Maternal Grandmother   . Breast cancer Maternal Aunt   . Cancer      GF    Outpatient Encounter Prescriptions as of 07/05/2016  Medication Sig  . diclofenac sodium (VOLTAREN) 1  % GEL Apply 4 g topically 4 (four) times daily. To affected joint.  Marland Kitchen gabapentin (NEURONTIN) 100 MG capsule Take 1 capsule (100 mg total) by mouth 3 (three) times daily.  . [DISCONTINUED] LORazepam (ATIVAN) 0.5 MG tablet 1 tab 30-60 min prior to mri   No facility-administered encounter medications on file as of 07/05/2016.          Objective:   Physical Exam  Constitutional: She is oriented to person, place, and time. She appears well-developed and well-nourished.  HENT:  Head: Normocephalic and atraumatic.  Right Ear: External ear normal.  Left Ear: External ear normal.  Nose: Nose normal.  Mouth/Throat: Oropharynx is clear and moist.  TMs and canals are clear.   Eyes: Conjunctivae and EOM are normal. Pupils are equal, round, and reactive to light.  Neck: Neck supple. No thyromegaly present.  Cardiovascular: Normal rate, regular rhythm and normal heart sounds.   Pulmonary/Chest: Effort normal and breath sounds normal. She has no wheezes.  Lymphadenopathy:    She has no cervical adenopathy.  Neurological: She is alert  and oriented to person, place, and time.  Dix-Hallpike maneuver positive to the right and to the left. I did go ahead and do an Epley maneuver on her while she was here to the left.  Skin: Skin is warm and dry.  Psychiatric: She has a normal mood and affect. Her behavior is normal.        Assessment & Plan:  Fatigue - Iron was a little bit low in January as well as her vitamin D.Kircher to continue her supplementation for 1 more month and let's recheck iron and vitamin D levels.  Dizziness - Most consistent with benign positional vertigo. Also consider vestibular neuritis as a possibility. Will give handout to do exercises on her own at home. Call if not significantly better in one week.  Orthostatics and BP look normal today.    Iron def anemia - currently taking 1 iron tab daily. Try to increase to twice a day if tolerated. Recheck level in one month.  Vitamin  D deficiency-currently on daily supplementation. Due to recheck level in one month.

## 2016-07-05 NOTE — Patient Instructions (Signed)
Call if not better in one week.  

## 2016-08-09 ENCOUNTER — Ambulatory Visit (INDEPENDENT_AMBULATORY_CARE_PROVIDER_SITE_OTHER): Payer: BLUE CROSS/BLUE SHIELD | Admitting: Sports Medicine

## 2016-08-09 ENCOUNTER — Encounter: Payer: Self-pay | Admitting: Sports Medicine

## 2016-08-09 ENCOUNTER — Ambulatory Visit (INDEPENDENT_AMBULATORY_CARE_PROVIDER_SITE_OTHER): Payer: BLUE CROSS/BLUE SHIELD

## 2016-08-09 DIAGNOSIS — M79674 Pain in right toe(s): Secondary | ICD-10-CM | POA: Diagnosis not present

## 2016-08-09 DIAGNOSIS — M79671 Pain in right foot: Secondary | ICD-10-CM

## 2016-08-09 MED ORDER — MELOXICAM 15 MG PO TABS: ORAL_TABLET | ORAL | 3 refills | Status: DC

## 2016-08-09 NOTE — Assessment & Plan Note (Signed)
Acute trauma yesterday, dropped a heavy can on her first MTP, x-rays, meloxicam.  Return in 2 weeks. Suspect a simple contusion. She does have a bunion here and likely first MTP degenerative changes. If there is a fracture we will bring her back for a postop shoe.  She doesn't have any sick days so she is unable to take time off of work. If persistent pain at the two-week follow-up we will do an injection into the joint.

## 2016-08-09 NOTE — Progress Notes (Signed)
   Subjective:    I'm seeing this patient as a consultation for:  Dr. Beatrice Lecher  CC: Right foot injury  HPI: Yesterday this pleasant 46 year old female dropped a can on the dorsum of her first MTP. She had immediate pain, minimal swelling, no bruising. She was able to walk after the incident. Today pain is moderate, persistent, localized without radiation.  Past medical history:  Negative.  See flowsheet/record as well for more information.  Surgical history: Negative.  See flowsheet/record as well for more information.  Family history: Negative.  See flowsheet/record as well for more information.  Social history: Negative.  See flowsheet/record as well for more information.  Allergies, and medications have been entered into the medical record, reviewed, and no changes needed.   Review of Systems: No headache, visual changes, nausea, vomiting, diarrhea, constipation, dizziness, abdominal pain, skin rash, fevers, chills, night sweats, weight loss, swollen lymph nodes, body aches, joint swelling, muscle aches, chest pain, shortness of breath, mood changes, visual or auditory hallucinations.   Objective:   General: Well Developed, well nourished, and in no acute distress.  Neuro/Psych: Alert and oriented x3, extra-ocular muscles intact, able to move all 4 extremities, sensation grossly intact. Skin: Warm and dry, no rashes noted.  Respiratory: Not using accessory muscles, speaking in full sentences, trachea midline.  Cardiovascular: Pulses palpable, no extremity edema. Abdomen: Does not appear distended. Right Foot: Only minimal swelling over the first MTP with a visible bunion Range of motion is full in all directions. Strength is 5/5 in all directions. No pes cavus or pes planus. No abnormal callus noted. No pain over the navicular prominence, or base of fifth metatarsal. No tenderness to palpation of the calcaneal insertion of plantar fascia. No pain at the Achilles  insertion. No pain over the calcaneal bursa. No pain of the retrocalcaneal bursa. No tenderness to palpation over the tarsals, metatarsals, or phalanges. No hallux rigidus or limitus. There is very mild tenderness over the first MTP. No pain with compression of the metatarsal heads. Neurovascularly intact distally.  X-ray show degenerative changes throughout the midfoot and forefoot but no evidence of fracture.  Impression and Recommendations:   This case required medical decision making of moderate complexity.  Right foot pain Acute trauma yesterday, dropped a heavy can on her first MTP, x-rays, meloxicam.  Return in 2 weeks. Suspect a simple contusion. She does have a bunion here and likely first MTP degenerative changes. If there is a fracture we will bring her back for a postop shoe.  She doesn't have any sick days so she is unable to take time off of work. If persistent pain at the two-week follow-up we will do an injection into the joint.

## 2016-09-26 ENCOUNTER — Telehealth: Payer: Self-pay

## 2016-09-26 NOTE — Telephone Encounter (Signed)
Pt called and needs a tooth pulled tomorrow at 7 am, and she wanted to verify it will be alright.  Dentist wanted her to verify due to previously being on blood thinners.  Please advise.  Please let pt know, it can be left on her VM.

## 2016-09-26 NOTE — Telephone Encounter (Signed)
OK to have tooth pulled.

## 2016-09-27 NOTE — Telephone Encounter (Signed)
Pt reports that she did have the tooth pulled w/o complication. Candace Greene, [name]

## 2016-10-24 ENCOUNTER — Ambulatory Visit (INDEPENDENT_AMBULATORY_CARE_PROVIDER_SITE_OTHER): Payer: BLUE CROSS/BLUE SHIELD | Admitting: Family Medicine

## 2016-10-24 ENCOUNTER — Encounter: Payer: Self-pay | Admitting: Family Medicine

## 2016-10-24 VITALS — BP 96/60 | HR 58 | Resp 18 | Wt 193.4 lb

## 2016-10-24 DIAGNOSIS — K625 Hemorrhage of anus and rectum: Secondary | ICD-10-CM

## 2016-10-24 DIAGNOSIS — R195 Other fecal abnormalities: Secondary | ICD-10-CM

## 2016-10-24 LAB — BASIC METABOLIC PANEL WITH GFR
BUN: 11 mg/dL (ref 7–25)
CO2: 22 mmol/L (ref 20–31)
Calcium: 8.8 mg/dL (ref 8.6–10.2)
Chloride: 107 mmol/L (ref 98–110)
Creat: 1.12 mg/dL — ABNORMAL HIGH (ref 0.50–1.10)
GFR, EST AFRICAN AMERICAN: 69 mL/min (ref >=60)
GFR, EST NON AFRICAN AMERICAN: 59 mL/min — AB (ref >=60)
GLUCOSE: 85 mg/dL (ref 65–99)
POTASSIUM: 3.9 mmol/L (ref 3.5–5.3)
Sodium: 137 mmol/L (ref 135–146)

## 2016-10-24 LAB — CBC
HEMATOCRIT: 37.1 % (ref 35.0–45.0)
Hemoglobin: 12.3 g/dL (ref 11.7–15.5)
MCH: 32.2 pg (ref 27.0–33.0)
MCHC: 33.2 g/dL (ref 32.0–36.0)
MCV: 97.1 fL (ref 80.0–100.0)
MPV: 11.2 fL (ref 7.5–12.5)
Platelets: 285 10*3/uL (ref 140–400)
RBC: 3.82 MIL/uL (ref 3.80–5.10)
RDW: 14.4 % (ref 11.0–15.0)
WBC: 4.8 10*3/uL (ref 3.8–10.8)

## 2016-10-24 NOTE — Patient Instructions (Signed)
We will place a referral for GI today. He should be hearing from them soon. We will call as soon as the  lab work is back as well.

## 2016-10-24 NOTE — Progress Notes (Signed)
Subjective:    Patient ID: Candace Greene, female    DOB: 1970/08/21, 46 y.o.   MRN: [id number]  HPI 46 year old female comes in today because she saw some bright red blood in the toilet on Friday, approximately 3 days ago. She said she had a normal bowel movement and afterwards noticed some blood. She denies a hard stool or having to strain. She also reports that some of her stools more recently have looked black. She said she was on iron at 1. for iron deficiency but has not been taking it more recently. She reports that she's been feeling a little bit more weak and tired and drained recently. She did start her period over the weekend.  She is not currently taking any NSAIDs. She does have a history of hemorrhoids and felt like they were flared maybe about a week ago but not this past week.  No family history of colon cancer, Crohn's disease or ulcerative colitis.  Review of Systems  BP (!) 110/55   Pulse (!) 58   Resp 18   Wt 193 lb 6.4 oz (87.7 kg)   BMI 35.37 kg/m     No Known Allergies  Past Medical History:  Diagnosis Date  . Heel spur    right  . Pulmonary embolus [facility])     Past Surgical History:  Procedure Laterality Date  . No prior surgery      Social History   Social History  . Marital status: Married    Spouse name: N/A  . Number of children: 2  . Years of education: N/A   Occupational History  .      [employer]   Social History Main Topics  . Smoking status: Never Smoker  . Smokeless tobacco: Never Used  . Alcohol use 0.0 oz/week     Comment: 3 glasses wine per night  . Drug use: No  . Sexual activity: Not on file   Other Topics Concern  . Not on file   Social History Narrative  . No narrative on file    Family History  Problem Relation Age of Onset  . Stroke Father        <60  . Heart attack Father   . Birth defects Other   . Hyperlipidemia Other   . Breast cancer Maternal Grandmother   . Breast cancer Maternal Aunt   . Cancer Unknown         GF    Outpatient Encounter Prescriptions as of 10/24/2016  Medication Sig  . diclofenac sodium (VOLTAREN) 1 % GEL Apply 4 g topically 4 (four) times daily. To affected joint.  Marland Kitchen gabapentin (NEURONTIN) 100 MG capsule Take 1 capsule (100 mg total) by mouth 3 (three) times daily.  . meloxicam (MOBIC) 15 MG tablet One tab PO qAM with breakfast for 2 weeks, then daily prn pain.   No facility-administered encounter medications on file as of 10/24/2016.          Objective:   Physical Exam  Constitutional: She is oriented to person, place, and time. She appears well-developed and well-nourished.  HENT:  Head: Normocephalic and atraumatic.  Cardiovascular: Normal rate, regular rhythm and normal heart sounds.   Pulmonary/Chest: Effort normal and breath sounds normal.  Abdominal: She exhibits no distension and no mass. There is tenderness. There is no rebound and no guarding.  + TTP over the midabdomen.   Genitourinary:  Genitourinary Comments: 2 small hemorrhoids externally but they are not inflamed or swollen. Good rectal  tone. No rectal masses. No stool in the vault. Guaiac performed.  Neurological: She is alert and oriented to person, place, and time.  Skin: Skin is warm and dry.  Psychiatric: She has a normal mood and affect. Her behavior is normal.         Assessment & Plan:  Rectal bleeding-some concern for upper GI bleeding especially with dark appearing stools. We'll do a rectal exam today. We'll check CBC since she's also been experiencing some fatigue and weakness. I'm concerned that she may be anemic again.Avoid any NSAID products. Make sure hydrating well. Her blood pressures a little on the low side today as well.  Guaiac positive-we'll refer to gastroenterology for further evaluation.  History of iron deficiency anemia-recheck hemoglobin today.

## 2016-11-03 ENCOUNTER — Telehealth: Payer: Self-pay | Admitting: Family Medicine

## 2016-11-03 NOTE — Telephone Encounter (Signed)
Pt called. She needs an updated letter for her to use the Restroom at work. Her phone # is [phone].  Thank you.

## 2016-11-04 NOTE — Telephone Encounter (Signed)
Letter ready for pick up. Pt notified -EH/RMA

## 2016-11-16 ENCOUNTER — Telehealth: Payer: Self-pay | Admitting: Family Medicine

## 2016-11-16 NOTE — Telephone Encounter (Signed)
Letter faxed -EH/RMA

## 2016-11-16 NOTE — Telephone Encounter (Signed)
Pt called. Employers are asking for a beginning and end date to be included  on her letter to be out of work. Thank you

## 2016-11-25 LAB — HM COLONOSCOPY

## 2016-12-09 ENCOUNTER — Encounter: Payer: Self-pay | Admitting: Family Medicine

## 2016-12-23 ENCOUNTER — Encounter: Payer: Self-pay | Admitting: Family Medicine

## 2016-12-23 ENCOUNTER — Ambulatory Visit (INDEPENDENT_AMBULATORY_CARE_PROVIDER_SITE_OTHER): Payer: BLUE CROSS/BLUE SHIELD | Admitting: Family Medicine

## 2016-12-23 VITALS — BP 129/68 | HR 61 | Wt 194.0 lb

## 2016-12-23 DIAGNOSIS — T63481A Toxic effect of venom of other arthropod, accidental (unintentional), initial encounter: Secondary | ICD-10-CM

## 2016-12-23 DIAGNOSIS — Z23 Encounter for immunization: Secondary | ICD-10-CM

## 2016-12-23 DIAGNOSIS — R200 Anesthesia of skin: Secondary | ICD-10-CM | POA: Diagnosis not present

## 2016-12-23 NOTE — Progress Notes (Signed)
   Subjective:    Patient ID: Candace Greene, female    DOB: 28-Feb-1971, 46 y.o.   MRN: [id number]  HPI 37-year-old female comes in today because of arm swelling. She thinks something may have bitten her. Started about 2 days ago on her right forearm. She's been using alcohol on it. He says the swelling has gone down significantly today compared to even yesterday. No fevers or chills. She had a little bit of pain towards her hand but no numbness or tingling. She will need a note to return to work on Monday. 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   She did experience some numbness in her right leg. She was lying on her side when her right whole leg went numb. She says it most of the rest the night to get relief. She had a second Epsom salts. It did eventually go away. She occ has some right mid back pain.  Nothing radiating down into the buttock area day.  Review of Systems     Objective:   Physical Exam  Constitutional: She is oriented to person, place, and time. She appears well-developed and well-nourished.  HENT:  Head: Normocephalic and atraumatic.  Eyes: Conjunctivae and EOM are normal.  Cardiovascular: Normal rate.   Pulmonary/Chest: Effort normal.  Neurological: She is alert and oriented to person, place, and time.  Skin: Skin is dry. No pallor.  Right forearm with some swelling over the posterior forearm with distal little bit of erythema. There is some excoriations at the center arm able to palpate a little firmness underneath the skin.  Psychiatric: She has a normal mood and affect. Her behavior is normal.  Vitals reviewed.         Assessment & Plan:  Insect sting-had a quite brisk local reaction but seems to be improving on its own. Okay to return to work on Monday.  Right leg numbness - since it was her entire leg I suspect it was a pinched nerve in her lumbar spine. But it did seem to resolve by the next day. Encouraged her to let me know if  it occurs again. Right now she's not having any concerning pain in her back or numbness in her leg.  Given flu vaccine today.

## 2017-01-02 ENCOUNTER — Ambulatory Visit (INDEPENDENT_AMBULATORY_CARE_PROVIDER_SITE_OTHER): Payer: BLUE CROSS/BLUE SHIELD | Admitting: Family Medicine

## 2017-01-02 ENCOUNTER — Ambulatory Visit: Payer: BLUE CROSS/BLUE SHIELD

## 2017-01-02 DIAGNOSIS — M79604 Pain in right leg: Secondary | ICD-10-CM | POA: Diagnosis not present

## 2017-01-02 DIAGNOSIS — M7989 Other specified soft tissue disorders: Secondary | ICD-10-CM

## 2017-01-02 NOTE — Progress Notes (Signed)
Subjective:    Patient ID: Candace Greene, female    DOB: [birth year], 46 y.o.   MRN: [id number]  HPI 46 year old female w/ hx of PE comes in today complaining of right leg pain. She actually mentions some right leg numbness when I saw her about 2 weeks ago. She felt like it was affecting the entire leg. She soaked in some Epsom salts and by the next day actually seemed to go away. She says since then it's been aching on and off, but then a couple of days ago she tripped over a [employer] and landed on her left side. She didn't directly hit the right leg but feels it may have aggrevated things.  Now having more severe particularly behind the right knee. In fact her husband took a picture because it looked a little bit red. She showed me they photo on her phone and it  She said yesterday her right foot was very swollen as well.  Her right foot is not as swollen today.    Review of Systems  There were no vitals taken for this visit.    No Known Allergies  Past Medical History:  Diagnosis Date  . Heel spur    right  . Pulmonary embolus [facility])     Past Surgical History:  Procedure Laterality Date  . No prior surgery      Social History   Social History  . Marital status: Married    Spouse name: N/A  . Number of children: 2  . Years of education: N/A   Occupational History  .      [employer]   Social History Main Topics  . Smoking status: Never Smoker  . Smokeless tobacco: Never Used  . Alcohol use 0.0 oz/week     Comment: 3 glasses wine per night  . Drug use: No  . Sexual activity: Not on file   Other Topics Concern  . Not on file   Social History Narrative  . No narrative on file    Family History  Problem Relation Age of Onset  . Stroke Father        <60  . Heart attack Father   . Birth defects Other   . Hyperlipidemia Other   . Breast cancer Maternal Grandmother   . Breast cancer Maternal Aunt   . Cancer Unknown        GF    Outpatient Encounter  Prescriptions as of 01/02/2017  Medication Sig  . diclofenac sodium (VOLTAREN) 1 % GEL Apply 4 g topically 4 (four) times daily. To affected joint.  . meloxicam (MOBIC) 15 MG tablet One tab PO qAM with breakfast for 2 weeks, then daily prn pain.   No facility-administered encounter medications on file as of 01/02/2017.          Objective:   Physical Exam  Constitutional: She is oriented to person, place, and time. She appears well-developed and well-nourished.  HENT:  Head: Normocephalic and atraumatic.  Cardiovascular: Normal rate, regular rhythm and normal heart sounds.   Pulmonary/Chest: Effort normal and breath sounds normal.  Musculoskeletal:  Right leg with NROM of hip, knee and ankle. No significant erythema behind the knee today. Her calf does look a little swollen and shiny. She's very tender behind  Knee and over the upper calf. She had pain with calf squeeze. She also has pain over the calf and behind the knee with dorsiflexion of the foot.  Neurological: She is alert and oriented to person,  place, and time.  Skin: Skin is warm and dry.  Psychiatric: She has a normal mood and affect. Her behavior is normal.          Assessment & Plan:  Right leg pain - eval for DVT.  I'm concerned enough to undergo ahead and get an ultrasound today will call with results once available.

## 2017-01-05 ENCOUNTER — Ambulatory Visit (INDEPENDENT_AMBULATORY_CARE_PROVIDER_SITE_OTHER): Payer: BLUE CROSS/BLUE SHIELD | Admitting: Family Medicine

## 2017-01-05 ENCOUNTER — Encounter: Payer: Self-pay | Admitting: Family Medicine

## 2017-01-05 ENCOUNTER — Ambulatory Visit (INDEPENDENT_AMBULATORY_CARE_PROVIDER_SITE_OTHER): Payer: BLUE CROSS/BLUE SHIELD

## 2017-01-05 VITALS — BP 114/69 | HR 58 | Wt 197.0 lb

## 2017-01-05 DIAGNOSIS — M25561 Pain in right knee: Secondary | ICD-10-CM | POA: Diagnosis not present

## 2017-01-05 MED ORDER — DICLOFENAC SODIUM 1 % TD GEL: 4.0000 g | Freq: Four times a day (QID) | TRANSDERMAL | 11 refills | Status: DC

## 2017-01-05 NOTE — Patient Instructions (Signed)
Thank you for coming in today. Continue meloxicam for pain  Use diclofenac gel 4x daily for pain.  Recheck in 3 weeks or sooner if needed.  Call or go to the ER if you develop a large red swollen joint with extreme pain or oozing puss.     Meniscus Tear A meniscus tear is a knee injury in which a piece of the meniscus is torn. The meniscus is a thick, rubbery, wedge-shaped cartilage in the knee. Two menisci are located in each knee. They sit between the upper bone (femur) and lower bone (tibia) that make up the knee joint. Each meniscus acts as a shock absorber for the knee. A torn meniscus is one of the most common types of knee injuries. This injury can range from mild to severe. Surgery may be needed for a severe tear. What are the causes? This injury may be caused by any squatting, twisting, or pivoting movement. Sports-related injuries are the most common cause. These often occur from:  Running and stopping suddenly.  Changing direction.  Being tackled or knocked off your feet.  As people get older, their meniscus gets thinner and weaker. In these people, tears can happen more easily, such as from climbing stairs. What increases the risk? This injury is more likely to happen to:  People who play contact sports.  Males.  People who are 44?46 years of age.  What are the signs or symptoms? Symptoms of this injury include:  Knee pain, especially at the side of the knee joint. You may feel pain when the injury occurs, or you may only hear a pop and feel pain later.  A feeling that your knee is clicking, catching, locking, or giving way.  Not being able to fully bend or extend your knee.  Bruising or swelling in your knee.  How is this diagnosed? This injury may be diagnosed based on your symptoms and a physical exam. The physical exam may include:  Moving your knee in different ways.  Feeling for tenderness.  Listening for a clicking sound.  Checking if your knee  locks or catches.  You may also have tests, such as:  X-rays.  MRI.  A procedure to look inside your knee with a narrow surgical telescope (arthroscopy).  You may be referred to a knee specialist (orthopedic surgeon). How is this treated? Treatment for this injury depends on the severity of the tear. Treatment for a mild tear may include:  Rest.  Medicine to reduce pain and swelling. This is usually a nonsteroidal anti-inflammatory drug (NSAID).  A knee brace or an elastic sleeve or wrap.  Using crutches or a walker to keep weight off your knee and to help you walk.  Exercises to strengthen your knee (physical therapy).  You may need surgery if you have a severe tear or if other treatments are not working. Follow these instructions at home: Managing pain and swelling  Take over-the-counter and prescription medicines only as told by your health care provider.  If directed, apply ice to the injured area: ? Put ice in a plastic bag. ? Place a towel between your skin and the bag. ? Leave the ice on for 20 minutes, 2-3 times per day.  Raise (elevate) the injured area above the level of your heart while you are sitting or lying down. Activity  Do not use the injured limb to support your body weight until your health care provider says that you can. Use crutches or a walker as told by  your health care provider.  Return to your normal activities as told by your health care provider. Ask your health care provider what activities are safe for you.  Perform range-of-motion exercises only as told by your health care provider.  Begin doing exercises to strengthen your knee and leg muscles only as told by your health care provider. After you recover, your health care provider may recommend these exercises to help prevent another injury. General instructions  Use a knee brace or elastic wrap as told by your health care provider.  Keep all follow-up visits as told by your health care  provider. This is important. Contact a health care provider if:  You have a fever.  Your knee becomes red, tender, or swollen.  Your pain medicine is not helping.  Your symptoms get worse or do not improve after 2 weeks of home care. This information is not intended to replace advice given to you by your health care provider. Make sure you discuss any questions you have with your health care provider. Document Released: 07/02/2002 Document Revised: 09/17/2015 Document Reviewed: 08/04/2014 Elsevier Interactive Patient Education  [employer].

## 2017-01-05 NOTE — Progress Notes (Signed)
Subjective:    I'm seeing this patient as a consultation for:  Candace Marry, MD   CC: Right knee pain  HPI: Patient tripped on a skateboard about 10 days ago. Since then she's had pain and swelling of the knee and calf. She was seen by her PCP on the 10th. Given her history of pulmonary embolism a duplex ultrasound was obtained to check for DVT. This was negative. Since then she's noted continued pain and swelling of the knee and proximal calf especially at the posterior aspect of the knee and calf. She denies any radiating pain weakness or numbness. She's not had any specific treatment yet aside from over-the-counter medications which have been mildly helpful. The pain is moderate and interferes with her ability to walk normally and work normally.  Past medical history, Surgical history, Family history not pertinant except as noted below, Social history, Allergies, and medications have been entered into the medical record, reviewed, and no changes needed.   Review of Systems: No headache, visual changes, nausea, vomiting, diarrhea, constipation, dizziness, abdominal pain, skin rash, fevers, chills, night sweats, weight loss, swollen lymph nodes, body aches, joint swelling, muscle aches, chest pain, shortness of breath, mood changes, visual or auditory hallucinations.   Objective:    Vitals:   01/05/17 1552  BP: 114/69  Pulse: (!) 58   General: Well Developed, well nourished, and in no acute distress.  Neuro/Psych: Alert and oriented x3, extra-ocular muscles intact, able to move all 4 extremities, sensation grossly intact. Skin: Warm and dry, no rashes noted.  Respiratory: Not using accessory muscles, speaking in full sentences, trachea midline.  Cardiovascular: Pulses palpable, no extremity edema. Abdomen: Does not appear distended. MSK:  Right knee mild effusion no erythema. Tender to palpation medial joint line. Range of motion 5-100. Stable anterior posterior drawer  testing. Stable valgus and varus stress testing. Positive medial McMurray's test. Negative lateral. Intact flexion and extension strength.  X-ray right knee: Mild medial compartment DJD otherwise unremarkable with no acute fractures. Awaiting formal radiology review   Limited musculoskeletal ultrasound of the anterior knee reveals a mild joint effusion.  Procedure: Real-time Ultrasound Guided Injection of right knee  Device: GE Logiq E  Images permanently stored and available for review in the ultrasound unit. Verbal informed consent obtained. Discussed risks and benefits of procedure. Warned about infection bleeding damage to structures skin hypopigmentation and fat atrophy among others. Patient expresses understanding and agreement Time-out conducted.  Noted no overlying erythema, induration, or other signs of local infection.  Skin prepped in a sterile fashion.  Local anesthesia: Topical Ethyl chloride.  With sterile technique and under real time ultrasound guidance: 80mg  Depo-Medrol and 4 mL of Marcaine injected easily.  Completed without difficulty  Pain immediately resolved suggesting accurate placement of the medication.  Advised to call if fevers/chills, erythema, induration, drainage, or persistent bleeding.  Images permanently stored and available for review in the ultrasound unit.  Impression: Technically successful ultrasound guided injection.   No results found for this or any previous visit (from the past 24 hour(s)). No results found.  Impression and Recommendations:    Assessment and Plan: 46 y.o. female with Knee pain concerning for medial meniscus injury. Plan for trial of steroid injection today as well as oral meloxicam and topical diclofenac gel. Recheck in 3 weeks..   Orders Placed This Encounter  Procedures  . DG Knee Complete 4 Views Right    Please include patellar sunrise, lateral, and weightbearing bilateral AP and bilateral  Lutricia Feil  views    Standing Status:   Future    Number of Occurrences:   1    Standing Expiration Date:   03/07/2018    Order Specific Question:   Reason for exam:    Answer:   Please include patellar sunrise, lateral, and weightbearing bilateral AP and bilateral rosenberg views    Comments:   Please include patellar sunrise, lateral, and weightbearing bilateral AP and bilateral rosenberg views    Order Specific Question:   Preferred imaging location?    Answer:   [facility]  . DG Knee 1-2 Views Left    Standing Status:   Future    Number of Occurrences:   1    Standing Expiration Date:   03/08/2018    Order Specific Question:   Reason for Exam (SYMPTOM  OR DIAGNOSIS REQUIRED)    Answer:   For use with right knee x-ray, bilateral AP and Rosenberg standing.    Order Specific Question:   Is the patient pregnant?    Answer:   No    Order Specific Question:   Preferred imaging location?    Answer:   [facility]   No orders of the defined types were placed in this encounter.   Discussed warning signs or symptoms. Please see discharge instructions. Patient expresses understanding.

## 2017-01-26 ENCOUNTER — Ambulatory Visit: Payer: BLUE CROSS/BLUE SHIELD | Admitting: Family Medicine

## 2017-04-04 ENCOUNTER — Other Ambulatory Visit: Payer: Self-pay | Admitting: Family Medicine

## 2017-04-04 DIAGNOSIS — Z1239 Encounter for other screening for malignant neoplasm of breast: Secondary | ICD-10-CM

## 2017-04-05 ENCOUNTER — Ambulatory Visit (INDEPENDENT_AMBULATORY_CARE_PROVIDER_SITE_OTHER): Payer: BLUE CROSS/BLUE SHIELD | Admitting: Family Medicine

## 2017-04-05 ENCOUNTER — Encounter: Payer: Self-pay | Admitting: Family Medicine

## 2017-04-05 VITALS — BP 122/70 | HR 59 | Ht 62.0 in | Wt 199.0 lb

## 2017-04-05 DIAGNOSIS — G43509 Persistent migraine aura without cerebral infarction, not intractable, without status migrainosus: Secondary | ICD-10-CM

## 2017-04-05 MED ORDER — NYSTATIN 100000 UNIT/GM EX POWD: CUTANEOUS | 1 refills | Status: DC

## 2017-04-05 MED ORDER — TOPIRAMATE 25 MG PO CPSP: 25.0000 mg | ORAL_CAPSULE | Freq: Two times a day (BID) | ORAL | 0 refills | Status: DC

## 2017-04-05 MED ORDER — RIZATRIPTAN BENZOATE 10 MG PO TBDP: 10.0000 mg | ORAL_TABLET | ORAL | 3 refills | Status: DC | PRN

## 2017-04-05 NOTE — Patient Instructions (Addendum)
With the topiramate we will start with 25 mg twice a day.  After 1 week okay to increase to 2 tabs twice a day.  When you run out of the prescription let us know and we will send over new prescription for the 50 mg tabs so that you do not have to take so many.  Just call us and let us know or have the pharmacy call us. Keep a log or diary of how often you are having headaches.

## 2017-04-05 NOTE — Progress Notes (Signed)
Subjective:    Patient ID: Candace Greene, female    DOB: May 04, 1970, 46 y.o.   MRN: [id number]  HPI 46 yo female comes in today to f/u recent emergency department visit on December 4.  At that time she was having a migraine that was radiating down into her face and her neck.  And over at the time as well.  She was given Toradol, Decadron and Zofran via IV.  She was then discharged home with Fioricet and Flexeril.  She is not currently on prophylaxis for migraine headaches.  Over the last month or so she has had very frequent headaches on average about 3-4/week.  She says putting heat on her head and neck does seem to help.  The start in her head and the radiate down to her neck and throb at times.  She is sensitive to light and sound and sometimes will even get nauseated but has not vomited.  Will recently she is been experiencing blurry vision even without the headaches and has made an eye appointment which is scheduled for later today.  She said she went through an entire box of Tylenol Sinus without any significant relief.  Also been trying Excedrin Migraine with minimal relief as well. She says if she is not able to get rid of the headache it can last up to 3 days.  She says she occ will feel like she needs to close her right eye and it will sometimes even water before the headache starts.   Review of Systems  BP 122/70   Pulse (!) 59   Ht 5\' 2"  (1.575 m)   Wt 199 lb (90.3 kg)   SpO2 100%   BMI 36.40 kg/m     No Known Allergies  Past Medical History:  Diagnosis Date  . Heel spur    right  . Pulmonary embolus [facility])     Past Surgical History:  Procedure Laterality Date  . No prior surgery      Social History   Socioeconomic History  . Marital status: Married    Spouse name: Not on file  . Number of children: 2  . Years of education: Not on file  . Highest education level: Not on file  Social Needs  . Financial resource strain: Not on file  . Food insecurity - worry:  Not on file  . Food insecurity - inability: Not on file  . Transportation needs - medical: Not on file  . Transportation needs - non-medical: Not on file  Occupational History    Comment: [employer]  Tobacco Use  . Smoking status: Never Smoker  . Smokeless tobacco: Never Used  Substance and Sexual Activity  . Alcohol use: Yes    Alcohol/week: 0.0 oz    Comment: 3 glasses wine per night  . Drug use: No  . Sexual activity: Not on file  Other Topics Concern  . Not on file  Social History Narrative  . Not on file    Family History  Problem Relation Age of Onset  . Stroke Father        <60  . Heart attack Father   . Birth defects Other   . Hyperlipidemia Other   . Breast cancer Maternal Grandmother   . Breast cancer Maternal Aunt   . Cancer Unknown        GF    Outpatient Encounter Medications as of 04/05/2017  Medication Sig  . butalbital-acetaminophen-caffeine (FIORICET, ESGIC) 50-325-40 MG tablet Take by mouth.  Marland Kitchen  cyclobenzaprine (FLEXERIL) 5 MG tablet Take by mouth.  . diclofenac sodium (VOLTAREN) 1 % GEL Apply 4 g topically 4 (four) times daily. To affected joint.  . meloxicam (MOBIC) 15 MG tablet One tab PO qAM with breakfast for 2 weeks, then daily prn pain.  . rizatriptan (MAXALT-MLT) 10 MG disintegrating tablet Take 1 tablet (10 mg total) by mouth as needed for migraine. May repeat in 2 hours if needed  . topiramate (TOPAMAX) 25 MG capsule Take 1 capsule (25 mg total) by mouth 2 (two) times daily. After one week increase to 2 tabs po BID.   No facility-administered encounter medications on file as of 04/05/2017.          Objective:   Physical Exam  Constitutional: She is oriented to person, place, and time. She appears well-developed and well-nourished.  HENT:  Head: Normocephalic and atraumatic.  Right Ear: External ear normal.  Left Ear: External ear normal.  Nose: Nose normal.  Mouth/Throat: Oropharynx is clear and moist.  TMs and canals are clear.   Eyes:  Conjunctivae and EOM are normal. Pupils are equal, round, and reactive to light.  Neck: Neck supple. No thyromegaly present.  Cardiovascular: Normal rate, regular rhythm and normal heart sounds.  Pulmonary/Chest: Effort normal and breath sounds normal. She has no wheezes.  Lymphadenopathy:    She has no cervical adenopathy.  Neurological: She is alert and oriented to person, place, and time.  Skin: Skin is warm and dry.  Psychiatric: She has a normal mood and affect.          Assessment & Plan:  Migraine Headaches -New Dx. Headaches on most consistent with migraine headaches.  Though interestingly she does get some eye tearing which can be seen with cluster headaches.  Will put her on topiramate for prophylaxis as well as give her Relpax for rescue.  Follow-up in 8 weeks did encourage her to keep a diary of her headaches when we will see if they seem to be improving or not.  Work on getting adequate sleep, reducing stress, and avoiding foods that can sometimes trigger headaches.

## 2017-04-12 ENCOUNTER — Telehealth: Payer: Self-pay | Admitting: Family Medicine

## 2017-04-12 MED ORDER — AMOXICILLIN-POT CLAVULANATE 875-125 MG PO TABS: 1.0000 | ORAL_TABLET | Freq: Two times a day (BID) | ORAL | 0 refills | Status: DC

## 2017-04-12 NOTE — Telephone Encounter (Signed)
LM of MD instructions and that med sent to pharmacy. KG LPN

## 2017-04-12 NOTE — Telephone Encounter (Signed)
Pt calls and states seen last week for H/A and given a med but she thinks its more sinus infection now. Sxs are sinus drainage, face swelling, sneezing, eye drainage, H/A. Has tried Mucinex,Zicam and Zyrtec with no relief. Can you send her a med in to [facility] in [place].

## 2017-04-12 NOTE — Telephone Encounter (Signed)
OK, new RX sent.  Needs appt next week if not better.

## 2017-05-04 ENCOUNTER — Ambulatory Visit: Payer: BLUE CROSS/BLUE SHIELD

## 2017-05-08 ENCOUNTER — Encounter: Payer: Self-pay | Admitting: Physician Assistant

## 2017-05-08 ENCOUNTER — Ambulatory Visit (INDEPENDENT_AMBULATORY_CARE_PROVIDER_SITE_OTHER): Payer: BLUE CROSS/BLUE SHIELD | Admitting: Physician Assistant

## 2017-05-08 VITALS — BP 109/68 | HR 60 | Resp 16 | Wt 201.0 lb

## 2017-05-08 DIAGNOSIS — R1084 Generalized abdominal pain: Secondary | ICD-10-CM | POA: Diagnosis not present

## 2017-05-08 DIAGNOSIS — R198 Other specified symptoms and signs involving the digestive system and abdomen: Secondary | ICD-10-CM | POA: Diagnosis not present

## 2017-05-08 LAB — POCT URINALYSIS DIPSTICK
BILIRUBIN UA: NEGATIVE
Glucose, UA: NEGATIVE
Ketones, UA: NEGATIVE
LEUKOCYTES UA: NEGATIVE
Nitrite, UA: NEGATIVE
PH UA: 6 (ref 5.0–8.0)
Protein, UA: NEGATIVE
RBC UA: NEGATIVE
SPEC GRAV UA: 1.015 (ref 1.010–1.025)
UROBILINOGEN UA: 0.2 U/dL

## 2017-05-08 LAB — POCT URINE PREGNANCY: Preg Test, Ur: NEGATIVE

## 2017-05-08 NOTE — Patient Instructions (Addendum)
Go downstairs for imaging for your ultrasound Then proceed to the lab for blood work Stop Pepto and Ibuprofen Follow a strict low-fat diet  Low-Fat Diet for Pancreatitis or Gallbladder Conditions A low-fat diet can be helpful if you have pancreatitis or a gallbladder condition. With these conditions, your pancreas and gallbladder have trouble digesting fats. A healthy eating plan with less fat will help rest your pancreas and gallbladder and reduce your symptoms. What do I need to know about this diet?  Eat a low-fat diet. ? Reduce your fat intake to less than 20-30% of your total daily calories. This is less than 50-60 g of fat per day. ? Remember that you need some fat in your diet. Ask your dietician what your daily goal should be. ? Choose nonfat and low-fat healthy foods. Look for the words "nonfat," "low fat," or "fat free." ? As a guide, look on the label and choose foods with less than 3 g of fat per serving. Eat only one serving.  Avoid alcohol.  Do not smoke. If you need help quitting, talk with your health care provider.  Eat small frequent meals instead of three large heavy meals. What foods can I eat? Grains Include healthy grains and starches such as potatoes, wheat bread, fiber-rich cereal, and brown rice. Choose whole grain options whenever possible. In adults, whole grains should account for 45-65% of your daily calories. Fruits and Vegetables Eat plenty of fruits and vegetables. Fresh fruits and vegetables add fiber to your diet. Meats and Other Protein Sources Eat lean meat such as chicken and pork. Trim any fat off of meat before cooking it. Eggs, fish, and beans are other sources of protein. In adults, these foods should account for 10-35% of your daily calories. Dairy Choose low-fat milk and dairy options. Dairy includes fat and protein, as well as calcium. Fats and Oils Limit high-fat foods such as fried foods, sweets, baked goods, sugary drinks. Other Creamy  sauces and condiments, such as mayonnaise, can add extra fat. Think about whether or not you need to use them, or use smaller amounts or low fat options. What foods are not recommended?  High fat foods, such as: ? [employer]. ? Ice cream. ? [place] toast. ? Sweet rolls. ? Pizza. ? Cheese bread. ? Foods covered with batter, butter, creamy sauces, or cheese. ? Fried foods. ? Sugary drinks and desserts.  Foods that cause gas or bloating This information is not intended to replace advice given to you by your health care provider. Make sure you discuss any questions you have with your health care provider. Document Released: 04/16/2013 Document Revised: 09/17/2015 Document Reviewed: 03/25/2013 Elsevier Interactive Patient Education  2017 [employer].

## 2017-05-08 NOTE — Progress Notes (Signed)
HPI:                                                                Candace Greene is a 47 y.o. female who presents to [facility]: [facility] today for abdominal pain   Abdominal Pain  This is a recurrent problem. The current episode started in the past 7 days. The onset quality is undetermined. The problem occurs daily. The problem has been waxing and waning. The pain is located in the generalized abdominal region, left flank and right flank. The quality of the pain is cramping. The abdominal pain radiates to the back. Associated symptoms include flatus and frequency. Pertinent negatives include no anorexia, constipation, diarrhea, dysuria, fever, hematochezia, hematuria, melena, nausea, vomiting or weight loss. Associated symptoms comments: + diaphoresis. The pain is aggravated by eating (french fries and grilled cheese). The pain is relieved by certain positions. Treatments tried: NSAIDs, Pepto. The treatment provided mild relief. Prior diagnostic workup includes lower endoscopy (negative colonoscopy 2018). There is no history of abdominal surgery.  She has a history of rectal bleeding and was evaluated by [facility] in August 2018. Reports she had a normal colonoscopy.  Past Medical History:  Diagnosis Date  . Heel spur    right  . Pulmonary embolus [facility])    Past Surgical History:  Procedure Laterality Date  . No prior surgery     Social History   Tobacco Use  . Smoking status: Never Smoker  . Smokeless tobacco: Never Used  Substance Use Topics  . Alcohol use: Yes    Alcohol/week: 0.0 oz    Comment: 3 glasses wine per night   family history includes Birth defects in her other; Breast cancer in her maternal aunt and maternal grandmother; Cancer in her unknown relative; Heart attack in her father; Hyperlipidemia in her other; Stroke in her father.  Depression screen PHQ 2/9 06/27/2016  Decreased Interest 0  Down, Depressed, Hopeless 1   PHQ - 2 Score 1    No flowsheet data found.    ROS: negative except as noted in the HPI  Medications: Current Outpatient Medications  Medication Sig Dispense Refill  . butalbital-acetaminophen-caffeine (FIORICET, ESGIC) 50-325-40 MG tablet Take by mouth.    . cyclobenzaprine (FLEXERIL) 5 MG tablet Take by mouth.    . diclofenac sodium (VOLTAREN) 1 % GEL Apply 4 g topically 4 (four) times daily. To affected joint. 100 g 11  . meloxicam (MOBIC) 15 MG tablet One tab PO qAM with breakfast for 2 weeks, then daily prn pain. 30 tablet 3  . nystatin (NYSTATIN) powder 1 application under each breast BID 60 g 1  . rizatriptan (MAXALT-MLT) 10 MG disintegrating tablet Take 1 tablet (10 mg total) by mouth as needed for migraine. May repeat in 2 hours if needed 10 tablet 3  . topiramate (TOPAMAX) 25 MG capsule Take 1 capsule (25 mg total) by mouth 2 (two) times daily. After one week increase to 2 tabs po BID. 120 capsule 0   No current facility-administered medications for this visit.    No Known Allergies     Objective:  BP 109/68   Pulse 60   Resp 16   Wt 201 lb (91.2 kg)   LMP 03/27/2017 (Approximate)  BMI 36.76 kg/m  Gen:  alert, not ill-appearing, no distress, appropriate for age, obese female HEENT: head normocephalic without obvious abnormality, conjunctiva and cornea clear, oropharynx clear, moist mucous membranes, there is right tonsillar adenopathy, trachea midline Pulm: Normal work of breathing, normal phonation, clear to auscultation bilaterally, no wheezes, rales or rhonchi CV: Normal rate, regular rhythm, s1 and s2 distinct, no murmurs, clicks or rubs  GI: bowel sounds active, abdomen soft, non-distended, there is RUQ tenderness, positive Murphy's sign, no palpable masses, exam limited due to body habitus Neuro: alert and oriented x 3, no tremor Skin: intact, no rashes on exposed skin, no jaundice, no cyanosis Psych: well-groomed, cooperative, good eye contact, euthymic  mood, affect mood-congruent, speech is articulate, and thought processes clear and goal-directed    Results for orders placed or performed in visit on 05/08/17 (from the past 72 hour(s))  POCT urine pregnancy     Status: Normal   Collection Time: 05/08/17  2:23 PM  Result Value Ref Range   Preg Test, Ur Negative Negative  POCT Urinalysis Dipstick     Status: Normal   Collection Time: 05/08/17  2:24 PM  Result Value Ref Range   Color, UA yellow    Clarity, UA clear    Glucose, UA negative    Bilirubin, UA negative    Ketones, UA negative    Spec Grav, UA 1.015 1.010 - 1.025   Blood, UA negative    pH, UA 6.0 5.0 - 8.0   Protein, UA negative    Urobilinogen, UA 0.2 0.2 or 1.0 E.U./dL   Nitrite, UA negative    Leukocytes, UA Negative Negative   Appearance     Odor     No results found.    Assessment and Plan: 47 y.o. female with   1. Generalized abdominal pain - suspect gallbladder pathology based on history of pain brought on by fatty meals with positive Murphy's sign on exam. Ultrasound and labs today. Counseled on low-fat diet - POCT Urinalysis Dipstick negative - POCT urine pregnancy negative - US ABDOMEN LIMITED RUQ; Future - CBC with Differential/Platelet - Comprehensive metabolic panel - Lipase  2. Positive Murphy's Sign - US ABDOMEN LIMITED RUQ; Future  Patient education and anticipatory guidance given Patient agrees with treatment plan Follow-up as needed if symptoms worsen or fail to improve  Darlyne Russian PA-C

## 2017-05-08 NOTE — Progress Notes (Signed)
urina

## 2017-05-09 LAB — COMPREHENSIVE METABOLIC PANEL
AG Ratio: 1.3 (calc) (ref 1.0–2.5)
ALKALINE PHOSPHATASE (APISO): 41 U/L (ref 33–115)
ALT: 9 U/L (ref 6–29)
AST: 12 U/L (ref 10–35)
Albumin: 3.8 g/dL (ref 3.6–5.1)
BILIRUBIN TOTAL: 0.2 mg/dL (ref 0.2–1.2)
BUN/Creatinine Ratio: 10 (calc) (ref 6–22)
BUN: 11 mg/dL (ref 7–25)
CALCIUM: 9 mg/dL (ref 8.6–10.2)
CO2: 27 mmol/L (ref 20–32)
Chloride: 108 mmol/L (ref 98–110)
Creat: 1.11 mg/dL — ABNORMAL HIGH (ref 0.50–1.10)
Globulin: 2.9 g/dL (calc) (ref 1.9–3.7)
Glucose, Bld: 88 mg/dL (ref 65–99)
Potassium: 4.1 mmol/L (ref 3.5–5.3)
Sodium: 140 mmol/L (ref 135–146)
Total Protein: 6.7 g/dL (ref 6.1–8.1)

## 2017-05-09 LAB — CBC WITH DIFFERENTIAL/PLATELET
BASOS PCT: 0.6 %
Basophils Absolute: 31 cells/uL (ref 0–200)
EOS ABS: 82 {cells}/uL (ref 15–500)
Eosinophils Relative: 1.6 %
HEMATOCRIT: 35.9 % (ref 35.0–45.0)
Hemoglobin: 12 g/dL (ref 11.7–15.5)
LYMPHS ABS: 2290 {cells}/uL (ref 850–3900)
MCH: 31.8 pg (ref 27.0–33.0)
MCHC: 33.4 g/dL (ref 32.0–36.0)
MCV: 95.2 fL (ref 80.0–100.0)
MPV: 12.2 fL (ref 7.5–12.5)
Monocytes Relative: 9 %
NEUTROS PCT: 43.9 %
Neutro Abs: 2239 cells/uL (ref 1500–7800)
Platelets: 270 10*3/uL (ref 140–400)
RBC: 3.77 10*6/uL — ABNORMAL LOW (ref 3.80–5.10)
RDW: 13.3 % (ref 11.0–15.0)
TOTAL LYMPHOCYTE: 44.9 %
WBC: 5.1 10*3/uL (ref 3.8–10.8)
WBCMIX: 459 {cells}/uL (ref 200–950)

## 2017-05-09 LAB — LIPASE: LIPASE: 148 U/L — AB (ref 7–60)

## 2017-05-09 NOTE — Progress Notes (Signed)
Labs suggest possible pancreatitis. Will need the ultrasound to confirm. Recommend strict low-fat diet. If not tolerating, switch to a clear liquid diet.

## 2017-05-09 NOTE — Addendum Note (Signed)
Addended by: Nelson Chimes E on: 05/09/2017 01:04 PM   Modules accepted: Orders

## 2017-05-11 ENCOUNTER — Ambulatory Visit (INDEPENDENT_AMBULATORY_CARE_PROVIDER_SITE_OTHER): Payer: BLUE CROSS/BLUE SHIELD | Admitting: Family Medicine

## 2017-05-11 ENCOUNTER — Ambulatory Visit (INDEPENDENT_AMBULATORY_CARE_PROVIDER_SITE_OTHER): Payer: BLUE CROSS/BLUE SHIELD

## 2017-05-11 ENCOUNTER — Encounter: Payer: Self-pay | Admitting: Family Medicine

## 2017-05-11 VITALS — BP 119/61 | HR 52 | Ht 62.0 in | Wt 196.0 lb

## 2017-05-11 DIAGNOSIS — T148XXA Other injury of unspecified body region, initial encounter: Secondary | ICD-10-CM | POA: Diagnosis not present

## 2017-05-11 DIAGNOSIS — M25561 Pain in right knee: Secondary | ICD-10-CM

## 2017-05-11 DIAGNOSIS — G5621 Lesion of ulnar nerve, right upper limb: Secondary | ICD-10-CM | POA: Diagnosis not present

## 2017-05-11 DIAGNOSIS — G5601 Carpal tunnel syndrome, right upper limb: Secondary | ICD-10-CM | POA: Diagnosis not present

## 2017-05-11 DIAGNOSIS — K760 Fatty (change of) liver, not elsewhere classified: Secondary | ICD-10-CM | POA: Diagnosis not present

## 2017-05-11 DIAGNOSIS — G8929 Other chronic pain: Secondary | ICD-10-CM | POA: Diagnosis not present

## 2017-05-11 DIAGNOSIS — G5620 Lesion of ulnar nerve, unspecified upper limb: Secondary | ICD-10-CM | POA: Insufficient documentation

## 2017-05-11 DIAGNOSIS — R198 Other specified symptoms and signs involving the digestive system and abdomen: Secondary | ICD-10-CM

## 2017-05-11 DIAGNOSIS — G5600 Carpal tunnel syndrome, unspecified upper limb: Secondary | ICD-10-CM | POA: Insufficient documentation

## 2017-05-11 DIAGNOSIS — Z1239 Encounter for other screening for malignant neoplasm of breast: Secondary | ICD-10-CM

## 2017-05-11 DIAGNOSIS — R928 Other abnormal and inconclusive findings on diagnostic imaging of breast: Secondary | ICD-10-CM | POA: Diagnosis not present

## 2017-05-11 MED ORDER — MELOXICAM 15 MG PO TABS: ORAL_TABLET | ORAL | 3 refills | Status: DC

## 2017-05-11 NOTE — Progress Notes (Signed)
Candace Greene is a 47 y.o. female who presents to [facility] today for right knee pain, right arm numbness.  Right knee pain: Candace Greene has been seen previously for right knee pain most recently in September.  This is thought to be exacerbation of DJD versus a possible degenerative meniscus tear.  She denies any mechanical symptoms but notes pain and swelling.  She notes that the injection that she received in September worked until recently and she would like a repeat injection if possible.  Additionally she mentions her arm numbness.  She notes numbness into the ulnar and radial side of her hand specifically at night and with driving of her car.  She notes that she works in a [employer] which requires significant arm motion.  She is right-hand dominant.  Symptoms are moderate and she denies any current weakness.  He has not had much treatment for this issue yet.   Past Medical History:  Diagnosis Date  . Heel spur    right  . Pulmonary embolus [facility])    Past Surgical History:  Procedure Laterality Date  . No prior surgery     Social History   Tobacco Use  . Smoking status: Never Smoker  . Smokeless tobacco: Never Used  Substance Use Topics  . Alcohol use: Yes    Alcohol/week: 0.0 oz    Comment: 3 glasses wine per night     ROS:  As above   Medications: Current Outpatient Medications  Medication Sig Dispense Refill  . butalbital-acetaminophen-caffeine (FIORICET, ESGIC) 50-325-40 MG tablet Take by mouth.    . cyclobenzaprine (FLEXERIL) 5 MG tablet Take by mouth.    . diclofenac sodium (VOLTAREN) 1 % GEL Apply 4 g topically 4 (four) times daily. To affected joint. 100 g 11  . meloxicam (MOBIC) 15 MG tablet One tab PO qAM with breakfast for 2 weeks, then daily prn pain. 30 tablet 3  . nystatin (NYSTATIN) powder 1 application under each breast BID 60 g 1  . rizatriptan (MAXALT-MLT) 10 MG disintegrating tablet Take 1  tablet (10 mg total) by mouth as needed for migraine. May repeat in 2 hours if needed 10 tablet 3  . topiramate (TOPAMAX) 25 MG capsule Take 1 capsule (25 mg total) by mouth 2 (two) times daily. After one week increase to 2 tabs po BID. 120 capsule 0   No current facility-administered medications for this visit.    No Known Allergies   Exam:   BP 119/61   Pulse 52 Abnormal     Ht 5\' 2"  (1.575 m)   Wt 196 lb (88.9 kg)   SpO2 100%   BMI 35.85 kg/m   BSA 1.97 m  General: Well Developed, well nourished, and in no acute distress.  Neuro/Psych: Alert and oriented x3, extra-ocular muscles intact, able to move all 4 extremities, sensation grossly intact. Skin: Warm and dry, no rashes noted.  Respiratory: Not using accessory muscles, speaking in full sentences, trachea midline.  Cardiovascular: Pulses palpable, no extremity edema. Abdomen: Does not appear distended. MSK:  Right arm normal-appearing Right elbow normal-appearing normal range of motion. Positive Tinel's at cubital tunnel.  Paresthesias reproducible in the ulnar hand with elbow flexion longer than about 30 seconds  Right wrist normal-appearing nontender normal motion. Positive Tinel's test at the carpal tunnel.  Positive Phalen's test. Grip strength is intact.  Pulses capillary refill and sensation are intact  Right knee normal-appearing minimal effusion. Range of motion 0-120  degrees. Nontender. Stable ligamentous exam  Procedure: Real-time Ultrasound Guided Injection of right knee Device: GE Logiq E  Images permanently stored and available for review in the ultrasound unit. Verbal informed consent obtained. Discussed risks and benefits of procedure. Warned about infection bleeding damage to structures skin hypopigmentation and fat atrophy among others. Patient expresses understanding and agreement Time-out conducted.  Noted no overlying erythema, induration, or other signs of local infection.  Skin prepped in a  sterile fashion.  Local anesthesia: Topical Ethyl chloride.  With sterile technique and under real time ultrasound guidance:80 mg of Depo-Medrol and 4 mL of Marcaine injected easily.  Completed without difficulty  Pain immediately resolved suggesting accurate placement of the medication.  Advised to call if fevers/chills, erythema, induration, drainage, or persistent bleeding.  Images permanently stored and available for review in the ultrasound unit.  Impression: Technically successful ultrasound guided injection.    No results found for this or any previous visit (from the past 48 hour(s)). No results found.    Assessment and Plan: 47 y.o. female with  Right knee pain exacerbation of DJD versus degenerative meniscus tear.  Repeat steroid injection today.  Recheck as needed.  Continue NSAIDs as needed.  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  Discussed conservative management including night splinting both the elbow and the wrist.  Recheck if not improved.   No orders of the defined types were placed in this encounter.  No orders of the defined types were placed in this encounter.   Discussed warning signs or symptoms. Please see discharge instructions. Patient expresses understanding.

## 2017-05-11 NOTE — Patient Instructions (Signed)
Thank you for coming in today. Call or go to the ER if you develop a large red swollen joint with extreme pain or oozing puss.  Recheck as needed. Use the wrist brace at night and the elbow brace at night.   Cubital Tunnel Syndrome Cubital tunnel syndrome is a condition that causes pain and weakness of the forearm and hand. This condition happens when one of the nerves (ulnar nerve) that runs alongside the elbow joint becomes irritated. What are the causes? Causes of this condition include:  Increased pressure on the ulnar nerve at the elbow, arm, or forearm. This can be caused by: ? Swollen tissues. ? Ligaments. ? Muscles. ? Poorly healed elbow fractures. ? Tumors in the elbow. These are usually noncancerous (benign). ? Scar tissue that develops in the elbow after an injury. ? Bony growths (spurs) near the ulnar nerve.  Stretching of the nerve due to loose elbow ligaments.  Trauma to the nerve at the elbow.  Repetitive elbow bending.  Certain medical conditions.  What increases the risk? This condition is more likely to develop in:  People who do manual labor that requires frequently bending the elbow.  People who play sports that include repeated or strenuous throwing motions, such as baseball.  People who play contact sports, such as football or lacrosse.  People who do not warm up properly before activities.  People who have diabetes.  People who have an underactive thyroid (hypothyroidism).  What are the signs or symptoms? Symptoms of this condition include:  Clumsiness or weakness of the hand.  Tenderness of the inner elbow.  Aching or soreness of the inner elbow, forearm, or fingers, especially the little finger or the ring finger.  Increased pain with forced elbow bending.  Reduced control when throwing.  Tingling, numbness, or burning inside the forearm, or in part of the hand or fingers, especially the little finger or the ring finger.  Sharp pains  that shoot from the elbow down to the wrist and hand.  The inability to grip or pinch hard.  How is this diagnosed? This condition is diagnosed with a medical history and physical exam. Your health care provider will ask about your symptoms and ask for details about any injury. You may also have other tests, including:  Electromyogram (EMG). This test checks how well the nerve is working.  X-ray.  How is this treated? Treatment starts by stopping the activities that are causing your symptoms to get worse. Treatment may include the use of icing and medicines to reduce pain and swelling. You may also be advised to wear a splint to prevent your elbow from bending or wear an elbow pad where the ulnar nerve is closest to the skin. In less severe cases, treatment may also include working with a physical therapist:  To help decrease your symptoms.  To improve the strength and range of motion of your elbow, forearm, and hand.  If the treatments described above do not help, surgery may be needed. Follow these instructions at home: If you have a splint:  Wear it as told by your health care provider. Remove it only as told by your health care provider.  Loosen the splint if your fingers become numb and tingle, or if they turn cold and blue.  Keep the splint clean and dry. Managing pain, stiffness, and swelling  If directed, apply ice to the injured area: ? Put ice in a plastic bag. ? Place a towel between your skin and the bag. ?  Leave the ice on for 20 minutes, 2-3 times per day.  Move your fingers often to avoid stiffness and to lessen swelling.  Raise (elevate) the injured area above the level of your heart while you are sitting or lying down. General instructions  Take over-the-counter and prescription medicines only as told by your health care provider.  Keep all follow-up visits as told by your health care provider. This is important.  Do any exercise or physical therapy as told  by your health care provider.  Do not drive or operate heavy machinery while taking prescription pain medicine.  If you were given an elbow pad, wear it as told by your health care provider. Contact a health care provider if:  Your symptoms get worse.  Your symptoms do not get better with treatment.  Your have new pain.  Your hand on the injured side feels numb or cold. This information is not intended to replace advice given to you by your health care provider. Make sure you discuss any questions you have with your health care provider. Document Released: 04/11/2005 Document Revised: 09/17/2015 Document Reviewed: 06/18/2014 Elsevier Interactive Patient Education  2018 [place] Syndrome Carpal tunnel syndrome is a condition that causes pain in your hand and arm. The carpal tunnel is a narrow area that is on the palm side of your wrist. Repeated wrist motion or certain diseases may cause swelling in the tunnel. This swelling can pinch the main nerve in the wrist (median nerve). Follow these instructions at home: If you have a splint:  Wear it as told by your doctor. Remove it only as told by your doctor.  Loosen the splint if your fingers: ? Become numb and tingle. ? Turn blue and cold.  Keep the splint clean and dry. General instructions  Take over-the-counter and prescription medicines only as told by your doctor.  Rest your wrist from any activity that may be causing your pain. If needed, talk to your employer about changes that can be made in your work, such as getting a wrist pad to use while typing.  If directed, apply ice to the painful area: ? Put ice in a plastic bag. ? Place a towel between your skin and the bag. ? Leave the ice on for 20 minutes, 2-3 times per day.  Keep all follow-up visits as told by your doctor. This is important.  Do any exercises as told by your doctor, physical therapist, or occupational therapist. Contact a doctor  if:  You have new symptoms.  Medicine does not help your pain.  Your symptoms get worse. This information is not intended to replace advice given to you by your health care provider. Make sure you discuss any questions you have with your health care provider. Document Released: 03/31/2011 Document Revised: 09/17/2015 Document Reviewed: 08/27/2014 Elsevier Interactive Patient Education  [employer].

## 2017-05-11 NOTE — Progress Notes (Signed)
   Subjective:    Patient ID: Candace Greene, female    DOB: [birth year], 47 y.o.   MRN: [id number]  HPI 47 year old female with a prior history of migraine headaches comes in today complaining of right knee pain.  Her pain is lateral and radiates behind the knee.  It is worse if she is been standing for long period of time.  Better with rest.  Was seen by sports med and did an injection, given meloxicam and diclofenac gel. Pain is mostly anterior and radiates to the back.  [name] felt that her knee injury was most consistent with a medial meniscus injury.  He says her pain is not as bad as it was when she had the injection but she feels like that is starting to get worse and had in that direction and just did not want to get to that severity again.  Has a bruise on her left anterior thigh with no injury.  She just was concerned and wanted to make sure it was a blood clot.  She does not remember actually hitting or injuring the area.  Is just a little bit tender.  Review of Systems     Objective:   Physical Exam  Constitutional: She is oriented to person, place, and time. She appears well-developed and well-nourished.  HENT:  Head: Normocephalic and atraumatic.  Eyes: Conjunctivae and EOM are normal.  Cardiovascular: Normal rate.  Pulmonary/Chest: Effort normal.  Musculoskeletal:  Right knee with some trace swelling anteriorly.  Normal flexion and extension.  Some tenderness over the lateral joint line and some tenderness posteriorly.  No significant crepitus.  Strength is 5 out of 5 at the knee and the ankle.  No increased laxity with anterior drawer.  No significant discomfort with valgus or varus stress.  Neurological: She is alert and oriented to person, place, and time.  Skin: Skin is dry. No pallor.  She does have a fairly large bruise on the upper anterior thigh.  But it actually appears to be on the healing phase.  No surrounding erythema no induration.  Mildly tender on palpation.   Psychiatric: She has a normal mood and affect. Her behavior is normal.  Vitals reviewed.         Assessment & Plan:  Right knee pain - refill meloxicam.  I do think at this point she would be a candidate for another injection since is been at least 3 months since her last one.  Or we could consider physical therapy or rehab.  We will try to get her in with Dr. [name] today if possible for an injection.  Hematoma-she just has a hematoma on the right anterior thigh.  No sign of cellulitis or induration or infection.  Should heal well but if she has any problems with wound healing then please let us know.

## 2017-05-12 ENCOUNTER — Encounter: Payer: Self-pay | Admitting: Family Medicine

## 2017-05-12 ENCOUNTER — Other Ambulatory Visit: Payer: Self-pay | Admitting: Family Medicine

## 2017-05-12 DIAGNOSIS — R928 Other abnormal and inconclusive findings on diagnostic imaging of breast: Secondary | ICD-10-CM

## 2017-05-12 NOTE — Progress Notes (Signed)
Lipase level (pancreatic enzyme) is elevated and has been gradually increasing Abdominal ultrasound did not show any gallstones and the pancreas was not visualized Recommend follow-up imaging of the pancreas. I shared this info with Dr. Madilyn Fireman and she will order the appropriate imaging study In the meantime, recommend abstaining from alcohol and continue low-fat diet

## 2017-05-15 ENCOUNTER — Other Ambulatory Visit: Payer: Self-pay | Admitting: Family Medicine

## 2017-05-15 DIAGNOSIS — K8500 Idiopathic acute pancreatitis without necrosis or infection: Secondary | ICD-10-CM

## 2017-05-15 DIAGNOSIS — R748 Abnormal levels of other serum enzymes: Secondary | ICD-10-CM

## 2017-05-15 NOTE — Progress Notes (Signed)
c 

## 2017-05-16 ENCOUNTER — Other Ambulatory Visit: Payer: Self-pay | Admitting: Family Medicine

## 2017-05-16 ENCOUNTER — Ambulatory Visit
Admission: RE | Admit: 2017-05-16 | Discharge: 2017-05-16 | Disposition: A | Discharge status: Home / Self Care | Payer: BLUE CROSS/BLUE SHIELD | Source: Ambulatory Visit | Attending: Family Medicine | Admitting: Family Medicine

## 2017-05-16 DIAGNOSIS — R928 Other abnormal and inconclusive findings on diagnostic imaging of breast: Secondary | ICD-10-CM

## 2017-05-16 DIAGNOSIS — N6320 Unspecified lump in the left breast, unspecified quadrant: Secondary | ICD-10-CM

## 2017-05-26 ENCOUNTER — Other Ambulatory Visit: Payer: BLUE CROSS/BLUE SHIELD

## 2017-05-29 ENCOUNTER — Ambulatory Visit (INDEPENDENT_AMBULATORY_CARE_PROVIDER_SITE_OTHER): Payer: BLUE CROSS/BLUE SHIELD

## 2017-05-29 ENCOUNTER — Other Ambulatory Visit: Payer: BLUE CROSS/BLUE SHIELD

## 2017-05-29 DIAGNOSIS — R748 Abnormal levels of other serum enzymes: Secondary | ICD-10-CM | POA: Diagnosis not present

## 2017-05-29 DIAGNOSIS — K8500 Idiopathic acute pancreatitis without necrosis or infection: Secondary | ICD-10-CM

## 2017-05-29 DIAGNOSIS — R1011 Right upper quadrant pain: Secondary | ICD-10-CM | POA: Diagnosis not present

## 2017-05-29 MED ORDER — IOPAMIDOL (ISOVUE-300) INJECTION 61%
100.0000 mL | Freq: Once | INTRAVENOUS | Status: AC | PRN
  Administered 2017-05-29: 100 mL via INTRAVENOUS

## 2017-05-31 ENCOUNTER — Telehealth: Payer: Self-pay | Admitting: Family Medicine

## 2017-05-31 NOTE — Telephone Encounter (Signed)
Patient called regarding imaging results adv Candace Greene lvm/ results but read her the results and she is still having pain. She said on Saturday while out shopping the pain came and she had to stop and stand still for a while until it ease away but still having it. What like a call back to discuss the next step would be to see why she is having this pain. Thanks

## 2017-05-31 NOTE — Telephone Encounter (Signed)
lvm informing pt of recommendation. Asked that she rtn call w/who she was seen by before and we can send referral.Gunther Zawadzki, [name], CMA

## 2017-06-07 ENCOUNTER — Encounter: Payer: Self-pay | Admitting: Family Medicine

## 2017-06-07 ENCOUNTER — Ambulatory Visit (INDEPENDENT_AMBULATORY_CARE_PROVIDER_SITE_OTHER): Payer: BLUE CROSS/BLUE SHIELD | Admitting: Family Medicine

## 2017-06-07 VITALS — BP 119/56 | HR 59 | Ht 62.0 in | Wt 196.0 lb

## 2017-06-07 DIAGNOSIS — G43109 Migraine with aura, not intractable, without status migrainosus: Secondary | ICD-10-CM

## 2017-06-07 DIAGNOSIS — R1084 Generalized abdominal pain: Secondary | ICD-10-CM | POA: Diagnosis not present

## 2017-06-07 DIAGNOSIS — J019 Acute sinusitis, unspecified: Secondary | ICD-10-CM | POA: Diagnosis not present

## 2017-06-07 MED ORDER — AZITHROMYCIN 250 MG PO TABS: ORAL_TABLET | ORAL | 0 refills | Status: AC

## 2017-06-07 MED ORDER — TOPIRAMATE 50 MG PO TABS: 50.0000 mg | ORAL_TABLET | Freq: Two times a day (BID) | ORAL | 1 refills | Status: DC

## 2017-06-07 NOTE — Progress Notes (Signed)
Subjective:    Patient ID: Candace Greene, female    DOB: 1971/01/28, 47 y.o.   MRN: [id number]  HPI 47 year old female comes in today complaining of 1 week of sinus pressure facial pain drainage.  She is been taking sinus allergy medicine, Zyrtec and Mucinex.  She also feels like she has a migraine today.  A lot of the facial pressure is over the nasal bridge and underneath both eyes.  She is been sneezing a lot.  Denies any significant cough.  No nausea vomiting or diarrhea.  She has had some chills and sweats.  Follow-up migraine headaches-she is doing really well on the Topamax and is happy with her current regimen.  She really does not want to adjust her dose at this point in time.    She was still having the intermittent abdominal pain scan.  I was concerned about possibility of pancreatitis because her pancreatic enzymes were elevated.  Essentially the CT was normal.  She is continued to have pain so we offered to refer her to GI.  Review of Systems BP (!) 119/56   Pulse (!) 59   Ht 5\' 2"  (1.575 m)   Wt 196 lb (88.9 kg)   LMP 05/19/2017   SpO2 99%   BMI 35.85 kg/m     No Known Allergies  Past Medical History:  Diagnosis Date  . Heel spur    right  . Pulmonary embolus [facility])     Past Surgical History:  Procedure Laterality Date  . No prior surgery      Social History   Socioeconomic History  . Marital status: Married    Spouse name: Not on file  . Number of children: 2  . Years of education: Not on file  . Highest education level: Not on file  Social Needs  . Financial resource strain: Not on file  . Food insecurity - worry: Not on file  . Food insecurity - inability: Not on file  . Transportation needs - medical: Not on file  . Transportation needs - non-medical: Not on file  Occupational History    Comment: [employer]  Tobacco Use  . Smoking status: Never Smoker  . Smokeless tobacco: Never Used  Substance and Sexual Activity  . Alcohol use: Yes   Alcohol/week: 0.0 oz    Comment: 3 glasses wine per night  . Drug use: No  . Sexual activity: Not on file  Other Topics Concern  . Not on file  Social History Narrative  . Not on file    Family History  Problem Relation Age of Onset  . Stroke Father        <60  . Heart attack Father   . Birth defects Other   . Hyperlipidemia Other   . Breast cancer Maternal Grandmother   . Breast cancer Maternal Aunt   . Cancer Unknown        GF    Outpatient Encounter Medications as of 06/07/2017  Medication Sig  . butalbital-acetaminophen-caffeine (FIORICET, ESGIC) 50-325-40 MG tablet Take by mouth.  . cyclobenzaprine (FLEXERIL) 5 MG tablet Take by mouth.  . diclofenac sodium (VOLTAREN) 1 % GEL Apply 4 g topically 4 (four) times daily. To affected joint.  . meloxicam (MOBIC) 15 MG tablet One tab PO qAM with breakfast for 2 weeks, then daily prn pain.  Marland Kitchen nystatin (NYSTATIN) powder 1 application under each breast BID  . rizatriptan (MAXALT-MLT) 10 MG disintegrating tablet Take 1 tablet (10 mg total) by mouth as  needed for migraine. May repeat in 2 hours if needed  . [DISCONTINUED] topiramate (TOPAMAX) 25 MG capsule Take 1 capsule (25 mg total) by mouth 2 (two) times daily. After one week increase to 2 tabs po BID.  Marland Kitchen azithromycin (ZITHROMAX) 250 MG tablet 2 Ttabs PO on Day 1, then one a day x 4 days.  Marland Kitchen topiramate (TOPAMAX) 50 MG tablet Take 1 tablet (50 mg total) by mouth 2 (two) times daily.   No facility-administered encounter medications on file as of 06/07/2017.          Objective:   Physical Exam  Constitutional: She is oriented to person, place, and time. She appears well-developed and well-nourished.  HENT:  Head: Normocephalic and atraumatic.  Right Ear: External ear normal.  Left Ear: External ear normal.  Nose: Nose normal.  Mouth/Throat: Oropharynx is clear and moist.  TMs and canals are clear.   Eyes: Conjunctivae and EOM are normal. Pupils are equal, round, and reactive  to light.  Neck: Neck supple. No thyromegaly present.  Cardiovascular: Normal rate, regular rhythm and normal heart sounds.  Pulmonary/Chest: Effort normal and breath sounds normal. She has no wheezes.  Lymphadenopathy:    She has no cervical adenopathy.  Neurological: She is alert and oriented to person, place, and time.  Skin: Skin is warm and dry.  Psychiatric: She has a normal mood and affect.        Assessment & Plan:  Acute sinusitis-We will treat with azithromycin since she just had a course of Augmentin about a month ago in January.  Encouraged her to continue to use her nasal steroid spray.  Migraine HA - doing well on low dose of topamax, 50 mg twice a day..  Continue current regimen.  Follow-up in 2 months.  Abdominal pain-negative CT.  At this point we can certainly refer to GI if she prefers.  She can let us know which she would like to do.

## 2017-06-09 ENCOUNTER — Ambulatory Visit: Payer: BLUE CROSS/BLUE SHIELD | Admitting: Family Medicine

## 2017-08-03 IMAGING — US US PELVIS COMPLETE
1 series · 14 of 25 positions shown · non-contrast
Comparison: 06/15/2015

CLINICAL DATA: Vaginal bleeding for 3 weeks fall initial encounter,
history of fibroid disease



[Series 1: us pelvis complete · 0.20mm/px · 14 of 75 slices shown]
[im 1/75]
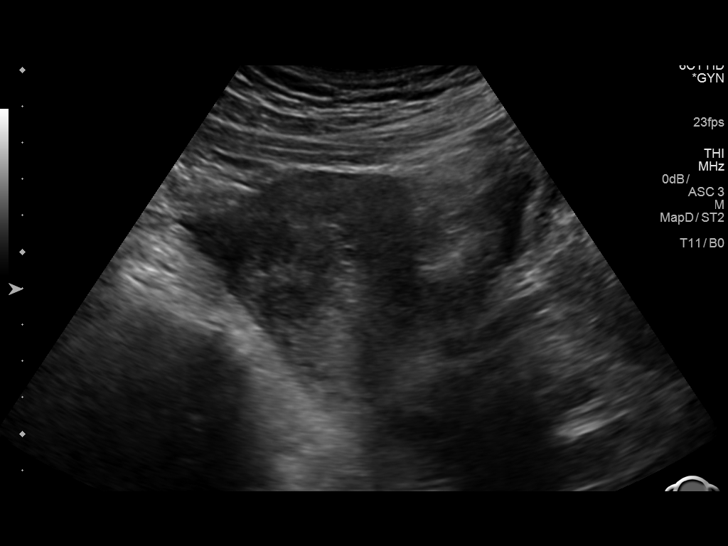
[im 7/75]
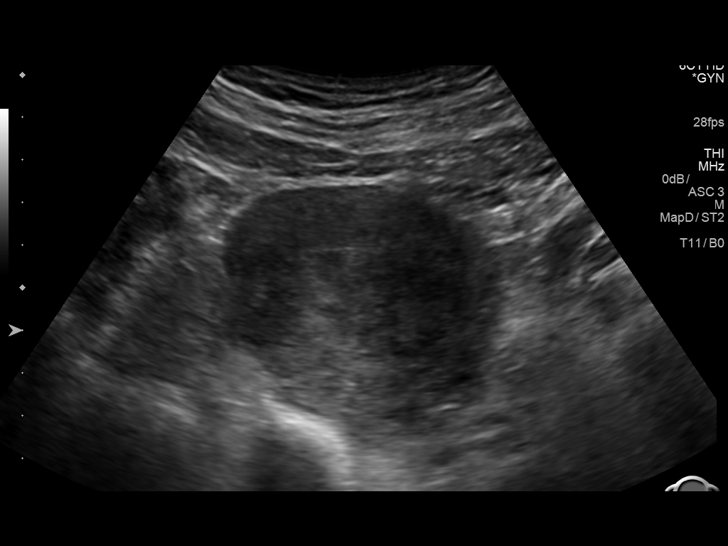
[im 13/75]
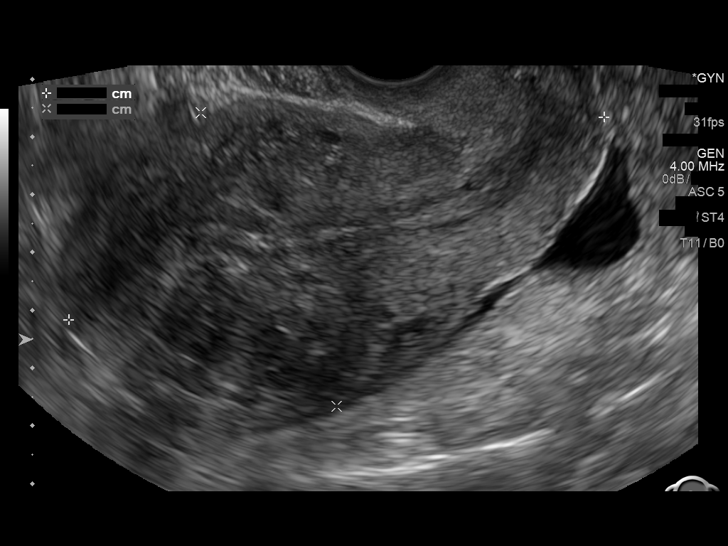
[im 19/75]
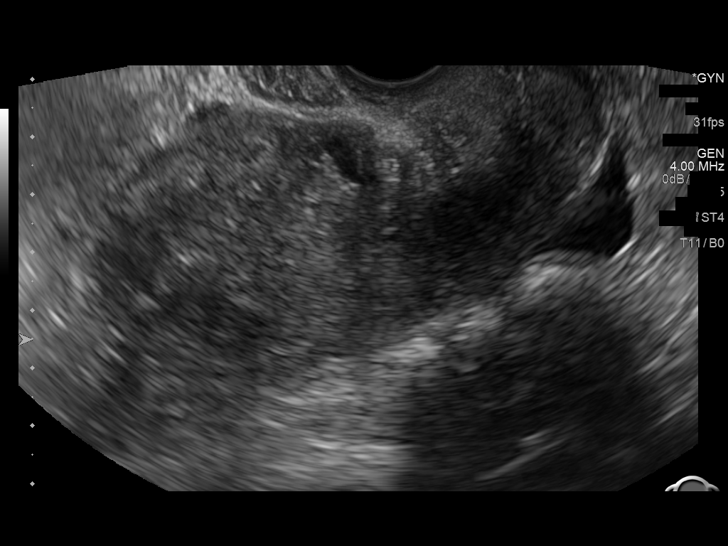
[im 25/75]
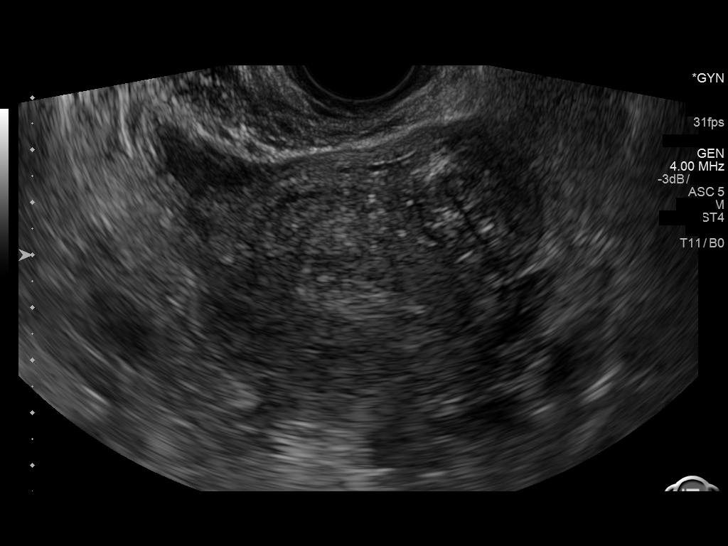
[im 28/75]
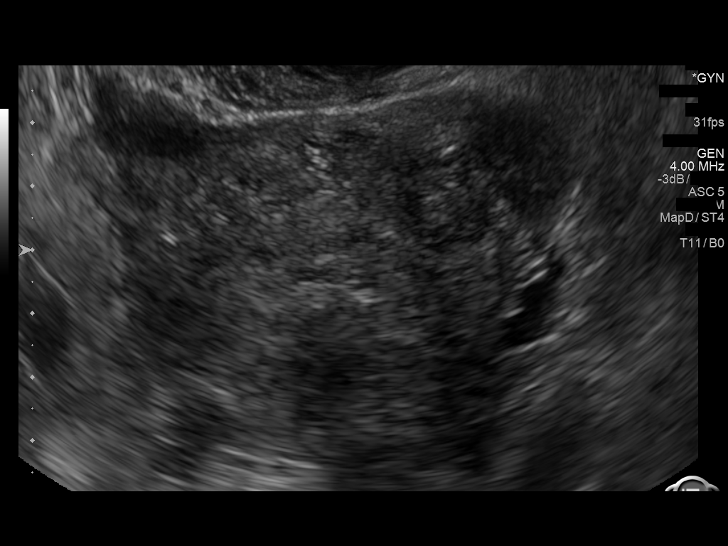
[im 34/75]
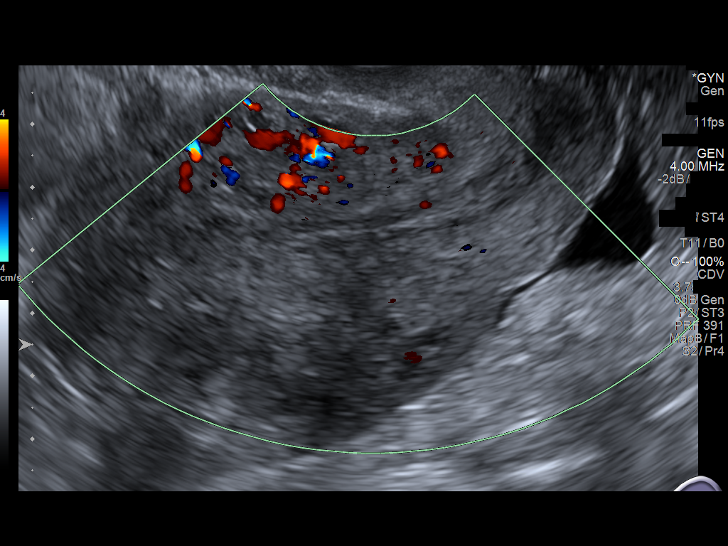
[im 41/75]
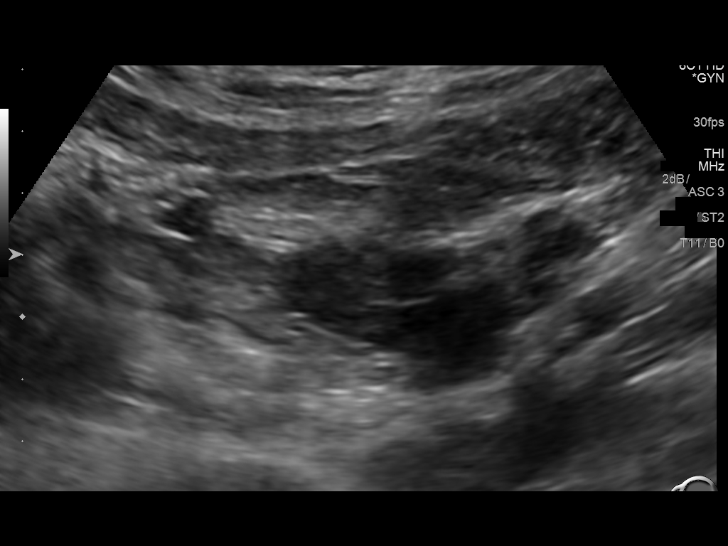
[im 47/75]
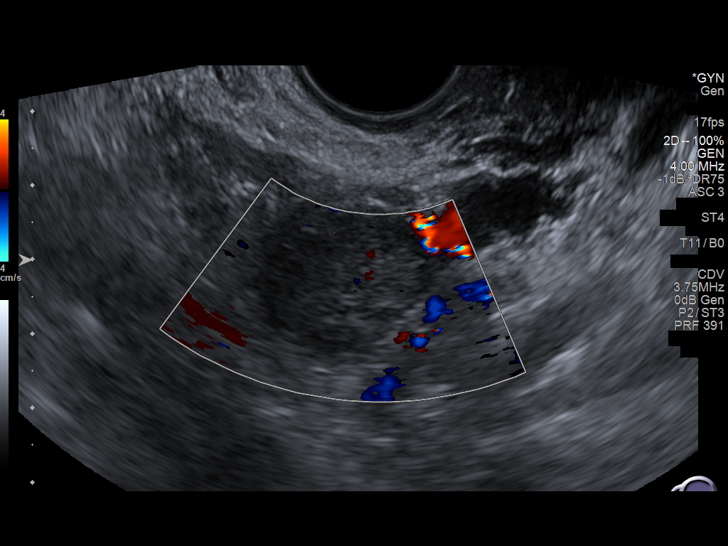
[im 50/75]
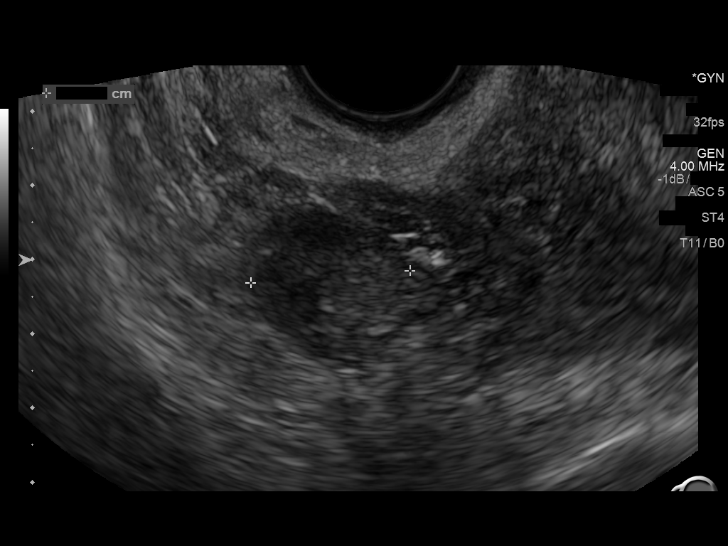
[im 56/75]
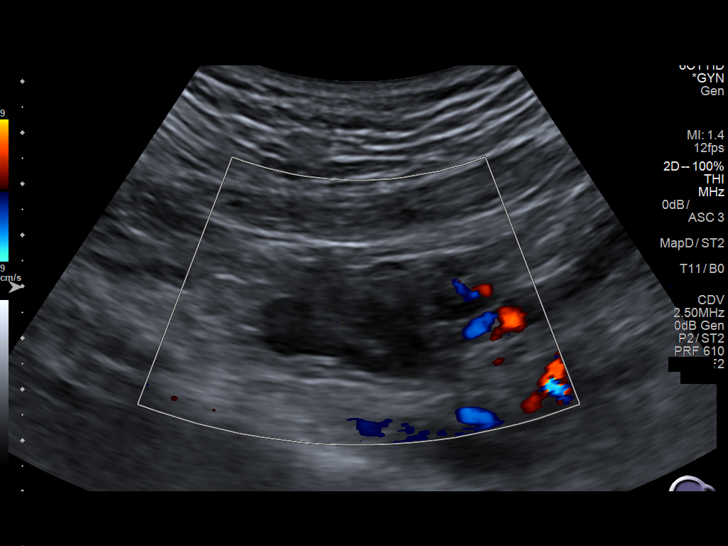
[im 62/75]
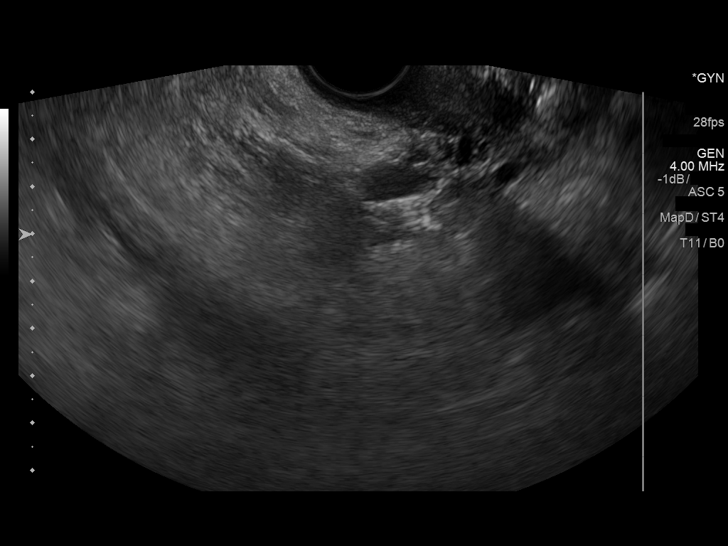
[im 68/75]
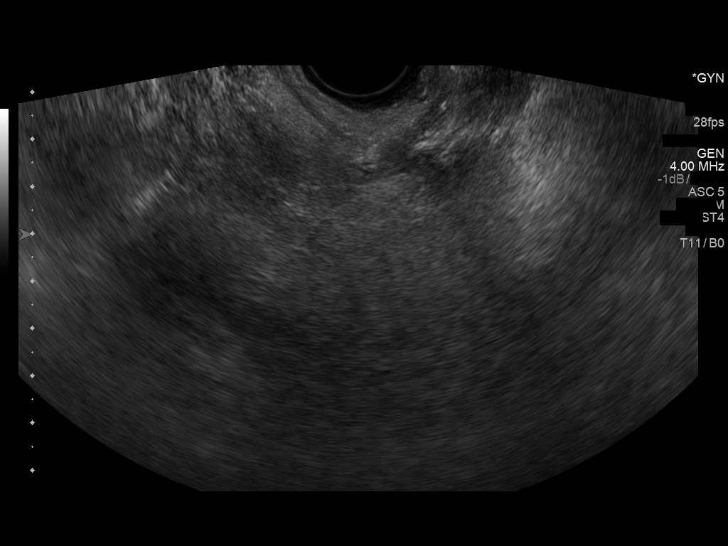
[im 75/75]
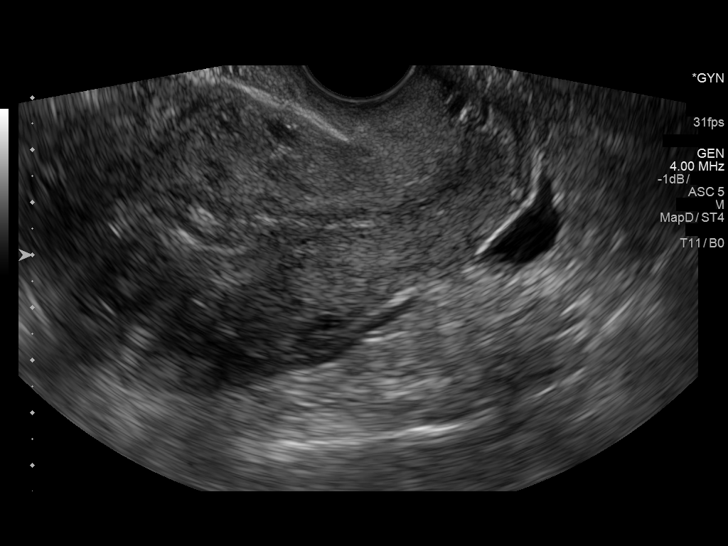

[14 of 25 positions shown; findings below may reference images not displayed]

FINDINGS: Uterus

Measurements: 9.9 x 5.6 x 6.3 cm.. A 4.2 cm fibroid is noted
posteriorly. Diffuse heterogeneity of the myometrium is noted

Endometrium

Thickness: 9 mm.  No focal abnormality visualized.

Right ovary

Measurements: 2.6 x 1.7 x 2.2 cm.. Normal appearance/no adnexal
mass.

Left ovary

Measurements: 4.5 x 2.3 x 3.8 cm.. Normal appearance/no adnexal
mass.

Other findings

No abnormal free fluid.
IMPRESSION: Changes consistent with uterine fibroids. No other focal abnormality
is noted.

## 2017-08-04 ENCOUNTER — Ambulatory Visit: Payer: BLUE CROSS/BLUE SHIELD | Admitting: Family Medicine

## 2017-09-12 ENCOUNTER — Encounter: Payer: Self-pay | Admitting: Physician Assistant

## 2017-09-12 ENCOUNTER — Ambulatory Visit (INDEPENDENT_AMBULATORY_CARE_PROVIDER_SITE_OTHER): Payer: BLUE CROSS/BLUE SHIELD | Admitting: Physician Assistant

## 2017-09-12 VITALS — BP 120/78 | HR 75 | Temp 98.3°F | Wt 192.0 lb

## 2017-09-12 DIAGNOSIS — J189 Pneumonia, unspecified organism: Secondary | ICD-10-CM | POA: Insufficient documentation

## 2017-09-12 DIAGNOSIS — J181 Lobar pneumonia, unspecified organism: Secondary | ICD-10-CM

## 2017-09-12 MED ORDER — HYDROCOD POLST-CPM POLST ER 10-8 MG/5ML PO SUER: 5.0000 mL | Freq: Two times a day (BID) | ORAL | 0 refills | Status: AC | PRN

## 2017-09-12 MED ORDER — PREDNISONE 50 MG PO TABS: 50.0000 mg | ORAL_TABLET | Freq: Every day | ORAL | 0 refills | Status: DC

## 2017-09-12 NOTE — Progress Notes (Signed)
HPI:                                                                Candace Greene is a 47 y.o. female who presents to [facility]: [place] today for hospital follow-up  Diagnosed with LLL pneumonia at [facility] ED on 09/10/17 Prescribed Z-pak and Tessalon pearls. She is on day 3 of the Z-pak and symptoms are unchanged. Endorses occasionally productive cough, sometimes coughing to the point of emesis. Endorses a "rattle in her chest." Endorses malaise, diaphoresis, chills, dyspnea with exertion. Denies fever, chest pain, hemoptysis.   Depression screen PHQ 2/9 06/27/2016  Decreased Interest 0  Down, Depressed, Hopeless 1  PHQ - 2 Score 1    No flowsheet data found.    Past Medical History:  Diagnosis Date  . Heel spur    right  . Pulmonary embolus [facility])    Past Surgical History:  Procedure Laterality Date  . No prior surgery     Social History   Tobacco Use  . Smoking status: Never Smoker  . Smokeless tobacco: Never Used  Substance Use Topics  . Alcohol use: Yes    Alcohol/week: 0.0 oz    Comment: 3 glasses wine per night   family history includes Birth defects in her other; Breast cancer in her maternal aunt and maternal grandmother; Cancer in her unknown relative; Heart attack in her father; Hyperlipidemia in her other; Stroke in her father.    ROS: negative except as noted in the HPI  Medications: Current Outpatient Medications  Medication Sig Dispense Refill  . azithromycin (ZITHROMAX Z-PAK) 250 MG tablet Take 2 tablets po for day 1 then 1 tablet po each day for 4 days    . benzonatate (TESSALON) 100 MG capsule Take by mouth.    . butalbital-acetaminophen-caffeine (FIORICET, ESGIC) 50-325-40 MG tablet Take by mouth.    . cyclobenzaprine (FLEXERIL) 5 MG tablet Take by mouth.    . diclofenac sodium (VOLTAREN) 1 % GEL Apply 4 g topically 4 (four) times daily. To affected joint. 100 g 11  . meloxicam (MOBIC) 15 MG tablet One  tab PO qAM with breakfast for 2 weeks, then daily prn pain. 30 tablet 3  . nystatin (NYSTATIN) powder 1 application under each breast BID 60 g 1  . rizatriptan (MAXALT-MLT) 10 MG disintegrating tablet Take 1 tablet (10 mg total) by mouth as needed for migraine. May repeat in 2 hours if needed 10 tablet 3  . topiramate (TOPAMAX) 50 MG tablet Take 1 tablet (50 mg total) by mouth 2 (two) times daily. 180 tablet 1   No current facility-administered medications for this visit.    No Known Allergies     Objective:  BP 120/78   Pulse 75   Temp 98.3 F (36.8 C) (Oral)   Wt 192 lb (87.1 kg)   SpO2 97%   BMI 35.12 kg/m  Gen:  alert, not ill-appearing, no distress, appropriate for age HEENT: head normocephalic without obvious abnormality, conjunctiva and cornea clear, trachea midline Pulm: Normal work of breathing, normal phonation, left lower lung fields are coarse CV: Normal rate, regular rhythm, s1 and s2 distinct, no murmurs, clicks or rubs  Neuro: alert and oriented x 3, no tremor MSK: extremities  atraumatic, normal gait and station Skin: intact, no rashes on exposed skin, no jaundice, no cyanosis    Acute Interface, Incoming Rad Results - 09/10/2017  1:55 PM EDT COMPARISON:  None INDICATION: Cough   TECHNIQUE:  XR CHEST AP PORTABLE - Exam date/time:  09/10/2017 1:30 PM  FINDINGS:  #  Left lung base infiltrate. Right lung clear. No pulmonary edema. No pneumothorax or pleural effusion. The heart is not enlarged. No hilar or mediastinal abnormality.   IMPRESSION: 1.  Mild left lung base infiltrate  Electronically Signed by: Sharin Mons No results found for this or any previous visit (from the past 72 hour(s)). No results found.    Assessment and Plan: 47 y.o. female with   Pneumonia of left lower lobe due to infectious organism ([place]) - Plan: chlorpheniramine-HYDROcodone (TUSSIONEX) 10-8 MG/5ML SUER, predniSONE (DELTASONE) 50 MG tablet SpO2 97% on RA at rest, afebrile,  no tachypnea or tachycardia. Personally reviewed outside CXR report from 09/10/17 Adding prednisone for wheezing/dyspnea. Tussionex for nocturnal cough Continue Azithromycin and Mucinex If no improvement in 48 hours, will add Moxifloxacin   Patient education and anticipatory guidance given Patient agrees with treatment plan Follow-up as needed if symptoms worsen or fail to improve  Darlyne Russian PA-C

## 2017-09-12 NOTE — Patient Instructions (Addendum)
- continue your Azithromycin antibiotic and Mucinex - start Prednisone daily with food - use Tussionex at bedtime for cough - if no improvement in 48 hours, call our office  Community-Acquired Pneumonia, Adult Pneumonia is an infection of the lungs. There are different types of pneumonia. One type can develop while a person is in a hospital. A different type, called community-acquired pneumonia, develops in people who are not, or have not recently been, in the hospital or other health care facility. What are the causes? Pneumonia may be caused by bacteria, viruses, or funguses. Community-acquired pneumonia is often caused by Streptococcus pneumonia bacteria. These bacteria are often passed from one person to another by breathing in droplets from the cough or sneeze of an infected person. What increases the risk? The condition is more likely to develop in:  People who havechronic diseases, such as chronic obstructive pulmonary disease (COPD), asthma, congestive heart failure, cystic fibrosis, diabetes, or kidney disease.  People who haveearly-stage or late-stage HIV.  People who havesickle cell disease.  People who havehad their spleen removed (splenectomy).  People who havepoor Human resources officer.  People who havemedical conditions that increase the risk of breathing in (aspirating) secretions their own mouth and nose.  People who havea weakened immune system (immunocompromised).  People who smoke.  People whotravel to areas where pneumonia-causing germs commonly exist.  People whoare around animal habitats or animals that have pneumonia-causing germs, including birds, bats, rabbits, cats, and farm animals.  What are the signs or symptoms? Symptoms of this condition include:  Adry cough.  A wet (productive) cough.  Fever.  Sweating.  Chest pain, especially when breathing deeply or coughing.  Rapid breathing or difficulty breathing.  Shortness of breath.  Shaking  chills.  Fatigue.  Muscle aches.  How is this diagnosed? Your health care provider will take a medical history and perform a physical exam. You may also have other tests, including:  Imaging studies of your chest, including X-rays.  Tests to check your blood oxygen level and other blood gases.  Other tests on blood, mucus (sputum), fluid around your lungs (pleural fluid), and urine.  If your pneumonia is severe, other tests may be done to identify the specific cause of your illness. How is this treated? The type of treatment that you receive depends on many factors, such as the cause of your pneumonia, the medicines you take, and other medical conditions that you have. For most adults, treatment and recovery from pneumonia may occur at home. In some cases, treatment must happen in a hospital. Treatment may include:  Antibiotic medicines, if the pneumonia was caused by bacteria.  Antiviral medicines, if the pneumonia was caused by a virus.  Medicines that are given by mouth or through an IV tube.  Oxygen.  Respiratory therapy.  Although rare, treating severe pneumonia may include:  Mechanical ventilation. This is done if you are not breathing well on your own and you cannot maintain a safe blood oxygen level.  Thoracentesis. This procedureremoves fluid around one lung or both lungs to help you breathe better.  Follow these instructions at home:  Take over-the-counter and prescription medicines only as told by your health care provider. ? Only takecough medicine if you are losing sleep. Understand that cough medicine can prevent your body's natural ability to remove mucus from your lungs. ? If you were prescribed an antibiotic medicine, take it as told by your health care provider. Do not stop taking the antibiotic even if you start to feel better.  Sleep in a semi-upright position at night. Try sleeping in a reclining chair, or place a few pillows under your head.  Do not  use tobacco products, including cigarettes, chewing tobacco, and e-cigarettes. If you need help quitting, ask your health care provider.  Drink enough water to keep your urine clear or pale yellow. This will help to thin out mucus secretions in your lungs. How is this prevented? There are ways that you can decrease your risk of developing community-acquired pneumonia. Consider getting a pneumococcal vaccine if:  You are older than 47 years of age.  You are older than 47 years of age and are undergoing cancer treatment, have chronic lung disease, or have other medical conditions that affect your immune system. Ask your health care provider if this applies to you.  There are different types and schedules of pneumococcal vaccines. Ask your health care provider which vaccination option is best for you. You may also prevent community-acquired pneumonia if you take these actions:  Get an influenza vaccine every year. Ask your health care provider which type of influenza vaccine is best for you.  Go to the dentist on a regular basis.  Wash your hands often. Use hand sanitizer if soap and water are not available.  Contact a health care provider if:  You have a fever.  You are losing sleep because you cannot control your cough with cough medicine. Get help right away if:  You have worsening shortness of breath.  You have increased chest pain.  Your sickness becomes worse, especially if you are an older adult or have a weakened immune system.  You cough up blood. This information is not intended to replace advice given to you by your health care provider. Make sure you discuss any questions you have with your health care provider. Document Released: 04/11/2005 Document Revised: 08/20/2015 Document Reviewed: 08/06/2014 Elsevier Interactive Patient Education  [employer].

## 2017-10-17 ENCOUNTER — Telehealth: Payer: Self-pay

## 2017-10-17 DIAGNOSIS — N921 Excessive and frequent menstruation with irregular cycle: Secondary | ICD-10-CM

## 2017-10-17 NOTE — Telephone Encounter (Signed)
Pt called today wondering about a referral she thought was going to be placed to GYN.  Pt reports some lower right side pain that she explains as being close to her vagina. Pt has menstruated from 09-28-17 to 10-05-17, then spotted for 2-3 days last week, and spotted again Saturday, Sunday, and today.   Pt states all her SX are the same as the ones she has had in the past, she just thought she was going to be referred to a GYN for these issues.   Pt concerned and frustrated with the frequency and unpredictability of menstruating/spotting.   Please advise

## 2017-10-17 NOTE — Telephone Encounter (Signed)
OK, new referrral placed.

## 2017-10-18 NOTE — Telephone Encounter (Signed)
Left VM for Pt with status update.  

## 2017-10-31 ENCOUNTER — Ambulatory Visit (INDEPENDENT_AMBULATORY_CARE_PROVIDER_SITE_OTHER): Payer: BLUE CROSS/BLUE SHIELD | Admitting: Physician Assistant

## 2017-10-31 ENCOUNTER — Encounter: Payer: Self-pay | Admitting: Physician Assistant

## 2017-10-31 VITALS — BP 130/80 | HR 60 | Temp 98.3°F | Ht 62.01 in | Wt 198.0 lb

## 2017-10-31 DIAGNOSIS — J0140 Acute pansinusitis, unspecified: Secondary | ICD-10-CM

## 2017-10-31 DIAGNOSIS — R05 Cough: Secondary | ICD-10-CM | POA: Diagnosis not present

## 2017-10-31 DIAGNOSIS — J040 Acute laryngitis: Secondary | ICD-10-CM | POA: Diagnosis not present

## 2017-10-31 DIAGNOSIS — R059 Cough, unspecified: Secondary | ICD-10-CM

## 2017-10-31 MED ORDER — HYDROCODONE-HOMATROPINE 5-1.5 MG/5ML PO SYRP: 5.0000 mL | ORAL_SOLUTION | Freq: Every evening | ORAL | 0 refills | Status: DC | PRN

## 2017-10-31 MED ORDER — BENZONATATE 200 MG PO CAPS
200.0000 mg | ORAL_CAPSULE | Freq: Two times a day (BID) | ORAL | 0 refills | Status: DC | PRN
Start: 2017-10-31 — End: 2018-01-11

## 2017-10-31 MED ORDER — AMOXICILLIN-POT CLAVULANATE 875-125 MG PO TABS: 1.0000 | ORAL_TABLET | Freq: Two times a day (BID) | ORAL | 0 refills | Status: AC

## 2017-10-31 MED ORDER — PREDNISONE 50 MG PO TABS
50.0000 mg | ORAL_TABLET | Freq: Every day | ORAL | 0 refills | Status: DC
Start: 2017-10-31 — End: 2018-01-11

## 2017-10-31 NOTE — Patient Instructions (Signed)
Laryngitis Laryngitis is inflammation of your vocal cords. This causes hoarseness, coughing, loss of voice, sore throat, or a dry throat. Your vocal cords are two bands of muscles that are found in your throat. When you speak, these cords come together and vibrate. These vibrations come out through your mouth as sound. When your vocal cords are inflamed, your voice sounds different. Laryngitis can be temporary (acute) or long-term (chronic). Most cases of acute laryngitis improve with time. Chronic laryngitis is laryngitis that lasts for more than three weeks. What are the causes? Acute laryngitis may be caused by:  A viral infection.  Lots of talking, yelling, or singing. This is also called vocal strain.  Bacterial infections.  Chronic laryngitis may be caused by:  Vocal strain.  Injury to your vocal cords.  Acid reflux (gastroesophageal reflux disease or GERD).  Allergies.  Sinus infection.  Smoking.  Alcohol abuse.  Breathing in chemicals or dust.  Growths on the vocal cords.  What increases the risk? Risk factors for laryngitis include:  Smoking.  Alcohol abuse.  Having allergies.  What are the signs or symptoms? Symptoms of laryngitis may include:  Low, hoarse voice.  Loss of voice.  Dry cough.  Sore throat.  Stuffy nose.  How is this diagnosed? Laryngitis may be diagnosed by:  Physical exam.  Throat culture.  Blood test.  Laryngoscopy. This procedure allows your health care provider to look at your vocal cords with a mirror or viewing tube.  How is this treated? Treatment for laryngitis depends on what is causing it. Usually, treatment involves resting your voice and using medicines to soothe your throat. However, if your laryngitis is caused by a bacterial infection, you may need to take antibiotic medicine. If your laryngitis is caused by a growth, you may need to have a procedure to remove it. Follow these instructions at home:  Drink  enough fluid to keep your urine clear or pale yellow.  Breathe in moist air. Use a humidifier if you live in a dry climate.  Take medicines only as directed by your health care provider.  If you were prescribed an antibiotic medicine, finish it all even if you start to feel better.  Do not smoke cigarettes or electronic cigarettes. If you need help quitting, ask your health care provider.  Talk as little as possible. Also avoid whispering, which can cause vocal strain.  Write instead of talking. Do this until your voice is back to normal. Contact a health care provider if:  You have a fever.  You have increasing pain.  You have difficulty swallowing. Get help right away if:  You cough up blood.  You have trouble breathing. This information is not intended to replace advice given to you by your health care provider. Make sure you discuss any questions you have with your health care provider. Document Released: 04/11/2005 Document Revised: 09/17/2015 Document Reviewed: 09/24/2013 Elsevier Interactive Patient Education  2018 Elsevier Inc.  

## 2017-10-31 NOTE — Progress Notes (Signed)
Subjective:    Patient ID: Candace Greene, female    DOB: January 06, 1971, 47 y.o.   MRN: [id number]  HPI  Pt is a 47 yo female with hx of pneumonia and PE who presents to the clinic ST, cough, hoarse voice, left sided chest wall pain that has progressed over the past week. She went to the ED on 10/28/17. She was concerned due to pneumonia on 09/12/17. CXR on 7/6 confirmed no acute findings. Rapid strep was negative.  Symptomatic care was given for URI and norco for left chest wall pain. Her throat continues to be sore. She has completely lost her voice. Cough is keeping her up at night.   .. Active Ambulatory Problems    Diagnosis Date Noted  . Wrist tendonitis 11/28/2014  . History of pulmonary embolism 04/17/2015  . Abnormal echocardiogram 05/06/2015  . Elevated lipase 06/15/2015  . Generalized abdominal pain 06/15/2015  . Ovarian cyst 06/16/2015  . Uterine fibroid 06/16/2015  . Pica 09/01/2015  . DUB (dysfunctional uterine bleeding) 09/01/2015  . Iron deficiency anemia 09/01/2015  . Menorrhagia with irregular cycle 09/01/2015  . Fatty liver 11/04/2015  . Right knee pain 06/27/2016  . Carpal tunnel syndrome 05/11/2017  . Cubital tunnel syndrome 05/11/2017  . Pneumonia of left lower lobe due to infectious organism [facility]) 09/12/2017   Resolved Ambulatory Problems    Diagnosis Date Noted  . KNEE PAIN 03/16/2010  . TOE PAIN 03/16/2010  . Irregular bleeding 11/28/2014  . Acute pulmonary embolism ([place]) 04/17/2015  . Chest pain 05/06/2015  . Atypical chest pain 06/10/2015  . Epigastric abdominal tenderness on direct palpation 06/10/2015  . Abdominal guarding 06/15/2015  . Fibroid, uterine 09/01/2015  . Right foot pain 08/09/2016  . Positive Murphy's Sign 05/08/2017   Past Medical History:  Diagnosis Date  . Heel spur   . Pulmonary embolus ([place])       Review of Systems  All other systems reviewed and are negative.      Objective:   Physical Exam  Constitutional: She is  oriented to person, place, and time. She appears well-developed and well-nourished.  HENT:  Head: Normocephalic and atraumatic.  Right Ear: Tympanic membrane and ear canal normal. No drainage or tenderness.  Left Ear: Tympanic membrane and ear canal normal. No drainage or tenderness.  Mouth/Throat: Uvula is midline and mucous membranes are normal. Uvula swelling present. No oropharyngeal exudate, posterior oropharyngeal edema, posterior oropharyngeal erythema or tonsillar abscesses. Tonsils are 1+ on the right. Tonsils are 1+ on the left.  Tenderness over maxillary and frontal sinuses to palpation.   Eyes: EOM are normal.  Neck: Normal range of motion. Neck supple.  Cardiovascular: Normal rate, regular rhythm and normal heart sounds.  Pulmonary/Chest: Effort normal and breath sounds normal.  Lymphadenopathy:    She has cervical adenopathy.  Neurological: She is alert and oriented to person, place, and time.  Psychiatric: She has a normal mood and affect. Her behavior is normal.          Assessment & Plan:  .Marland KitchenJosette was seen today for sore throat and cough.  Diagnoses and all orders for this visit:  Acute non-recurrent pansinusitis -     amoxicillin-clavulanate (AUGMENTIN) 875-125 MG tablet; Take 1 tablet by mouth 2 (two) times daily for 10 days.  Laryngitis -     predniSONE (DELTASONE) 50 MG tablet; Take 1 tablet (50 mg total) by mouth daily.  Cough -     benzonatate (TESSALON) 200 MG capsule; Take 1 capsule (  200 mg total) by mouth 2 (two) times daily as needed for cough. -     HYDROcodone-homatropine (HYCODAN) 5-1.5 MG/5ML syrup; Take 5 mLs by mouth at bedtime as needed.   Symptoms present for almost a week. augmentin for 10 days, prednisone burst, tessalon during the day and hycodan at bedtime.  controlled substance database reviewed with no concerns. No significant SOB to suggest any PE. Discussed warning signs to follow up if things change.

## 2017-11-17 ENCOUNTER — Other Ambulatory Visit: Payer: Self-pay | Admitting: Family Medicine

## 2017-11-17 ENCOUNTER — Ambulatory Visit
Admission: RE | Admit: 2017-11-17 | Discharge: 2017-11-17 | Disposition: A | Discharge status: Home / Self Care | Payer: BLUE CROSS/BLUE SHIELD | Source: Ambulatory Visit | Attending: Family Medicine | Admitting: Family Medicine

## 2017-11-17 DIAGNOSIS — N6320 Unspecified lump in the left breast, unspecified quadrant: Secondary | ICD-10-CM

## 2017-11-21 ENCOUNTER — Telehealth: Payer: Self-pay | Admitting: Family Medicine

## 2017-11-21 NOTE — Telephone Encounter (Signed)
Please review images, thank you.

## 2017-11-21 NOTE — Telephone Encounter (Signed)
Patient was seen at the breast center, the radiologist there should have spoken with the patient at the time of that visit about the follow-up plan.  Based on their notation:  "Bilateral diagnostic mammogram and LEFT breast ultrasound recommended in 6 months.  I have discussed the findings and recommendations with the patient. Results were also provided in writing at the conclusion of the visit. If applicable, a reminder letter will be sent to the patient regarding the next appointment."  She has additional questions, I would have her reach out to the breast center or have a follow-up visit with her PCP.

## 2017-11-21 NOTE — Telephone Encounter (Signed)
Pt called because she states she had an abnormal Mammo and was wondering her next steps. Please advise patient

## 2017-11-22 NOTE — Telephone Encounter (Signed)
Left VM for Pt to return clinic call regarding results, callback information provided. 

## 2018-01-11 ENCOUNTER — Ambulatory Visit (INDEPENDENT_AMBULATORY_CARE_PROVIDER_SITE_OTHER): Payer: BLUE CROSS/BLUE SHIELD

## 2018-01-11 ENCOUNTER — Encounter: Payer: Self-pay | Admitting: Family Medicine

## 2018-01-11 ENCOUNTER — Ambulatory Visit (INDEPENDENT_AMBULATORY_CARE_PROVIDER_SITE_OTHER): Payer: BLUE CROSS/BLUE SHIELD | Admitting: Family Medicine

## 2018-01-11 VITALS — BP 139/65 | HR 55 | Temp 98.1°F | Ht 62.0 in | Wt 193.0 lb

## 2018-01-11 DIAGNOSIS — R109 Unspecified abdominal pain: Secondary | ICD-10-CM

## 2018-01-11 DIAGNOSIS — R110 Nausea: Secondary | ICD-10-CM

## 2018-01-11 DIAGNOSIS — Z23 Encounter for immunization: Secondary | ICD-10-CM | POA: Diagnosis not present

## 2018-01-11 DIAGNOSIS — K5900 Constipation, unspecified: Secondary | ICD-10-CM

## 2018-01-11 DIAGNOSIS — Z86711 Personal history of pulmonary embolism: Secondary | ICD-10-CM

## 2018-01-11 DIAGNOSIS — R1032 Left lower quadrant pain: Secondary | ICD-10-CM

## 2018-01-11 LAB — CBC WITH DIFFERENTIAL/PLATELET
BASOS PCT: 0.8 %
Basophils Absolute: 30 cells/uL (ref 0–200)
EOS ABS: 68 {cells}/uL (ref 15–500)
Eosinophils Relative: 1.8 %
HCT: 34.4 % — ABNORMAL LOW (ref 35.0–45.0)
HEMOGLOBIN: 11.9 g/dL (ref 11.7–15.5)
Lymphs Abs: 1919 cells/uL (ref 850–3900)
MCH: 32.4 pg (ref 27.0–33.0)
MCHC: 34.6 g/dL (ref 32.0–36.0)
MCV: 93.7 fL (ref 80.0–100.0)
MPV: 11.8 fL (ref 7.5–12.5)
Monocytes Relative: 7.6 %
NEUTROS ABS: 1493 {cells}/uL — AB (ref 1500–7800)
Neutrophils Relative %: 39.3 %
Platelets: 289 10*3/uL (ref 140–400)
RBC: 3.67 10*6/uL — ABNORMAL LOW (ref 3.80–5.10)
RDW: 13.3 % (ref 11.0–15.0)
Total Lymphocyte: 50.5 %
WBC: 3.8 10*3/uL (ref 3.8–10.8)
WBCMIX: 289 {cells}/uL (ref 200–950)

## 2018-01-11 LAB — COMPLETE METABOLIC PANEL WITH GFR
AG RATIO: 1.3 (calc) (ref 1.0–2.5)
ALBUMIN MSPROF: 4 g/dL (ref 3.6–5.1)
ALT: 10 U/L (ref 6–29)
AST: 18 U/L (ref 10–35)
Alkaline phosphatase (APISO): 38 U/L (ref 33–115)
BILIRUBIN TOTAL: 0.5 mg/dL (ref 0.2–1.2)
BUN: 11 mg/dL (ref 7–25)
CHLORIDE: 107 mmol/L (ref 98–110)
CO2: 25 mmol/L (ref 20–32)
Calcium: 9.2 mg/dL (ref 8.6–10.2)
Creat: 1.04 mg/dL (ref 0.50–1.10)
GFR, Est African American: 75 mL/min/{1.73_m2} (ref >=60)
GFR, Est Non African American: 64 mL/min/{1.73_m2} (ref >=60)
Globulin: 3.1 g/dL (calc) (ref 1.9–3.7)
Glucose, Bld: 92 mg/dL (ref 65–99)
POTASSIUM: 4 mmol/L (ref 3.5–5.3)
Sodium: 139 mmol/L (ref 135–146)
TOTAL PROTEIN: 7.1 g/dL (ref 6.1–8.1)

## 2018-01-11 LAB — POCT URINALYSIS DIPSTICK
Bilirubin, UA: NEGATIVE
Glucose, UA: NEGATIVE
KETONES UA: NEGATIVE
Leukocytes, UA: NEGATIVE
NITRITE UA: NEGATIVE
PROTEIN UA: NEGATIVE
Spec Grav, UA: 1.015 (ref 1.010–1.025)
Urobilinogen, UA: 1 E.U./dL
pH, UA: 7.5 (ref 5.0–8.0)

## 2018-01-11 LAB — LIPASE: Lipase: 67 U/L — ABNORMAL HIGH (ref 7–60)

## 2018-01-11 LAB — POCT URINE PREGNANCY: PREG TEST UR: NEGATIVE

## 2018-01-11 IMAGING — DX DG WRIST COMPLETE 3+V*R*
4 series · 4 of 4 positions shown · non-contrast
Comparison: None.

CLINICAL DATA: Pain after trauma

EXAM:
RIGHT WRIST - COMPLETE 3+ VIEW

[wrist pa]
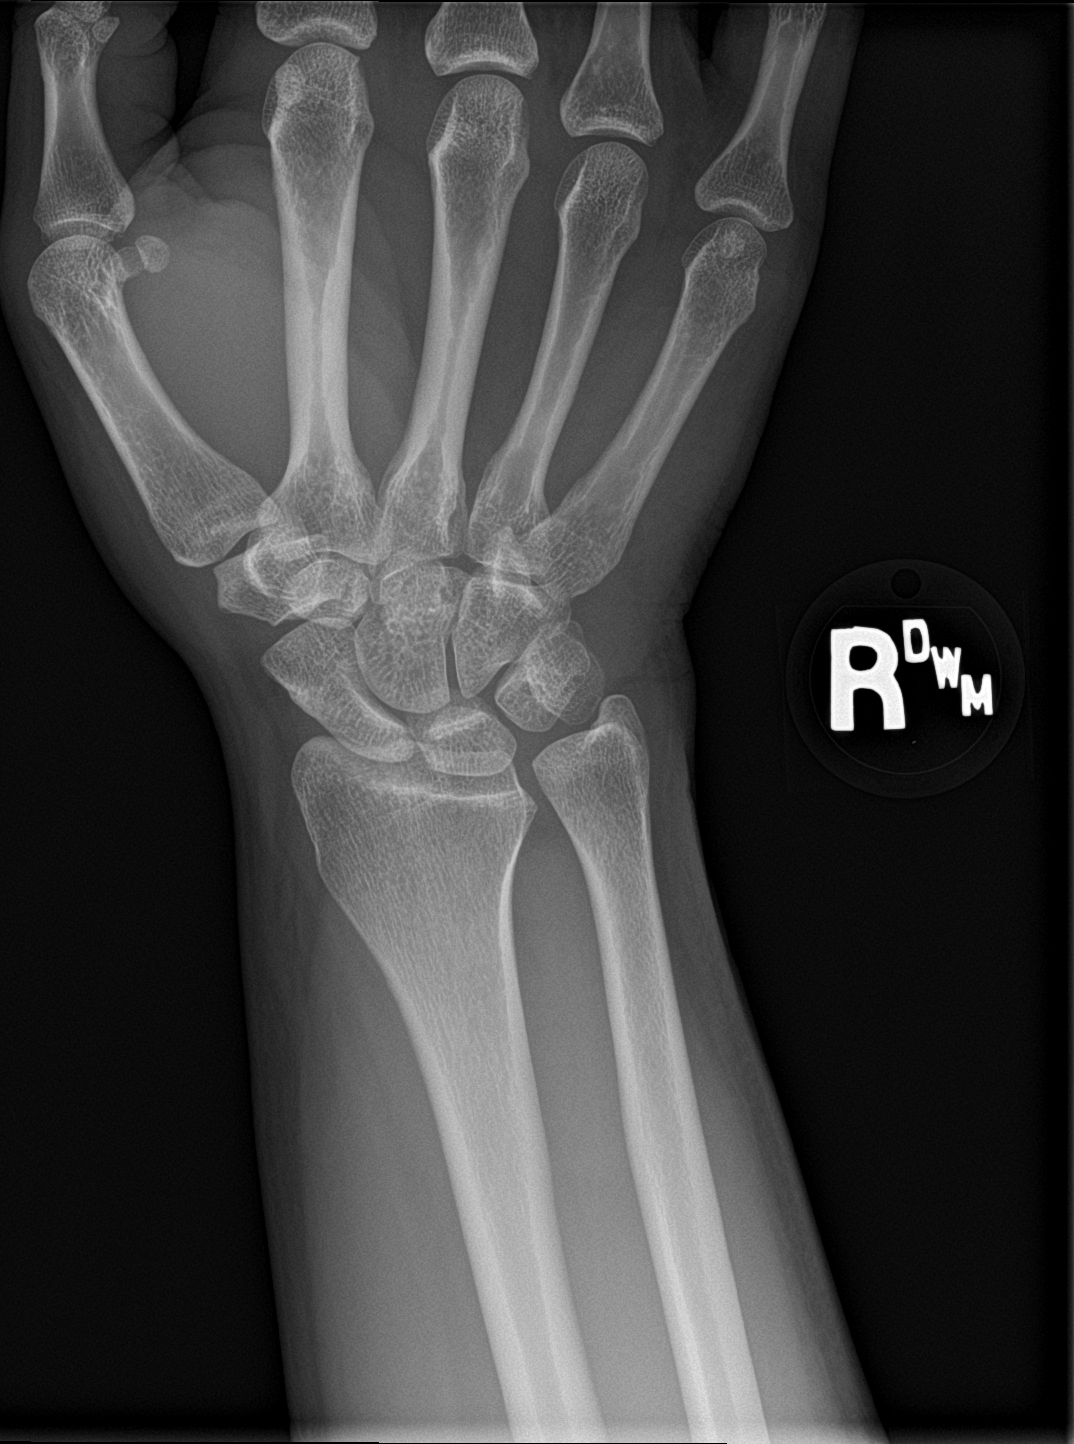

[wrist obl]
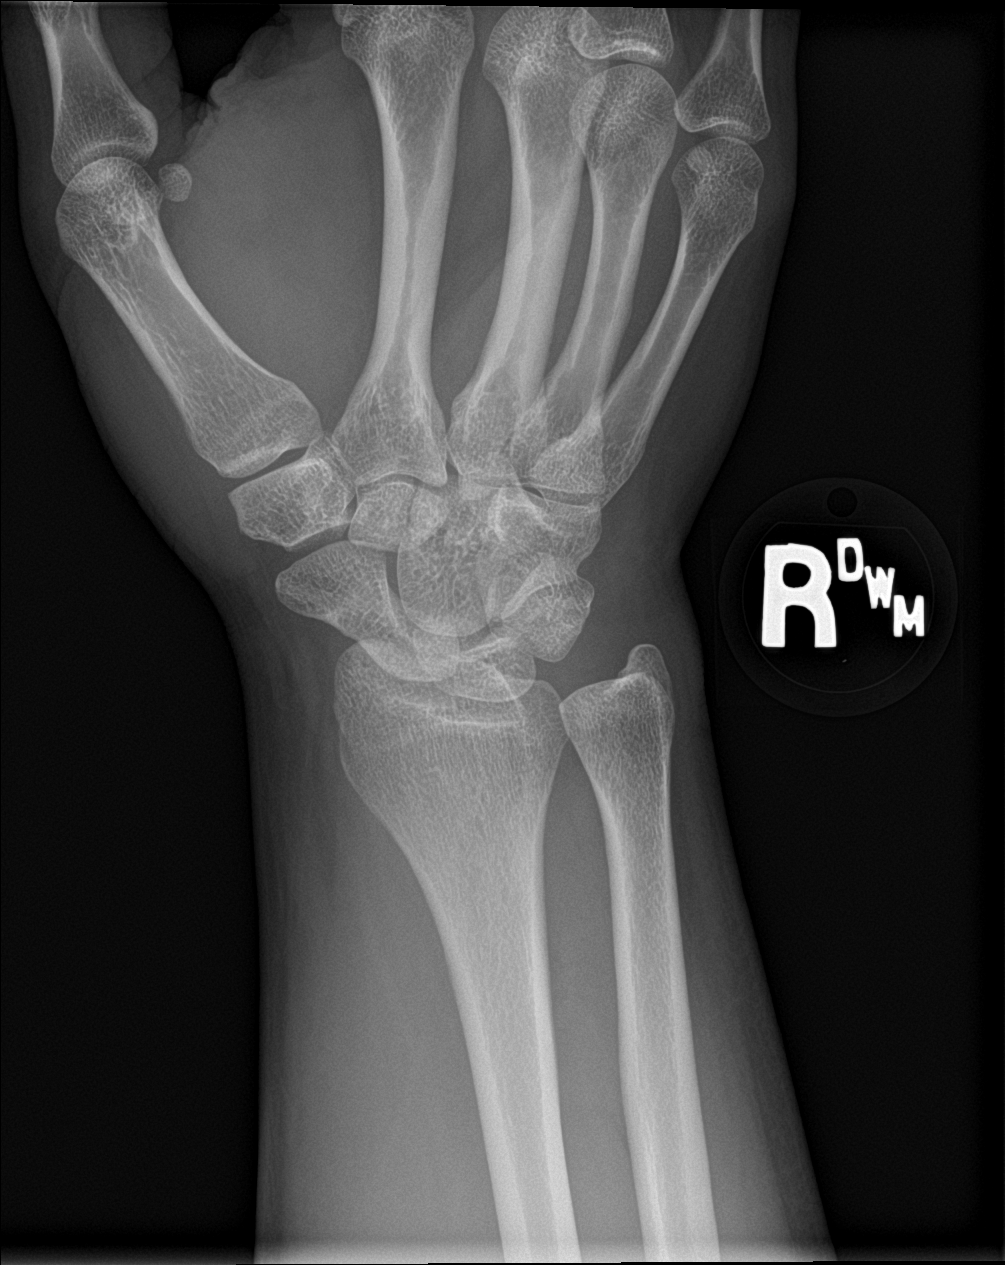

[wrist lat]
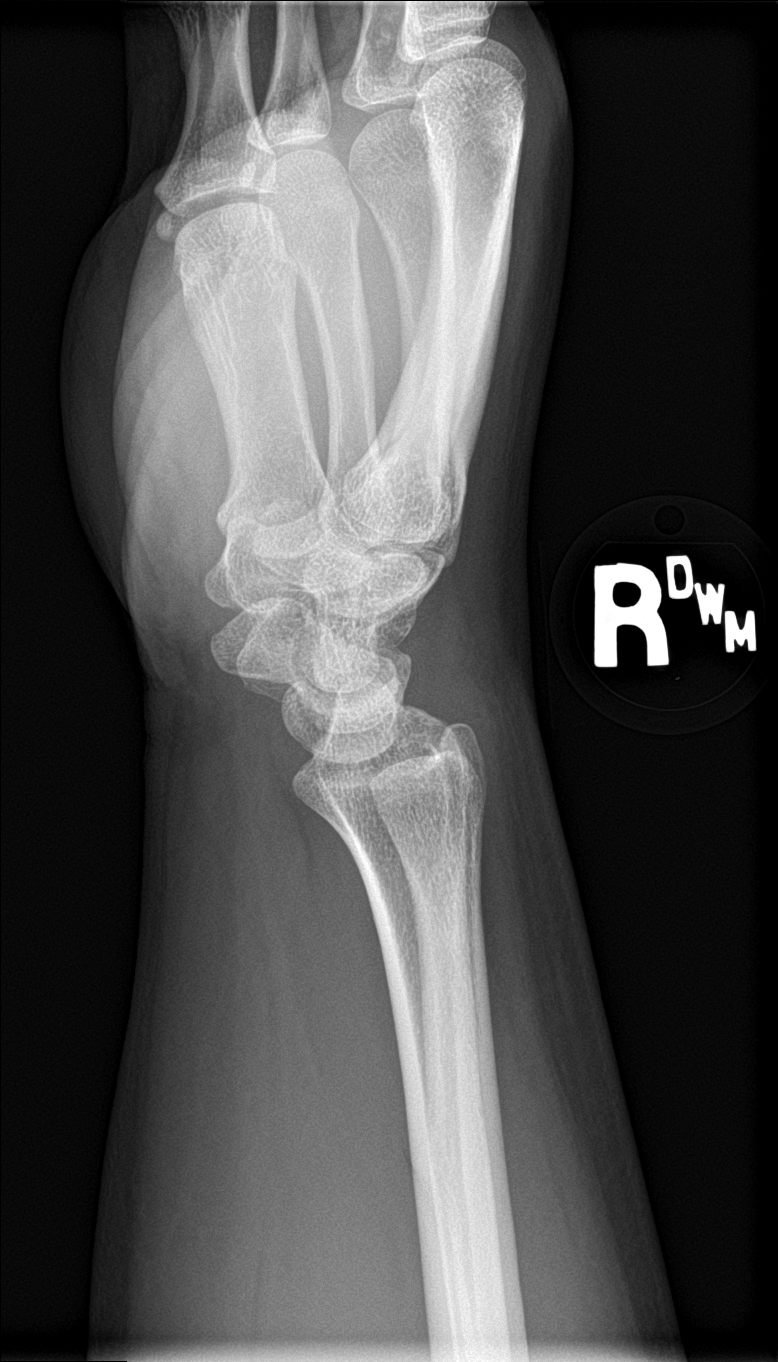

[wrist navicular]
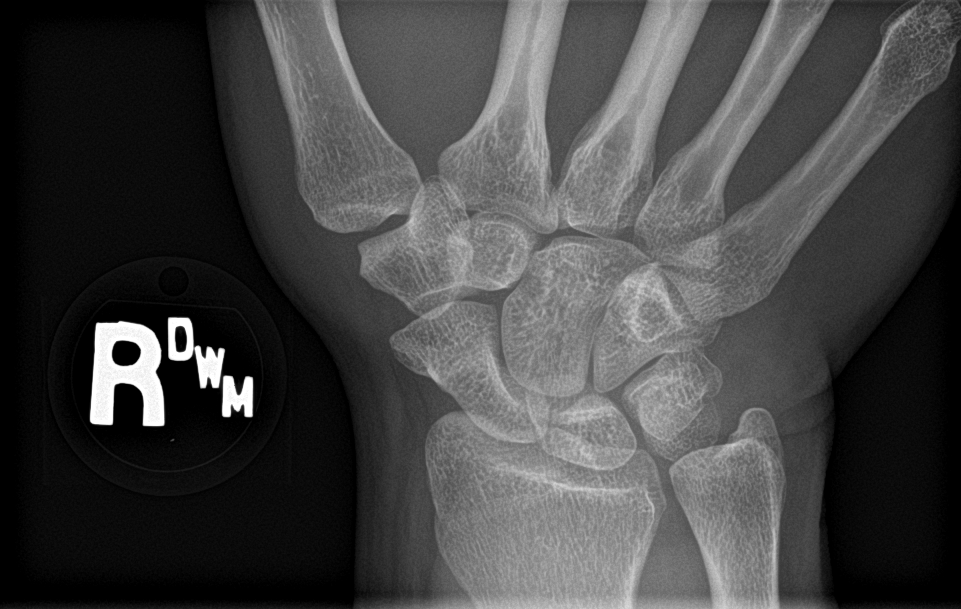

[4 of 4 positions shown; findings below may reference images not displayed]

FINDINGS: There is no evidence of fracture or dislocation. There is no
evidence of arthropathy or other focal bone abnormality. Soft
tissues are unremarkable.
IMPRESSION: Negative.

## 2018-01-11 NOTE — Progress Notes (Signed)
Subjective:    Patient ID: Candace Greene, female    DOB: 09/02/1970, 47 y.o.   MRN: [id number]  HPI  47 year old female with a history of dysfunctional uterine bleeding comes in today complaining of abdominal pain. We had actually referred her to GI about a year ago in j[place]ly of 18 for dark stools and abdominal pain. She had a colonoscopy in 11/2016 showing some mild proctitis in the rectum.   She has had 4 days of LLQ pain that radiates into her side and  back.  She says it started a couple hours after eating a hot dog and some fries at work so initially thought maybe her stomach was getting upset maybe she even had food poisoning.  She never experienced diarrhea.  She is felt nauseated but has not vomited.  Took some Pepto Bismol and helped some. Initially thought was food poisoning.  Her pain is been coming and going ever since then.  She says it is worse with activity or if she tries to walk quickly.  It is worse if she jumps or hit a bump while driving.  No fevers, chills, or sweats.  She has tried Excedrin for her pain as well and it does seem to help a little bit.  She says the pain has actually been waking her up at night.  No diarrhea.  + gassy. BM s are irregular.  Denies any hematuria or dysuria.  Her last period was about a week and a half ago.  Review of Systems  BP 139/65   Pulse (!) 55   Temp 98.1 F (36.7 C)   Ht 5\' 2"  (1.575 m)   Wt 193 lb (87.5 kg)   SpO2 99%   BMI 35.30 kg/m     No Known Allergies  Past Medical History:  Diagnosis Date  . Heel spur    right  . Pulmonary embolus [facility])     Past Surgical History:  Procedure Laterality Date  . No prior surgery      Social History   Socioeconomic History  . Marital status: Married    Spouse name: Not on file  . Number of children: 2  . Years of education: Not on file  . Highest education level: Not on file  Occupational History    Comment: [place]  . Financial resource strain: Not on file  .  Food insecurity:    Worry: Not on file    Inability: Not on file  . Transportation needs:    Medical: Not on file    Non-medical: Not on file  Tobacco Use  . Smoking status: Never Smoker  . Smokeless tobacco: Never Used  Substance and Sexual Activity  . Alcohol use: Yes    Alcohol/week: 0.0 standard drinks    Comment: 3 glasses wine per night  . Drug use: No  . Sexual activity: Not on file  Lifestyle  . Physical activity:    Days per week: Not on file    Minutes per session: Not on file  . Stress: Not on file  Relationships  . Social connections:    Talks on phone: Not on file    Gets together: Not on file    Attends religious service: Not on file    Active member of club or organization: Not on file    Attends meetings of clubs or organizations: Not on file    Relationship status: Not on file  . Intimate partner violence:  Fear of current or ex partner: Not on file    Emotionally abused: Not on file    Physically abused: Not on file    Forced sexual activity: Not on file  Other Topics Concern  . Not on file  Social History Narrative  . Not on file    Family History  Problem Relation Age of Onset  . Stroke Father        <60  . Heart attack Father   . Birth defects Other   . Hyperlipidemia Other   . Breast cancer Maternal Grandmother   . Breast cancer Maternal Aunt   . Cancer Unknown        GF    Outpatient Encounter Medications as of 01/11/2018  Medication Sig  . butalbital-acetaminophen-caffeine (FIORICET, ESGIC) 50-325-40 MG tablet Take by mouth.  . diclofenac sodium (VOLTAREN) 1 % GEL Apply 4 g topically 4 (four) times daily. To affected joint.  . meloxicam (MOBIC) 15 MG tablet One tab PO qAM with breakfast for 2 weeks, then daily prn pain.  . rizatriptan (MAXALT-MLT) 10 MG disintegrating tablet Take 1 tablet (10 mg total) by mouth as needed for migraine. May repeat in 2 hours if needed  . topiramate (TOPAMAX) 50 MG tablet Take 1 tablet (50 mg total) by  mouth 2 (two) times daily.  . [DISCONTINUED] HYDROcodone-homatropine (HYCODAN) 5-1.5 MG/5ML syrup Take 5 mLs by mouth at bedtime as needed.  . [DISCONTINUED] benzonatate (TESSALON) 200 MG capsule Take 1 capsule (200 mg total) by mouth 2 (two) times daily as needed for cough.  . [DISCONTINUED] cyclobenzaprine (FLEXERIL) 5 MG tablet Take by mouth.  . [DISCONTINUED] nystatin (NYSTATIN) powder 1 application under each breast BID  . [DISCONTINUED] predniSONE (DELTASONE) 50 MG tablet Take 1 tablet (50 mg total) by mouth daily.   No facility-administered encounter medications on file as of 01/11/2018.          Objective:   Physical Exam  Constitutional: She is oriented to person, place, and time. She appears well-developed and well-nourished.  HENT:  Head: Normocephalic and atraumatic.  Cardiovascular: Normal rate, regular rhythm and normal heart sounds.  Pulmonary/Chest: Effort normal and breath sounds normal.  Abdominal: Soft. Bowel sounds are normal. She exhibits no distension and no mass. There is tenderness. There is no guarding. No hernia.  Is very tender in the left upper quadrant and epigastric area.  Mildly tender in the left lower quadrant.  She is also tender right around the umbilicus.  Some mild CVA tenderness on the left.  Neurological: She is alert and oriented to person, place, and time.  Skin: Skin is warm and dry.  Psychiatric: She has a normal mood and affect. Her behavior is normal.       Assessment & Plan:  Left Sided abdominal pain -  she is extremely tender on exam and based on her symptoms recommend that we evaluate her further for possibility of diverticulitis. CT abd/pelvis ordered.    Also consider could be a kidney stone but she has not been noticing any hematuria.  Also check electrolytes, renal function since the pain does radiate around to her back as well as a lipase just wants to make sure that she does not have any signs of pancreatitis.   UA - negative UPT -  negative.

## 2018-02-06 ENCOUNTER — Ambulatory Visit (INDEPENDENT_AMBULATORY_CARE_PROVIDER_SITE_OTHER): Payer: BLUE CROSS/BLUE SHIELD | Admitting: Family Medicine

## 2018-02-06 ENCOUNTER — Encounter: Payer: Self-pay | Admitting: Family Medicine

## 2018-02-06 VITALS — BP 118/62 | HR 57 | Ht 62.0 in | Wt 198.0 lb

## 2018-02-06 DIAGNOSIS — R21 Rash and other nonspecific skin eruption: Secondary | ICD-10-CM

## 2018-02-06 MED ORDER — HYDROCORTISONE VALERATE 0.2 % EX OINT: 1.0000 "application " | TOPICAL_OINTMENT | Freq: Two times a day (BID) | CUTANEOUS | 0 refills | Status: DC

## 2018-02-06 NOTE — Progress Notes (Signed)
Subjective:    Patient ID: Candace Greene, female    DOB: 11-30-1970, 47 y.o.   MRN: [id number]  HPI Noticed a rash under her left breast yesterday.  She denies any changes, in soap, detergents, lotions etc.  Over the summer usually uses nystatin powder but she is out.  Says last night she noticed some soreness and discomfort under the left axilla and along the lateral portion of the breast.  She has not had any fevers chills or sweats.  She says she was not experiencing any itching or irritation she just noticed it when she took her bra off last night.  She does not remember any trauma or injury.   Review of Systems   BP 118/62   Pulse (!) 57   Ht 5\' 2"  (1.575 m)   Wt 198 lb (89.8 kg)   SpO2 100%   BMI 36.21 kg/m     No Known Allergies  Past Medical History:  Diagnosis Date  . Heel spur    right  . Pulmonary embolus [facility])     Past Surgical History:  Procedure Laterality Date  . No prior surgery      Social History   Socioeconomic History  . Marital status: Married    Spouse name: Not on file  . Number of children: 2  . Years of education: Not on file  . Highest education level: Not on file  Occupational History    Comment: [place] [place]  . Financial resource strain: Not on file  . Food insecurity:    Worry: Not on file    Inability: Not on file  . Transportation needs:    Medical: Not on file    Non-medical: Not on file  Tobacco Use  . Smoking status: Never Smoker  . Smokeless tobacco: Never Used  Substance and Sexual Activity  . Alcohol use: Yes    Alcohol/week: 0.0 standard drinks    Comment: 3 glasses wine per night  . Drug use: No  . Sexual activity: Not on file  Lifestyle  . Physical activity:    Days per week: Not on file    Minutes per session: Not on file  . Stress: Not on file  Relationships  . Social connections:    Talks on phone: Not on file    Gets together: Not on file    Attends religious service: Not on file    Active  member of club or organization: Not on file    Attends meetings of clubs or organizations: Not on file    Relationship status: Not on file  . Intimate partner violence:    Fear of current or ex partner: Not on file    Emotionally abused: Not on file    Physically abused: Not on file    Forced sexual activity: Not on file  Other Topics Concern  . Not on file  Social History Narrative  . Not on file    Family History  Problem Relation Age of Onset  . Stroke Father        <60  . Heart attack Father   . Birth defects Other   . Hyperlipidemia Other   . Breast cancer Maternal Grandmother   . Breast cancer Maternal Aunt   . Cancer Unknown        GF    Outpatient Encounter Medications as of 02/06/2018  Medication Sig  . butalbital-acetaminophen-caffeine (FIORICET, ESGIC) 50-325-40 MG tablet Take by mouth.  . diclofenac sodium (  VOLTAREN) 1 % GEL Apply 4 g topically 4 (four) times daily. To affected joint.  . meloxicam (MOBIC) 15 MG tablet One tab PO qAM with breakfast for 2 weeks, then daily prn pain.  . rizatriptan (MAXALT-MLT) 10 MG disintegrating tablet Take 1 tablet (10 mg total) by mouth as needed for migraine. May repeat in 2 hours if needed  . topiramate (TOPAMAX) 50 MG tablet Take 1 tablet (50 mg total) by mouth 2 (two) times daily.  . hydrocortisone valerate ointment (WEST-CORT) 0.2 % Apply 1 application topically 2 (two) times daily.   No facility-administered encounter medications on file as of 02/06/2018.           Objective:   Physical Exam  Constitutional: She is oriented to person, place, and time. She appears well-developed and well-nourished.  HENT:  Head: Normocephalic and atraumatic.  Eyes: Conjunctivae and EOM are normal.  Cardiovascular: Normal rate.  Pulmonary/Chest: Effort normal.  Neurological: She is alert and oriented to person, place, and time.  Skin: Skin is dry. No pallor.  On the left upper breast she has some erythematous streaks.   Interestingly they are following the lines of her stretch marks.  I do not see the actual break in the skin.  There is no induration or heat to it.  Nontender on exam.  I do not palpate any abnormal swelling or lumps in the entire breast or in the left axilla.  Psychiatric: She has a normal mood and affect. Her behavior is normal.  Vitals reviewed.       Assessment & Plan:  Rash -unclear etiology but will treat with topical steroid cream it does not look fungal. They almost look like excoriations. If not better in about 5 days then consider trial treatment with an antibiotic.  It does not quite have the heat and induration that would typically be seen with mastitis but I did encourage her to call back sooner if she is noticing any swelling, redness, nipple discharge, fevers chills or sweats or knots or nodules in the axilla.  Last mammogram was in July and she is due for a six-month follow-up in January.  If symptoms persist or worsen then we may also consider going ahead and getting further work-up with an additional diagnostic mammogram.   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  EXAM: DIGITAL DIAGNOSTIC LEFT MAMMOGRAM WITH CAD  ULTRASOUND LEFT BREAST  COMPARISON:  05/16/2017 and earlier  ACR Breast Density Category c: The breast tissue is heterogeneously dense, which may obscure small masses.  FINDINGS: No suspicious mass, distortion, or microcalcifications are identified to suggest presence of malignancy. No persistent asymmetry or mass identified mammographically.  Mammographic images were processed with CAD.  On physical exam, there is no palpable abnormality in the LATERAL aspect of the LEFT breast.  Targeted ultrasound is performed, showing a circumscribed hypoechoic mass with posterior acoustic enhancement in the 3 o'clock location of the LEFT breast 7 centimeters from the nipple which measures 0.3 X 0.2 X 0.5  centimeters. No acoustic shadowing or suspicious features.  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

## 2018-02-06 NOTE — Patient Instructions (Signed)
Call if rash isn't better in 5 days. If not better we will try an antibiotic

## 2018-04-04 ENCOUNTER — Ambulatory Visit: Payer: BLUE CROSS/BLUE SHIELD | Admitting: Osteopathic Medicine

## 2018-04-06 ENCOUNTER — Ambulatory Visit: Payer: BLUE CROSS/BLUE SHIELD | Admitting: Family Medicine

## 2018-04-06 ENCOUNTER — Ambulatory Visit: Payer: BLUE CROSS/BLUE SHIELD | Admitting: Osteopathic Medicine

## 2018-04-11 ENCOUNTER — Emergency Department (INDEPENDENT_AMBULATORY_CARE_PROVIDER_SITE_OTHER)
Admission: EM | Admit: 2018-04-11 | Discharge: 2018-04-11 | Disposition: A | Discharge status: Home / Self Care | Payer: BLUE CROSS/BLUE SHIELD | Source: Home / Self Care

## 2018-04-11 ENCOUNTER — Other Ambulatory Visit: Payer: Self-pay | Admitting: Family Medicine

## 2018-04-11 ENCOUNTER — Other Ambulatory Visit: Payer: Self-pay

## 2018-04-11 ENCOUNTER — Encounter: Payer: Self-pay | Admitting: *Deleted

## 2018-04-11 DIAGNOSIS — J069 Acute upper respiratory infection, unspecified: Secondary | ICD-10-CM

## 2018-04-11 DIAGNOSIS — J019 Acute sinusitis, unspecified: Secondary | ICD-10-CM

## 2018-04-11 DIAGNOSIS — M549 Dorsalgia, unspecified: Secondary | ICD-10-CM

## 2018-04-11 MED ORDER — PREDNISONE 20 MG PO TABS: ORAL_TABLET | ORAL | 0 refills | Status: DC

## 2018-04-11 MED ORDER — DOXYCYCLINE HYCLATE 100 MG PO CAPS: 100.0000 mg | ORAL_CAPSULE | Freq: Two times a day (BID) | ORAL | 0 refills | Status: AC

## 2018-04-11 MED ORDER — METHOCARBAMOL 500 MG PO TABS: 500.0000 mg | ORAL_TABLET | Freq: Two times a day (BID) | ORAL | 0 refills | Status: DC

## 2018-04-11 NOTE — ED Provider Notes (Signed)
[name]    CSN: [id number] Arrival date & time: 04/11/18  1844     History   Chief Complaint Chief Complaint  Patient presents with  . Cough    HPI Candace Greene is a 47 y.o. female.   HPI  Candace Greene is a 47 y.o. female presenting to UC with c/o cough for 2-3 weeks. She was getting better but worsened over the last few days,  Yesterday she developed a severe HA, upper back pain and sinus pain.  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 Pt concerned she needs an antibiotic. Denies fever, n/v/d. No chest pain or SOB.     Past Medical History:  Diagnosis Date  . Heel spur    right  . Pulmonary embolus [facility])     Patient Active Problem List   Diagnosis Date Noted  . Cubital tunnel syndrome 05/11/2017  . Right knee pain 06/27/2016  . Fatty liver 11/04/2015  . Pica 09/01/2015  . DUB (dysfunctional uterine bleeding) 09/01/2015  . Iron deficiency anemia 09/01/2015  . Menorrhagia with irregular cycle 09/01/2015  . Ovarian cyst 06/16/2015  . Uterine fibroid 06/16/2015  . Elevated lipase 06/15/2015  . Generalized abdominal pain 06/15/2015  . Abnormal echocardiogram 05/06/2015  . History of pulmonary embolism 04/17/2015  . Wrist tendonitis 11/28/2014    Past Surgical History:  Procedure Laterality Date  . No prior surgery      OB History   No obstetric history on file.      Home Medications    Prior to Admission medications   Medication Sig Start Date End Date Taking? Authorizing Provider  Aspirin-Acetaminophen-Caffeine (EXCEDRIN PO) Take by mouth.   Yes [provider]  butalbital-acetaminophen-caffeine (FIORICET, ESGIC) 50-325-40 MG tablet Take by mouth. 03/28/17  Yes [provider]  doxycycline (VIBRAMYCIN) 100 MG capsule Take 1 capsule (100 mg total) by mouth 2 (two) times daily for 10 days. 04/11/18 04/21/18  Noe Gens, PA-C  methocarbamol (ROBAXIN) 500 MG tablet Take 1 tablet (500 mg total) by  mouth 2 (two) times daily. 04/11/18   Noe Gens, PA-C  predniSONE (DELTASONE) 20 MG tablet 3 tabs po day one, then 2 po daily x 4 days 04/11/18   Noe Gens, PA-C    Family History Family History  Problem Relation Age of Onset  . Stroke Father        <60  . Heart attack Father   . Birth defects Other   . Hyperlipidemia Other   . Breast cancer Maternal Grandmother   . Breast cancer Maternal Aunt   . Cancer Other        GF    Social History Social History   Tobacco Use  . Smoking status: Never Smoker  . Smokeless tobacco: Never Used  Substance Use Topics  . Alcohol use: Yes    Alcohol/week: 0.0 standard drinks    Comment: 3 glasses wine per night  . Drug use: No     Allergies   Patient has no known allergies.   Review of Systems Review of Systems  Constitutional: Negative for chills and fever.  HENT: Positive for congestion, sinus pressure, sinus pain and sore throat. Negative for ear pain, trouble swallowing and voice change.   Respiratory: Positive for cough. Negative for shortness of breath.   Cardiovascular: Negative for chest pain and palpitations.  Gastrointestinal: Negative for abdominal pain, diarrhea, nausea and vomiting.  Musculoskeletal: Positive for back pain and  myalgias. Negative for arthralgias.  Skin: Negative for rash.  Neurological: Positive for headaches. Negative for dizziness and light-headedness.     Physical Exam Triage Vital Signs ED Triage Vitals  Enc Vitals Group     BP 04/11/18 1904 (!) 153/92     Pulse Rate 04/11/18 1904 60     Resp 04/11/18 1904 18     Temp 04/11/18 1904 98 F (36.7 C)     Temp Source 04/11/18 1904 Oral     SpO2 04/11/18 1904 99 %     Weight 04/11/18 1905 198 lb (89.8 kg)     Height 04/11/18 1905 5\' 2"  (1.575 m)     Head Circumference --      Peak Flow --      Pain Score 04/11/18 1905 0     Pain Loc --      Pain Edu? --      Excl. in [place]? --    No data found.  Updated Vital Signs BP (!) 153/92  (BP Location: Right Arm)   Pulse 60   Temp 98 F (36.7 C) (Oral)   Resp 18   Ht 5\' 2"  (1.575 m)   Wt 198 lb (89.8 kg)   LMP 04/11/2018   SpO2 99%   BMI 36.21 kg/m   Visual Acuity Right Eye Distance:   Left Eye Distance:   Bilateral Distance:    Right Eye Near:   Left Eye Near:    Bilateral Near:     Physical Exam Vitals signs and nursing note reviewed.  Constitutional:      Appearance: Normal appearance. She is well-developed.  HENT:     Head: Normocephalic and atraumatic.     Right Ear: Tympanic membrane normal.     Nose: Mucosal edema present.     Right Sinus: Maxillary sinus tenderness and frontal sinus tenderness present.     Left Sinus: Maxillary sinus tenderness and frontal sinus tenderness present.     Mouth/Throat:     Lips: Pink.     Mouth: Mucous membranes are moist.     Pharynx: Oropharynx is clear.  Neck:     Musculoskeletal: Normal range of motion and neck supple.  Cardiovascular:     Rate and Rhythm: Normal rate and regular rhythm.  Pulmonary:     Effort: Pulmonary effort is normal. No respiratory distress.     Breath sounds: Normal breath sounds. No stridor. No wheezing or rhonchi.  Musculoskeletal: Normal range of motion.        General: Tenderness present.     Comments: No spinal tenderness. Diffuse upper back muscle tenderness. Full ROM upper and lower extremities.   Skin:    General: Skin is warm and dry.  Neurological:     Mental Status: She is alert and oriented to person, place, and time.  Psychiatric:        Behavior: Behavior normal.      UC Treatments / Results  Labs (all labs ordered are listed, but only abnormal results are displayed) Labs Reviewed - No data to display  EKG None  Radiology No results found.  Procedures Procedures (including critical care time)  Medications Ordered in UC Medications - No data to display  Initial Impression / Assessment and Plan / UC Course  I have reviewed the triage vital signs and  the nursing notes.  Pertinent labs & imaging results that were available during my care of the patient were reviewed by me and considered in my medical decision making (  see chart for details).     Pain pain likely muscle strain from coughing Will cover for bacterial infection given duration of symptoms  Final Clinical Impressions(s) / UC Diagnoses   Final diagnoses:  Upper respiratory tract infection, unspecified type  Upper back pain  Acute rhinosinusitis     Discharge Instructions      Please take antibiotics as prescribed and be sure to complete entire course even if you start to feel better to ensure infection does not come back.  You may take the prednisone as prescribed to help with inflammation in your sinuses to help relieve pain/pressure as well as help with inflammation in your lungs to help with cough/chest tightness. This medication can make it difficult to sleep at night so you may want to start tomorrow morning for breakfast.   Robaxin (methocarbamol) is a muscle relaxer and may cause drowsiness. Do not drink alcohol, drive, or operate heavy machinery while taking.  You may take 500mg  acetaminophen every 4-6 hours or in combination with ibuprofen 400-600mg  every 6-8 hours as needed for pain, inflammation, and fever.  Be sure to well hydrated with clear liquids and get at least 8 hours of sleep at night, preferably more while sick.   Please follow up with family medicine in 1 week if needed.      ED Prescriptions    Medication Sig Dispense Auth. Provider   doxycycline (VIBRAMYCIN) 100 MG capsule Take 1 capsule (100 mg total) by mouth 2 (two) times daily for 10 days. 20 capsule Gerarda Fraction, Kenndra Morris O, PA-C   predniSONE (DELTASONE) 20 MG tablet 3 tabs po day one, then 2 po daily x 4 days 11 tablet Embry Huss O, PA-C   methocarbamol (ROBAXIN) 500 MG tablet Take 1 tablet (500 mg total) by mouth 2 (two) times daily. 20 tablet Noe Gens, PA-C     Controlled Substance  Prescriptions [place] Controlled Substance Registry consulted? Not Applicable   Noe Gens, PA-C 04/12/18 1104

## 2018-04-11 NOTE — ED Triage Notes (Signed)
Pt c/o cough x 2 wks with HA, upper back ache, and sinus pain x 1 day.

## 2018-04-11 NOTE — Discharge Instructions (Signed)
°  Please take antibiotics as prescribed and be sure to complete entire course even if you start to feel better to ensure infection does not come back.  You may take the prednisone as prescribed to help with inflammation in your sinuses to help relieve pain/pressure as well as help with inflammation in your lungs to help with cough/chest tightness. This medication can make it difficult to sleep at night so you may want to start tomorrow morning for breakfast.   Robaxin (methocarbamol) is a muscle relaxer and may cause drowsiness. Do not drink alcohol, drive, or operate heavy machinery while taking.  You may take 500mg  acetaminophen every 4-6 hours or in combination with ibuprofen 400-600mg  every 6-8 hours as needed for pain, inflammation, and fever.  Be sure to well hydrated with clear liquids and get at least 8 hours of sleep at night, preferably more while sick.   Please follow up with family medicine in 1 week if needed.

## 2018-04-12 ENCOUNTER — Ambulatory Visit: Payer: BLUE CROSS/BLUE SHIELD | Admitting: Family Medicine

## 2018-05-07 ENCOUNTER — Ambulatory Visit: Payer: BLUE CROSS/BLUE SHIELD | Admitting: Family Medicine

## 2018-05-08 ENCOUNTER — Telehealth: Payer: Self-pay

## 2018-05-08 MED ORDER — FLUCONAZOLE 150 MG PO TABS: 150.0000 mg | ORAL_TABLET | Freq: Once | ORAL | 0 refills | Status: AC

## 2018-05-08 NOTE — Telephone Encounter (Signed)
Prescription sent to pharmacy.  Not improving then will need an appointment.

## 2018-05-08 NOTE — Telephone Encounter (Signed)
Jadah states she has vaginal itching and would like medication for a yeast infection. Denies fever, chills, sweats or pelvic pain. She has not tried anything OTC for symptoms. She did take an antibiotic a month ago, per patient.

## 2018-05-10 NOTE — Telephone Encounter (Signed)
Left message advising patient of recommendations.  °

## 2018-05-23 ENCOUNTER — Other Ambulatory Visit: Payer: BLUE CROSS/BLUE SHIELD

## 2018-05-25 ENCOUNTER — Ambulatory Visit
Admission: RE | Admit: 2018-05-25 | Discharge: 2018-05-25 | Disposition: A | Discharge status: Home / Self Care | Payer: BLUE CROSS/BLUE SHIELD | Source: Ambulatory Visit | Attending: Family Medicine | Admitting: Family Medicine

## 2018-05-25 DIAGNOSIS — N6320 Unspecified lump in the left breast, unspecified quadrant: Secondary | ICD-10-CM

## 2018-06-19 ENCOUNTER — Encounter: Payer: Self-pay | Admitting: Family Medicine

## 2018-06-19 ENCOUNTER — Ambulatory Visit (INDEPENDENT_AMBULATORY_CARE_PROVIDER_SITE_OTHER): Payer: BLUE CROSS/BLUE SHIELD | Admitting: Family Medicine

## 2018-06-19 VITALS — BP 111/51 | HR 63 | Ht 62.0 in | Wt 200.0 lb

## 2018-06-19 DIAGNOSIS — J069 Acute upper respiratory infection, unspecified: Secondary | ICD-10-CM | POA: Diagnosis not present

## 2018-06-19 DIAGNOSIS — N644 Mastodynia: Secondary | ICD-10-CM

## 2018-06-19 MED ORDER — HYDROCODONE-HOMATROPINE 5-1.5 MG/5ML PO SYRP: 5.0000 mL | ORAL_SOLUTION | Freq: Every evening | ORAL | 0 refills | Status: DC | PRN

## 2018-06-19 NOTE — Progress Notes (Signed)
Established Patient Office Visit  Subjective:  Patient ID: Candace Greene, female    DOB: 21-Apr-1971  Age: 48 y.o. MRN: [id number]  CC:  Chief Complaint  Patient presents with  . Results    she stated that she has been experiencing pain x 1 wk in the same area of her L breast   . Cough    1 wk ago, tried mucinex and vicks, waking her up at night     HPI Candace Greene presents for Left breast pain.  Went in for her mammogram on January 31 she was having a follow-up imaging including ultrasound  On that left breast.  They had been following a lesion in the left breast.  She says when she went back for ultrasound in January the tech did not seem confident that she was actually finding the original lesion.  She says she noticed some tenderness in that breast during the exam.  She says since then she has noticed that it is continued to be sore.  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  She is not sure if it is related to position she denies any drainage from the nipple.  No rash or skin lesion.  She says that her breast size has gotten a little bit larger as she has gained some weight.  But she feels like her bras fit well.  She also complains of upper respiratory symptoms that started a little over a week ago.  Says that initially she had severe headache runny nose and laryngitis along with cough and possible fever though she did not really check it but she had chills and sweats.  She does feel better than she did last week but still has some persistent cough as well as chest congestion.  She said she is gone through a whole box of Mucinex and has been using Vicks.  The cough is actually keeping her up at night.  Past Medical History:  Diagnosis Date  . Heel spur    right  . Pulmonary embolus [facility])     Past Surgical History:  Procedure Laterality Date  . No prior surgery      Family History  Problem Relation Age of Onset  . Stroke Father        <60   . Heart attack Father   . Birth defects Other   . Hyperlipidemia Other   . Breast cancer Maternal Grandmother   . Breast cancer Maternal Aunt   . Cancer Other        GF    Social History   Socioeconomic History  . Marital status: Married    Spouse name: Not on file  . Number of children: 2  . Years of education: Not on file  . Highest education level: Not on file  Occupational History    Comment: [place]  . Financial resource strain: Not on file  . Food insecurity:    Worry: Not on file    Inability: Not on file  . Transportation needs:    Medical: Not on file    Non-medical: Not on file  Tobacco Use  . Smoking status: Never Smoker  . Smokeless tobacco: Never Used  Substance and Sexual Activity  . Alcohol use: Yes    Alcohol/week: 0.0 standard drinks    Comment: 3 glasses wine per night  . Drug use: No  . Sexual activity: Not on file  Lifestyle  . Physical activity:  Days per week: Not on file    Minutes per session: Not on file  . Stress: Not on file  Relationships  . Social connections:    Talks on phone: Not on file    Gets together: Not on file    Attends religious service: Not on file    Active member of club or organization: Not on file    Attends meetings of clubs or organizations: Not on file    Relationship status: Not on file  . Intimate partner violence:    Fear of current or ex partner: Not on file    Emotionally abused: Not on file    Physically abused: Not on file    Forced sexual activity: Not on file  Other Topics Concern  . Not on file  Social History Narrative  . Not on file    Outpatient Medications Prior to Visit  Medication Sig Dispense Refill  . Aspirin-Acetaminophen-Caffeine (EXCEDRIN PO) Take by mouth.    . butalbital-acetaminophen-caffeine (FIORICET, ESGIC) 50-325-40 MG tablet Take by mouth.    . methocarbamol (ROBAXIN) 500 MG tablet Take 1 tablet (500 mg total) by mouth 2 (two) times daily. 20 tablet 0  .  predniSONE (DELTASONE) 20 MG tablet 3 tabs po day one, then 2 po daily x 4 days 11 tablet 0   No facility-administered medications prior to visit.     No Known Allergies  ROS Review of Systems    Objective:    Physical Exam  Constitutional: She is oriented to person, place, and time. She appears well-developed and well-nourished.  HENT:  Head: Normocephalic and atraumatic.  Right Ear: External ear normal.  Left Ear: External ear normal.  Nose: Nose normal.  Mouth/Throat: Oropharynx is clear and moist.  TMs and canals are clear.   Eyes: Pupils are equal, round, and reactive to light. Conjunctivae and EOM are normal.  Neck: Neck supple. No thyromegaly present.  Cardiovascular: Normal rate, regular rhythm and normal heart sounds.  Pulmonary/Chest: Effort normal and breath sounds normal. She has no wheezes.  Lymphadenopathy:    She has no cervical adenopathy.  Neurological: She is alert and oriented to person, place, and time.  Skin: Skin is warm and dry.  Psychiatric: She has a normal mood and affect.    BP (!) 111/51   Pulse 63   Ht 5\' 2"  (1.575 m)   Wt 200 lb (90.7 kg)   SpO2 99%   BMI 36.58 kg/m  Wt Readings from Last 3 Encounters:  06/19/18 200 lb (90.7 kg)  04/11/18 198 lb (89.8 kg)  02/06/18 198 lb (89.8 kg)     Health Maintenance Due  Topic Date Due  . PAP SMEAR-Modifier  05/03/2018    There are no preventive care reminders to display for this patient.  Lab Results  Component Value Date   TSH 1.11 09/08/2015   Lab Results  Component Value Date   WBC 3.8 01/11/2018   HGB 11.9 01/11/2018   HCT 34.4 (L) 01/11/2018   MCV 93.7 01/11/2018   PLT 289 01/11/2018   Lab Results  Component Value Date   NA 139 01/11/2018   K 4.0 01/11/2018   CO2 25 01/11/2018   GLUCOSE 92 01/11/2018   BUN 11 01/11/2018   CREATININE 1.04 01/11/2018   BILITOT 0.5 01/11/2018   ALKPHOS 44 12/29/2015   AST 18 01/11/2018   ALT 10 01/11/2018   PROT 7.1 01/11/2018    ALBUMIN 3.7 12/29/2015   CALCIUM 9.2 01/11/2018  ANIONGAP 14 04/18/2015   Lab Results  Component Value Date   CHOL 161 08/15/2013   Lab Results  Component Value Date   HDL 70 08/15/2013   Lab Results  Component Value Date   LDLCALC 78 08/15/2013   Lab Results  Component Value Date   TRIG 64 08/15/2013   Lab Results  Component Value Date   CHOLHDL 2.3 08/15/2013   No results found for: HGBA1C    Assessment & Plan:   Problem List Items Addressed This Visit    None    Visit Diagnoses    Breast pain, left    -  Primary   Viral upper respiratory tract infection         Left breast pain-may be positional.  Discussed possibly trying some support at night such as a sports bra that may be does not have an underwire to see if this makes a difference.  We also discussed losing the extra weight that she has gained.  This may help her fit more appropriately into her current bra wear without having to get new and may help with support etc.  If she notices any skin redness rash discharge from the nipple or increase in pain then please let us know.  Instead of a one-year follow-up for the mammogram we will plan on doing a 44-month follow-up instead and she feels much more comfortable with that.  Upper respiratory infection-likely viral it sounds like she still has a little bit of excess mucus production.  We will give her some cough syrup to use at night since the cough is keeping her up.  But explained that it may take another week or 2 for the congestion to completely clear.  If at any point she is feeling worse than she can give [place] a call back.  Meds ordered this encounter  Medications  . HYDROcodone-homatropine (HYCODAN) 5-1.5 MG/5ML syrup    Sig: Take 5 mLs by mouth at bedtime as needed for cough.    Dispense:  120 mL    Refill:  0    Follow-up: Return if symptoms worsen or fail to improve.    Beatrice Lecher, MD

## 2018-08-08 ENCOUNTER — Ambulatory Visit (INDEPENDENT_AMBULATORY_CARE_PROVIDER_SITE_OTHER): Payer: BLUE CROSS/BLUE SHIELD | Admitting: Family Medicine

## 2018-08-08 ENCOUNTER — Encounter: Payer: Self-pay | Admitting: Family Medicine

## 2018-08-08 VITALS — BP 113/58 | HR 64 | Temp 99.4°F | Ht 62.0 in | Wt 202.0 lb

## 2018-08-08 DIAGNOSIS — R200 Anesthesia of skin: Secondary | ICD-10-CM

## 2018-08-08 DIAGNOSIS — M545 Low back pain, unspecified: Secondary | ICD-10-CM

## 2018-08-08 DIAGNOSIS — M7711 Lateral epicondylitis, right elbow: Secondary | ICD-10-CM

## 2018-08-08 DIAGNOSIS — R109 Unspecified abdominal pain: Secondary | ICD-10-CM | POA: Diagnosis not present

## 2018-08-08 LAB — POCT URINALYSIS DIPSTICK
Bilirubin, UA: NEGATIVE
Glucose, UA: NEGATIVE
Nitrite, UA: NEGATIVE
Protein, UA: NEGATIVE
Spec Grav, UA: 1.025
Urobilinogen, UA: 0.2 U/dL
pH, UA: 5.5

## 2018-08-08 MED ORDER — CYCLOBENZAPRINE HCL 10 MG PO TABS: 10.0000 mg | ORAL_TABLET | Freq: Every evening | ORAL | 0 refills | Status: DC | PRN

## 2018-08-08 NOTE — Progress Notes (Signed)
Acute Office Visit  Subjective:    Patient ID: Candace Greene, female    DOB: June 01, 1970, 48 y.o.   MRN: [id number]  Chief Complaint  Patient presents with  . Back Pain    R sided mid back pain x 2 wks 8/10 she reports that her pain is aching and wakes her up at night  . Arm Pain    R arm she said that this arm has a tendency to go numb     HPI Patient is in today for right sided flank pain that has been present for at least a month. 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 She is right handed and sometime has to lean in to pick up the chicken.  She says the pain has been severe at time. Denies any urinary sxs or fever.  No know injury.    She also c/or right outer elbow pain that has been present for > 1 month as well. Has been icing during her work shift.  Says it is getting to the point where she is getting some numbness over the outer elbow and even into her hand.    Past Medical History:  Diagnosis Date  . Heel spur    right  . Pulmonary embolus [facility])     Past Surgical History:  Procedure Laterality Date  . No prior surgery      Family History  Problem Relation Age of Onset  . Stroke Father        <60  . Heart attack Father   . Birth defects Other   . Hyperlipidemia Other   . Breast cancer Maternal Grandmother   . Breast cancer Maternal Aunt   . Cancer Other        GF    Social History   Socioeconomic History  . Marital status: Married    Spouse name: Not on file  . Number of children: 2  . Years of education: Not on file  . Highest education level: Not on file  Occupational History    Comment: [place]  . Financial resource strain: Not on file  . Food insecurity:    Worry: Not on file    Inability: Not on file  . Transportation needs:    Medical: Not on file    Non-medical: Not on file  Tobacco Use  . Smoking status: Never Smoker  . Smokeless tobacco: Never Used  Substance  and Sexual Activity  . Alcohol use: Yes    Alcohol/week: 0.0 standard drinks    Comment: 3 glasses wine per night  . Drug use: No  . Sexual activity: Not on file  Lifestyle  . Physical activity:    Days per week: Not on file    Minutes per session: Not on file  . Stress: Not on file  Relationships  . Social connections:    Talks on phone: Not on file    Gets together: Not on file    Attends religious service: Not on file    Active member of club or organization: Not on file    Attends meetings of clubs or organizations: Not on file    Relationship status: Not on file  . Intimate partner violence:    Fear of current or ex partner: Not on file    Emotionally abused: Not on file    Physically abused: Not on file    Forced sexual  activity: Not on file  Other Topics Concern  . Not on file  Social History Narrative  . Not on file    Outpatient Medications Prior to Visit  Medication Sig Dispense Refill  . Aspirin-Acetaminophen-Caffeine (EXCEDRIN PO) Take by mouth.    . butalbital-acetaminophen-caffeine (FIORICET, ESGIC) 50-325-40 MG tablet Take by mouth.    Marland Kitchen HYDROcodone-homatropine (HYCODAN) 5-1.5 MG/5ML syrup Take 5 mLs by mouth at bedtime as needed for cough. 120 mL 0   No facility-administered medications prior to visit.     No Known Allergies  ROS     Objective:    Physical Exam  Constitutional: She is oriented to person, place, and time. She appears well-developed and well-nourished.  HENT:  Head: Normocephalic and atraumatic.  Eyes: Conjunctivae and EOM are normal.  Cardiovascular: Normal rate.  Pulmonary/Chest: Effort normal.  Musculoskeletal:     Comments: Right arm and shoulder with NROM. Tender over the lateral epicondylitis.  Strength is 5/5 at the shoulder, elbow and wrist. Negative Tinnels and Phalens today.  Hand and fingers with NROM.   nontender over the lumbar spine.  Tender over the right low back above the hip.  Normal flexin and extension at the  lumbar spine.    Neurological: She is alert and oriented to person, place, and time.  Skin: Skin is dry. No pallor.  Psychiatric: She has a normal mood and affect. Her behavior is normal.  Vitals reviewed.   BP (!) 113/58   Pulse 64   Temp 99.4 F (37.4 C)   Ht 5\' 2"  (1.575 m)   Wt 202 lb (91.6 kg)   SpO2 100%   BMI 36.95 kg/m  Wt Readings from Last 3 Encounters:  08/08/18 202 lb (91.6 kg)  06/19/18 200 lb (90.7 kg)  04/11/18 198 lb (89.8 kg)    There are no preventive care reminders to display for this patient.  There are no preventive care reminders to display for this patient.   Lab Results  Component Value Date   TSH 1.11 09/08/2015   Lab Results  Component Value Date   WBC 3.8 01/11/2018   HGB 11.9 01/11/2018   HCT 34.4 (L) 01/11/2018   MCV 93.7 01/11/2018   PLT 289 01/11/2018   Lab Results  Component Value Date   NA 139 01/11/2018   K 4.0 01/11/2018   CO2 25 01/11/2018   GLUCOSE 92 01/11/2018   BUN 11 01/11/2018   CREATININE 1.04 01/11/2018   BILITOT 0.5 01/11/2018   ALKPHOS 44 12/29/2015   AST 18 01/11/2018   ALT 10 01/11/2018   PROT 7.1 01/11/2018   ALBUMIN 3.7 12/29/2015   CALCIUM 9.2 01/11/2018   ANIONGAP 14 04/18/2015   Lab Results  Component Value Date   CHOL 161 08/15/2013   Lab Results  Component Value Date   HDL 70 08/15/2013   Lab Results  Component Value Date   LDLCALC 78 08/15/2013   Lab Results  Component Value Date   TRIG 64 08/15/2013   Lab Results  Component Value Date   CHOLHDL 2.3 08/15/2013   No results found for: HGBA1C     Assessment & Plan:   Problem List Items Addressed This Visit    None    Visit Diagnoses    Flank pain    -  Primary   Relevant Orders   POCT urinalysis dipstick (Completed)   Urine Culture   Right lateral epicondylitis       Relevant Medications   cyclobenzaprine (FLEXERIL) 10  MG tablet   Acute right-sided low back pain without sciatica       Relevant Medications    cyclobenzaprine (FLEXERIL) 10 MG tablet   Right arm numbness         Flank pain - is is lower than the kidney so likely more MSK  Right low back pain - likely muscle strain. Recommend trial of muscle relaxer and stretches. If not improving let me know.  Did check UA as she was worried about bladder infection since she has to hold her urine at work sometimes.  Handout for exercises given.   Right lateral epicondylitis - discussed dx.  Will traet with stretches, icing and elbow strap. If not better consider steroid injection in 2 weeks.  The repetitive motion at work will likely continue to exacerbate her symptoms. Handout for exercises given.   Right arm numbness - consider carpal tunnel vs nerve entrapment at elbow or upper arm.  Negative tinnels and phalens.     Meds ordered this encounter  Medications  . cyclobenzaprine (FLEXERIL) 10 MG tablet    Sig: Take 1 tablet (10 mg total) by mouth at bedtime as needed for muscle spasms.    Dispense:  30 tablet    Refill:  0     Beatrice Lecher, MD

## 2018-08-10 LAB — URINE CULTURE
MICRO NUMBER:: 397584
SPECIMEN QUALITY:: ADEQUATE

## 2018-08-20 ENCOUNTER — Other Ambulatory Visit: Payer: Self-pay

## 2018-08-20 ENCOUNTER — Ambulatory Visit (INDEPENDENT_AMBULATORY_CARE_PROVIDER_SITE_OTHER): Payer: BLUE CROSS/BLUE SHIELD

## 2018-08-20 ENCOUNTER — Telehealth: Payer: Self-pay | Admitting: *Deleted

## 2018-08-20 ENCOUNTER — Encounter: Payer: Self-pay | Admitting: Family Medicine

## 2018-08-20 ENCOUNTER — Ambulatory Visit (INDEPENDENT_AMBULATORY_CARE_PROVIDER_SITE_OTHER): Payer: BLUE CROSS/BLUE SHIELD | Admitting: Family Medicine

## 2018-08-20 VITALS — BP 110/49 | HR 64 | Temp 99.6°F | Ht 62.0 in | Wt 202.0 lb

## 2018-08-20 DIAGNOSIS — R059 Cough, unspecified: Secondary | ICD-10-CM

## 2018-08-20 DIAGNOSIS — R0789 Other chest pain: Secondary | ICD-10-CM | POA: Diagnosis not present

## 2018-08-20 DIAGNOSIS — L918 Other hypertrophic disorders of the skin: Secondary | ICD-10-CM | POA: Diagnosis not present

## 2018-08-20 DIAGNOSIS — M549 Dorsalgia, unspecified: Secondary | ICD-10-CM | POA: Diagnosis not present

## 2018-08-20 DIAGNOSIS — R05 Cough: Secondary | ICD-10-CM

## 2018-08-20 LAB — CBC WITH DIFFERENTIAL/PLATELET
Absolute Monocytes: 780 cells/uL (ref 200–950)
Basophils Absolute: 20 cells/uL (ref 0–200)
Basophils Relative: 0.5 %
Eosinophils Absolute: 32 cells/uL (ref 15–500)
Eosinophils Relative: 0.8 %
HCT: 35.8 % (ref 35.0–45.0)
Hemoglobin: 12.1 g/dL (ref 11.7–15.5)
Lymphs Abs: 1568 cells/uL (ref 850–3900)
MCH: 31.5 pg (ref 27.0–33.0)
MCHC: 33.8 g/dL (ref 32.0–36.0)
MCV: 93.2 fL (ref 80.0–100.0)
MPV: 10.8 fL (ref 7.5–12.5)
Monocytes Relative: 19.5 %
Neutro Abs: 1600 cells/uL (ref 1500–7800)
Neutrophils Relative %: 40 %
Platelets: 288 10*3/uL (ref 140–400)
RBC: 3.84 10*6/uL (ref 3.80–5.10)
RDW: 13.9 % (ref 11.0–15.0)
Total Lymphocyte: 39.2 %
WBC: 4 10*3/uL (ref 3.8–10.8)

## 2018-08-20 LAB — D-DIMER, QUANTITATIVE (NOT AT ARMC): D-Dimer, Quant: 0.98 mcg/mL FEU — ABNORMAL HIGH (ref <0.50)

## 2018-08-20 NOTE — Telephone Encounter (Signed)
Pt called back and she has laryngitis! She would like to know what she can take for this. I advised that she gargle w/warm salt water, and try tea with honey and lemon. She feels that she has cold coming up into her chest. I told her that I would fwd this to Dr. Madilyn Fireman for her recommendations.Maryruth Eve, [name], CMA

## 2018-08-20 NOTE — Progress Notes (Signed)
Acute Office Visit  Subjective:    Patient ID: Candace Greene, female    DOB: April 01, 1971, 48 y.o.   MRN: [id number]  Chief Complaint  Patient presents with  . Back Pain    upper back x 1 wk  . Nevus    R side of neck area has gotten larger and painful    HPI Patient is in today for upper mid back pain that is aching.  No injury. Has been hard to get out of the car. It started yesterday. Has had a mild dry cough and some mild SOB but not sure if it is from having to wear a mask.  No sore throat.  She does not think she is had a fever she is had some cold chills but says she is always a little cold so is not sure if it is necessarily abnormal for her.  She was also yesterday cleaning her house with ammonia and bleach and wonders if it could be somewhat fume related.  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  No GI symptoms.  She says the pain in her upper back just feels almost like someone sitting on her back.  She tried to get her husband to press on it thinking it was more the muscles and that actually made her anterior chest hurt.   She also has a mole on the right side of her neck that has suddenly gotten painful and larger. She says it looks red now. Doesn't remember any trauma.    Past Medical History:  Diagnosis Date  . Heel spur    right  . Pulmonary embolus [facility])     Past Surgical History:  Procedure Laterality Date  . No prior surgery      Family History  Problem Relation Age of Onset  . Stroke Father        <60  . Heart attack Father   . Birth defects Other   . Hyperlipidemia Other   . Breast cancer Maternal Grandmother   . Breast cancer Maternal Aunt   . Cancer Other        GF    Social History   Socioeconomic History  . Marital status: Married    Spouse name: Not on file  . Number of children: 2  . Years of education: Not  on file  . Highest education level: Not on file  Occupational History    Comment: [employer]  . Financial resource strain: Not on file  . Food insecurity:    Worry: Not on file    Inability: Not on file  . Transportation needs:    Medical: Not on file    Non-medical: Not on file  Tobacco Use  . Smoking status: Never Smoker  . Smokeless tobacco: Never Used  Substance and Sexual Activity  . Alcohol use: Yes    Alcohol/week: 0.0 standard drinks    Comment: 3 glasses wine per night  . Drug use: No  . Sexual activity: Not on file  Lifestyle  . Physical activity:    Days per week: Not on file    Minutes per session: Not on file  . Stress: Not on file  Relationships  . Social connections:    Talks on phone: Not on file    Gets together: Not on file  Attends religious service: Not on file    Active member of club or organization: Not on file    Attends meetings of clubs or organizations: Not on file    Relationship status: Not on file  . Intimate partner violence:    Fear of current or ex partner: Not on file    Emotionally abused: Not on file    Physically abused: Not on file    Forced sexual activity: Not on file  Other Topics Concern  . Not on file  Social History Narrative  . Not on file    Outpatient Medications Prior to Visit  Medication Sig Dispense Refill  . Aspirin-Acetaminophen-Caffeine (EXCEDRIN PO) Take by mouth.    . butalbital-acetaminophen-caffeine (FIORICET, ESGIC) 50-325-40 MG tablet Take by mouth.    . cyclobenzaprine (FLEXERIL) 10 MG tablet Take 1 tablet (10 mg total) by mouth at bedtime as needed for muscle spasms. 30 tablet 0   No facility-administered medications prior to visit.     No Known Allergies  ROS     Objective:    Physical Exam Physical Exam Constitutional:      Appearance: She is well-developed.  HENT:     Head: Normocephalic and atraumatic.     Right Ear: External ear normal.     Left Ear: External ear normal.      Nose: Nose normal.  Eyes:     Conjunctiva/sclera: Conjunctivae normal.     Pupils: Pupils are equal, round, and reactive to light.  Neck:     Musculoskeletal: Neck supple.     Thyroid: No thyromegaly.  Cardiovascular:     Rate and Rhythm: Normal rate and regular rhythm.     Heart sounds: Normal heart sounds.  Pulmonary:     Effort: Pulmonary effort is normal.     Breath sounds: Normal breath sounds. No wheezing.  Musculoskeletal:     Comments: Neck with normal range of motion.  Shoulders with normal range of motion.  She is mildly tender over the mid thoracic spine just above her bra strap and she is very tender over the muscles between the scapula and the spine.  Lymphadenopathy:     Cervical: No cervical adenopathy.  Skin:    General: Skin is warm and dry.     Comments: Large 1 cm skin tag on the right side of the neck is very erythematous.  Neurological:     Mental Status: She is alert and oriented to person, place, and time.      BP (!) 110/49   Pulse 64   Ht 5\' 2"  (1.575 m)   Wt 202 lb (91.6 kg)   SpO2 100%   BMI 36.95 kg/m  Wt Readings from Last 3 Encounters:  08/20/18 202 lb (91.6 kg)  08/08/18 202 lb (91.6 kg)  06/19/18 200 lb (90.7 kg)    There are no preventive care reminders to display for this patient.  There are no preventive care reminders to display for this patient.   Lab Results  Component Value Date   TSH 1.11 09/08/2015   Lab Results  Component Value Date   WBC 3.8 01/11/2018   HGB 11.9 01/11/2018   HCT 34.4 (L) 01/11/2018   MCV 93.7 01/11/2018   PLT 289 01/11/2018   Lab Results  Component Value Date   NA 139 01/11/2018   K 4.0 01/11/2018   CO2 25 01/11/2018   GLUCOSE 92 01/11/2018   BUN 11 01/11/2018   CREATININE 1.04 01/11/2018   BILITOT 0.5  01/11/2018   ALKPHOS 44 12/29/2015   AST 18 01/11/2018   ALT 10 01/11/2018   PROT 7.1 01/11/2018   ALBUMIN 3.7 12/29/2015   CALCIUM 9.2 01/11/2018   ANIONGAP 14 04/18/2015   Lab  Results  Component Value Date   CHOL 161 08/15/2013   Lab Results  Component Value Date   HDL 70 08/15/2013   Lab Results  Component Value Date   LDLCALC 78 08/15/2013   Lab Results  Component Value Date   TRIG 64 08/15/2013   Lab Results  Component Value Date   CHOLHDL 2.3 08/15/2013   No results found for: HGBA1C     Assessment & Plan:   Problem List Items Addressed This Visit    None    Visit Diagnoses    Inflamed skin tag    -  Primary   Upper back pain       Relevant Orders   DG Chest 2 View   CBC with Differential   D-Dimer, Quantitative   Cough       Relevant Orders   DG Chest 2 View   CBC with Differential   D-Dimer, Quantitative   Atypical chest pain       Relevant Orders   CBC with Differential   D-Dimer, Quantitative     Inflammed skin tag-is quite red and swollen.  No active drainage or wound.  Recommend removal.  Patient tolerated well.  Skin Tag Removal Procedure Note  Pre-operative Diagnosis: Classic skin tags (acrochordon)  Post-operative Diagnosis: Classic skin tags (acrochordon)  Locations:right side of her neck  Indications: inflammed, swollen   Anesthesia: Lidocaine 1% with epinephrine without added sodium bicarbonate  Procedure Details  The risks (including bleeding and infection) and benefits of the procedure and Verbal informed consent obtained. Using sterile iris scissors, one large skin tags were snipped off at their bases after cleansing with Betadine.  Bleeding was controlled by pressure.   Findings: Pathognomonic benign lesions  not sent for pathological exam.  Condition: Stable  Complications: none.  Plan: 1. Instructed to keep the wounds dry and covered for 24-48h and clean thereafter. 2. Warning signs of infection were reviewed.   3. Recommended that the patient use OTC acetaminophen as needed for pain.  4. Return as needed.  Upper back pain/pressure with cough-would like to get chest x-ray for further  work-up just to rule out walking pneumonia etc.  Without any fall or injury fracture would be less likely.  Consider vertebral lesion but she is having a lot of pain lateral to the spine as well.  She has been potentially exposed to [place] at her workplace so I think keeping that in mind is important as well.  We will also check a d-dimer to rule out DVT.  No orders of the defined types were placed in this encounter.    Beatrice Lecher, MD

## 2018-08-20 NOTE — Progress Notes (Signed)
  Se note  

## 2018-08-20 NOTE — Telephone Encounter (Signed)
I agree with above.  Keep up posted in the next day or two depending on how she feels.  Chest xray was OK. No sign of pneumonia.

## 2018-08-21 NOTE — Telephone Encounter (Signed)
Pt advised of recommendations. She asked if she could get a work note for today, I informed her that we are not in the office however, she should be able to obtain this from the front desk.Maryruth Eve, [name], CMA

## 2018-08-23 ENCOUNTER — Ambulatory Visit (INDEPENDENT_AMBULATORY_CARE_PROVIDER_SITE_OTHER): Payer: BLUE CROSS/BLUE SHIELD | Admitting: Family Medicine

## 2018-08-23 ENCOUNTER — Telehealth: Payer: Self-pay | Admitting: Family Medicine

## 2018-08-23 ENCOUNTER — Other Ambulatory Visit: Payer: Self-pay

## 2018-08-23 ENCOUNTER — Encounter: Payer: Self-pay | Admitting: Family Medicine

## 2018-08-23 VITALS — BP 122/79 | HR 69

## 2018-08-23 DIAGNOSIS — R05 Cough: Secondary | ICD-10-CM | POA: Diagnosis not present

## 2018-08-23 DIAGNOSIS — Z20822 Contact with and (suspected) exposure to covid-19: Secondary | ICD-10-CM

## 2018-08-23 DIAGNOSIS — Z20828 Contact with and (suspected) exposure to other viral communicable diseases: Secondary | ICD-10-CM

## 2018-08-23 DIAGNOSIS — R059 Cough, unspecified: Secondary | ICD-10-CM

## 2018-08-23 DIAGNOSIS — R6889 Other general symptoms and signs: Secondary | ICD-10-CM | POA: Diagnosis not present

## 2018-08-23 NOTE — Telephone Encounter (Signed)
Patient scheduled.

## 2018-08-23 NOTE — Progress Notes (Signed)
Acute Office Visit  Subjective:    Patient ID: Candace Greene, female    DOB: Apr 25, 1971, 48 y.o.   MRN: [id number]  No chief complaint on file.   HPI Patient is in today for cough and back pain.  I saw her 4 days ago on April 27 for upper back pain and cough.  She works at the [employer] where there have been at least 20+ cases of [place].  At that time her back pain seemed most more musculoskeletal but it was upper back with some occasional radiation into the anterior chest.  She had a mild dry cough but she is also been around some cleaning fluid the day before and was not sure if that could have triggered it.  We did do a chest x-ray at that time which was normal and reassuring.  She called back later that day to say that she felt like she was having a little bit of a sore throat and some laryngitis.  She had reported some chills.  I then spoke to her briefly last night over video visit and she reported that she was having some sweats and felt like she was running a fever but did not have a home thermometer to actually check her temperature.  We had written her out of work.  She reported she was still coughing.  I had her come in today because I was concerned about the [place].  Plus she continues to have upper back pain.  We did do labs which showed a mildly elevated d-dimer but she has an elevated d-dimer at baseline and this last d-dimer was actually a little lower compared to her old ones which was reassuring for decreasing suspicion for pulmonary embolism.  Past Medical History:  Diagnosis Date  . Heel spur    right  . Pulmonary embolus [facility])     Past Surgical History:  Procedure Laterality Date  . No prior surgery      Family History  Problem Relation Age of Onset  . Stroke Father        <60  . Heart attack Father   . Birth defects Other   . Hyperlipidemia Other   . Breast cancer Maternal Grandmother   . Breast cancer Maternal Aunt   . Cancer Other      GF    Social History   Socioeconomic History  . Marital status: Married    Spouse name: Not on file  . Number of children: 2  . Years of education: Not on file  . Highest education level: Not on file  Occupational History    Comment: [employer]  . Financial resource strain: Not on file  . Food insecurity:    Worry: Not on file    Inability: Not on file  . Transportation needs:    Medical: Not on file    Non-medical: Not on file  Tobacco Use  . Smoking status: Never Smoker  . Smokeless tobacco: Never Used  Substance and Sexual Activity  . Alcohol use: Yes    Alcohol/week: 0.0 standard drinks    Comment: 3 glasses wine per night  . Drug use: No  . Sexual activity: Not on file  Lifestyle  . Physical activity:    Days per week: Not on file    Minutes per session: Not on file  . Stress: Not on file  Relationships  . Social connections:    Talks on phone: Not on file  Gets together: Not on file    Attends religious service: Not on file    Active member of club or organization: Not on file    Attends meetings of clubs or organizations: Not on file    Relationship status: Not on file  . Intimate partner violence:    Fear of current or ex partner: Not on file    Emotionally abused: Not on file    Physically abused: Not on file    Forced sexual activity: Not on file  Other Topics Concern  . Not on file  Social History Narrative  . Not on file    Outpatient Medications Prior to Visit  Medication Sig Dispense Refill  . Aspirin-Acetaminophen-Caffeine (EXCEDRIN PO) Take by mouth.    . butalbital-acetaminophen-caffeine (FIORICET, ESGIC) 50-325-40 MG tablet Take by mouth.    . cyclobenzaprine (FLEXERIL) 10 MG tablet Take 1 tablet (10 mg total) by mouth at bedtime as needed for muscle spasms. 30 tablet 0   No facility-administered medications prior to visit.     No Known Allergies  ROS     Objective:    Physical Exam  Constitutional: She is oriented  to person, place, and time. She appears well-developed and well-nourished.  HENT:  Head: Normocephalic and atraumatic.  Cardiovascular: Normal rate, regular rhythm and normal heart sounds.  Pulmonary/Chest: Effort normal and breath sounds normal.  Neurological: She is alert and oriented to person, place, and time.  Skin: Skin is warm and dry.  Psychiatric: She has a normal mood and affect. Her behavior is normal.    BP 122/79   Pulse 69   LMP 08/18/2018   SpO2 100%  Wt Readings from Last 3 Encounters:  08/20/18 202 lb (91.6 kg)  08/08/18 202 lb (91.6 kg)  06/19/18 200 lb (90.7 kg)    There are no preventive care reminders to display for this patient.  There are no preventive care reminders to display for this patient.   Lab Results  Component Value Date   TSH 1.11 09/08/2015   Lab Results  Component Value Date   WBC 4.0 08/20/2018   HGB 12.1 08/20/2018   HCT 35.8 08/20/2018   MCV 93.2 08/20/2018   PLT 288 08/20/2018   Lab Results  Component Value Date   NA 139 01/11/2018   K 4.0 01/11/2018   CO2 25 01/11/2018   GLUCOSE 92 01/11/2018   BUN 11 01/11/2018   CREATININE 1.04 01/11/2018   BILITOT 0.5 01/11/2018   ALKPHOS 44 12/29/2015   AST 18 01/11/2018   ALT 10 01/11/2018   PROT 7.1 01/11/2018   ALBUMIN 3.7 12/29/2015   CALCIUM 9.2 01/11/2018   ANIONGAP 14 04/18/2015   Lab Results  Component Value Date   CHOL 161 08/15/2013   Lab Results  Component Value Date   HDL 70 08/15/2013   Lab Results  Component Value Date   LDLCALC 78 08/15/2013   Lab Results  Component Value Date   TRIG 64 08/15/2013   Lab Results  Component Value Date   CHOLHDL 2.3 08/15/2013   No results found for: HGBA1C     Assessment & Plan:   Problem List Items Addressed This Visit    None    Visit Diagnoses    Cough    -  Primary   Relevant Orders   SARS-COV-2 RNA,(COVID-19) QUAL NAAT   Suspected Covid-19 Virus Infection         Chest sounds were normal.  Pulse ox  was normal which  is also reassuring.  Because of high suspicion for contact with COVID I did recommend that she come in and get tested today.  We did do the nasopharyngeal swab and sent for evaluation through [facility].  Specimen was taken to the lab to be frozen and then transported.  We reviewed that she has to stay home and cannot leave her house until she gets the results she has a self quarantine.  She does live with her husband so recommended that he stay safe as possible and try to minimize exposure with him.  Did give additional information provided by the [place].  Will call with results once available.   No orders of the defined types were placed in this encounter.    Beatrice Lecher, MD

## 2018-08-23 NOTE — Telephone Encounter (Signed)
For an appointment today?

## 2018-08-23 NOTE — Telephone Encounter (Signed)
Please call pt and see if can come in at 11:30 for me to recheck her chest and swab her for COVID.  Please put her on the schedule and when arrives we can put her in one of our my rooms and check vitals and pulse ox.  Ask her to wear her mask if able.

## 2018-08-23 NOTE — Patient Instructions (Signed)
Person Under Monitoring Name: Candace Greene  Location: [address] [place] [address]   Infection Prevention Recommendations for Individuals Confirmed to have, or Being Evaluated for, 2019 Novel Coronavirus (COVID-19) Infection Who Receive Care at Home  Individuals who are confirmed to have, or are being evaluated for, COVID-19 should follow the prevention steps below until a healthcare provider or local or state health department says they can return to normal activities.  Stay home except to get medical care You should restrict activities outside your home, except for getting medical care. Do not go to work, school, or public areas, and do not use public transportation or taxis.  Call ahead before visiting your doctor Before your medical appointment, call the healthcare provider and tell them that you have, or are being evaluated for, COVID-19 infection. This will help the healthcare provider's office take steps to keep other people from getting infected. Ask your healthcare provider to call the local or state health department.  Monitor your symptoms Seek prompt medical attention if your illness is worsening (e.g., difficulty breathing). Before going to your medical appointment, call the healthcare provider and tell them that you have, or are being evaluated for, COVID-19 infection. Ask your healthcare provider to call the local or state health department.  Wear a facemask You should wear a facemask that covers your nose and mouth when you are in the same room with other people and when you visit a healthcare provider. People who live with or visit you should also wear a facemask while they are in the same room with you.  Separate yourself from other people in your home As much as possible, you should stay in a different room from other people in your home. Also, you should use a separate bathroom, if available.  Avoid sharing household items You should  not share dishes, drinking glasses, cups, eating utensils, towels, bedding, or other items with other people in your home. After using these items, you should wash them thoroughly with soap and water.  Cover your coughs and sneezes Cover your mouth and nose with a tissue when you cough or sneeze, or you can cough or sneeze into your sleeve. Throw used tissues in a lined trash can, and immediately wash your hands with soap and water for at least 20 seconds or use an alcohol-based hand rub.  Wash your [facility] your hands often and thoroughly with soap and water for at least 20 seconds. You can use an alcohol-based hand sanitizer if soap and water are not available and if your hands are not visibly dirty. Avoid touching your eyes, nose, and mouth with unwashed hands.   Prevention Steps for Caregivers and Household Members of Individuals Confirmed to have, or Being Evaluated for, COVID-19 Infection Being Cared for in the Home  If you live with, or provide care at home for, a person confirmed to have, or being evaluated for, COVID-19 infection please follow these guidelines to prevent infection:  Follow healthcare provider's instructions Make sure that you understand and can help the patient follow any healthcare provider instructions for all care.  Provide for the patient's basic needs You should help the patient with basic needs in the home and provide support for getting groceries, prescriptions, and other personal needs.  Monitor the patient's symptoms If they are getting sicker, call his or her medical provider and tell them that the patient has, or is being evaluated for, COVID-19 infection. This will help the healthcare provider's office  take steps to keep other people from getting infected. Ask the healthcare provider to call the local or state health department.  Limit the number of people who have contact with the patient  If possible, have only one caregiver for the patient.   Other household members should stay in another home or place of residence. If this is not possible, they should stay  in another room, or be separated from the patient as much as possible. Use a separate bathroom, if available.  Restrict visitors who do not have an essential need to be in the home.  Keep older adults, very young children, and other sick people away from the patient Keep older adults, very young children, and those who have compromised immune systems or chronic health conditions away from the patient. This includes people with chronic heart, lung, or kidney conditions, diabetes, and cancer.  Ensure good ventilation Make sure that shared spaces in the home have good air flow, such as from an air conditioner or an opened window, weather permitting.  Wash your hands often  Wash your hands often and thoroughly with soap and water for at least 20 seconds. You can use an alcohol based hand sanitizer if soap and water are not available and if your hands are not visibly dirty.  Avoid touching your eyes, nose, and mouth with unwashed hands.  Use disposable paper towels to dry your hands. If not available, use dedicated cloth towels and replace them when they become wet.  Wear a facemask and gloves  Wear a disposable facemask at all times in the room and gloves when you touch or have contact with the patient's blood, body fluids, and/or secretions or excretions, such as sweat, saliva, sputum, nasal mucus, vomit, urine, or feces.  Ensure the mask fits over your nose and mouth tightly, and do not touch it during use.  Throw out disposable facemasks and gloves after using them. Do not reuse.  Wash your hands immediately after removing your facemask and gloves.  If your personal clothing becomes contaminated, carefully remove clothing and launder. Wash your hands after handling contaminated clothing.  Place all used disposable facemasks, gloves, and other waste in a lined  container before disposing them with other household waste.  Remove gloves and wash your hands immediately after handling these items.  Do not share dishes, glasses, or other household items with the patient  Avoid sharing household items. You should not share dishes, drinking glasses, cups, eating utensils, towels, bedding, or other items with a patient who is confirmed to have, or being evaluated for, COVID-19 infection.  After the person uses these items, you should wash them thoroughly with soap and water.  Wash laundry thoroughly  Immediately remove and wash clothes or bedding that have blood, body fluids, and/or secretions or excretions, such as sweat, saliva, sputum, nasal mucus, vomit, urine, or feces, on them.  Wear gloves when handling laundry from the patient.  Read and follow directions on labels of laundry or clothing items and detergent. In general, wash and dry with the warmest temperatures recommended on the label.  Clean all areas the individual has used often  Clean all touchable surfaces, such as counters, tabletops, doorknobs, bathroom fixtures, toilets, phones, keyboards, tablets, and bedside tables, every day. Also, clean any surfaces that may have blood, body fluids, and/or secretions or excretions on them.  Wear gloves when cleaning surfaces the patient has come in contact with.  Use a diluted bleach solution (e.g., dilute bleach with 1 part  bleach and 10 parts water) or a household disinfectant with a label that says EPA-registered for coronaviruses. To make a bleach solution at home, add 1 tablespoon of bleach to 1 quart (4 cups) of water. For a larger supply, add  cup of bleach to 1 gallon (16 cups) of water.  Read labels of cleaning products and follow recommendations provided on product labels. Labels contain instructions for safe and effective use of the cleaning product including precautions you should take when applying the product, such as wearing gloves or  eye protection and making sure you have good ventilation during use of the product.  Remove gloves and wash hands immediately after cleaning.  Monitor yourself for signs and symptoms of illness Caregivers and household members are considered close contacts, should monitor their health, and will be asked to limit movement outside of the home to the extent possible. Follow the monitoring steps for close contacts listed on the symptom monitoring form.   ? If you have additional questions, contact your local health department or call the epidemiologist on call at [phone] (available 24/7). ? This guidance is subject to change. For the most up-to-date guidance from [facility], please refer to their website: YouBlogs.pl

## 2018-08-23 NOTE — Telephone Encounter (Signed)
Yes, please.

## 2018-08-24 ENCOUNTER — Encounter: Payer: Self-pay | Admitting: Family Medicine

## 2018-08-26 LAB — SARS-COV-2 RNA,(COVID-19) QUALITATIVE NAAT: SARS CoV2 RNA: DETECTED — CR

## 2018-08-27 ENCOUNTER — Other Ambulatory Visit: Payer: Self-pay | Admitting: *Deleted

## 2018-08-27 ENCOUNTER — Telehealth: Payer: Self-pay

## 2018-08-27 ENCOUNTER — Encounter: Payer: Self-pay | Admitting: Osteopathic Medicine

## 2018-08-27 NOTE — Progress Notes (Signed)
Received call 08/27/18 at 12:33 am for critical lab positive SARSCOV2 testing. Acknowledged. Will d/w PCP in the morning.

## 2018-08-27 NOTE — Telephone Encounter (Signed)
[name],  Candace Greene left a message requesting the COVID-19 results to be faxed to her employer. She would like a call back when completed.   Fax [phone]

## 2018-08-27 NOTE — Telephone Encounter (Signed)
Results faxed, pt advised

## 2018-08-31 ENCOUNTER — Telehealth: Payer: Self-pay | Admitting: *Deleted

## 2018-08-31 ENCOUNTER — Encounter: Payer: Self-pay | Admitting: Family Medicine

## 2018-08-31 NOTE — Addendum Note (Signed)
Addended by: Beatrice Lecher D on: 08/31/2018 08:21 AM   Modules accepted: Level of Service

## 2018-08-31 NOTE — Telephone Encounter (Signed)
Form completed,faxed,confirmation received and scanned into patient's chart..Tahjay Binion Lynetta, CMA  

## 2018-09-03 ENCOUNTER — Telehealth: Payer: Self-pay

## 2018-09-03 MED ORDER — BUTALBITAL-APAP-CAFFEINE 50-325-40 MG PO TABS: 1.0000 | ORAL_TABLET | Freq: Four times a day (QID) | ORAL | 0 refills | Status: DC | PRN

## 2018-09-03 NOTE — Telephone Encounter (Signed)
Patient called stating that since she has been diagnosed, she has had really bad headaches. Originated on left side and now they are more on the right side. Has been taking some Tylenol, Mucinex, and other OTC mediations but no relief. States the other night she took 6 Tylenol before going to bed and still woke up with the headache. States she has to just turn everything off and go to sleep. Headaches are occurring every day.Requesting some medication to be sent in to help with headaches.

## 2018-09-03 NOTE — Telephone Encounter (Signed)
Pt advised.

## 2018-09-03 NOTE — Telephone Encounter (Signed)
Okay, prescription sent for Fioricet to her pharmacy.  If that still not helping then let us know.

## 2018-09-07 ENCOUNTER — Telehealth: Payer: Self-pay | Admitting: Family Medicine

## 2018-09-07 ENCOUNTER — Encounter: Payer: Self-pay | Admitting: *Deleted

## 2018-09-07 NOTE — Telephone Encounter (Signed)
Letter written for her to be out until 09/17/2018.Candace Greene, Candace Greene will fax letter for pt.Candace Greene, Candace Greene

## 2018-09-07 NOTE — Telephone Encounter (Signed)
Pt called and wanted Dr.Metheney to know that [employer] does not write work notes but the lady she has been in contact with at the [employer] Dept is Candace Greene and her number is [phone] Candace Greene informed pt that she will need a note from her PCP to stay home for ALL next week at this moment because it has not been 14 days yet and then pt has to be symptom free for 3 days AFTER the 14 days of quarintine.

## 2018-09-10 ENCOUNTER — Telehealth: Payer: Self-pay | Admitting: Family Medicine

## 2018-09-10 NOTE — Telephone Encounter (Signed)
Unable to lvm. Pt's letter was faxed and confirmation was received .Marland KitchenElouise Greene, [place]

## 2018-09-10 NOTE — Telephone Encounter (Signed)
Pt called. She's on leave for COVID 19 and wants to know if note for her job was sent. Thanks.

## 2018-09-10 NOTE — Telephone Encounter (Signed)
Ok thank you..  I will try to reach her later.

## 2018-11-07 ENCOUNTER — Ambulatory Visit (INDEPENDENT_AMBULATORY_CARE_PROVIDER_SITE_OTHER): Payer: BC Managed Care – PPO | Admitting: Physician Assistant

## 2018-11-07 ENCOUNTER — Other Ambulatory Visit: Payer: BC Managed Care – PPO

## 2018-11-07 ENCOUNTER — Encounter: Payer: Self-pay | Admitting: Physician Assistant

## 2018-11-07 VITALS — Ht 62.0 in | Wt 202.0 lb

## 2018-11-07 DIAGNOSIS — Z86711 Personal history of pulmonary embolism: Secondary | ICD-10-CM

## 2018-11-07 DIAGNOSIS — N921 Excessive and frequent menstruation with irregular cycle: Secondary | ICD-10-CM | POA: Diagnosis not present

## 2018-11-07 DIAGNOSIS — D259 Leiomyoma of uterus, unspecified: Secondary | ICD-10-CM | POA: Diagnosis not present

## 2018-11-07 MED ORDER — MEGESTROL ACETATE 40 MG PO TABS: ORAL_TABLET | ORAL | 0 refills | Status: DC

## 2018-11-07 NOTE — Progress Notes (Signed)
Used to have heavy bleeding during periods when she was on blood thinners, but that was a long time ago. This is first time in a long time having heavy bleeding, feels clots coming out. Has used 8 pads today.

## 2018-11-07 NOTE — Progress Notes (Signed)
Patient ID: Candace Greene, female   DOB: 1971-04-22, 48 y.o.   MRN: [id number] .Marland KitchenVirtual Visit via Video Note  I connected with Candace Greene on 11/07/18 at  1:00 PM EDT by a video enabled telemedicine application and verified that I am speaking with the correct person using two identifiers.  Location: Patient: car Provider: clinic   I discussed the limitations of evaluation and management by telemedicine and the availability of in person appointments. The patient expressed understanding and agreed to proceed.  History of Present Illness: Pt is a 48 yo female with hx of PE on estrogen, uterine fibroid who calls in with heavy vaginal bleeding with clots. She is really concerned about developing another clot. She denies any leg or chest pain. No SOB. No cough. She is really concerned because "clots are running down her leg". She had some abdominal cramping when period first started but no abnormal pain. She had some bleeding issues around her period on blood thinners but has been off for some time now. She has used 8 pads today and had to leave work early. No recent surgery, injuries, med changes.   .. Active Ambulatory Problems    Diagnosis Date Noted  . Wrist tendonitis 11/28/2014  . History of pulmonary embolism 04/17/2015  . Abnormal echocardiogram 05/06/2015  . Elevated lipase 06/15/2015  . Generalized abdominal pain 06/15/2015  . Ovarian cyst 06/16/2015  . Uterine fibroid 06/16/2015  . Pica 09/01/2015  . DUB (dysfunctional uterine bleeding) 09/01/2015  . Iron deficiency anemia 09/01/2015  . Menorrhagia with irregular cycle 09/01/2015  . Fatty liver 11/04/2015  . Right knee pain 06/27/2016  . Cubital tunnel syndrome 05/11/2017   Resolved Ambulatory Problems    Diagnosis Date Noted  . KNEE PAIN 03/16/2010  . TOE PAIN 03/16/2010  . Irregular bleeding 11/28/2014  . Acute pulmonary embolism ([place]) 04/17/2015  . Chest pain 05/06/2015  . Atypical chest pain 06/10/2015  .  Epigastric abdominal tenderness on direct palpation 06/10/2015  . Abdominal guarding 06/15/2015  . Fibroid, uterine 09/01/2015  . Right foot pain 08/09/2016  . Positive Murphy's Sign 05/08/2017  . Carpal tunnel syndrome 05/11/2017  . Pneumonia of left lower lobe due to infectious organism [facility]) 09/12/2017   Past Medical History:  Diagnosis Date  . Heel spur   . Pulmonary embolus ([facility])    Reviewed med, allergies, problem list.   Observations/Objective: No acute distress.  Normal breathing.  Normal mood.   .. Today's Vitals   11/07/18 1134  Weight: 202 lb (91.6 kg)  Height: 5\' 2"  (1.575 m)   Body mass index is 36.95 kg/m.    Assessment and Plan: .Marland KitchenJosette was seen today for menstrual problem.  Diagnoses and all orders for this visit:  Menorrhagia with irregular cycle -     CBC with Differential/Platelet -     Ferritin -     D-Dimer, Quantitative -     TSH -     US PELVIS (TRANSABDOMINAL ONLY) -     US PELVIS TRANSVAGINAL NON-OB (TV ONLY) -     megestrol (MEGACE) 40 MG tablet; Take twice a day for 3 days then daily for 7 days or until bleeding stops.  Uterine leiomyoma, unspecified location -     US PELVIS (TRANSABDOMINAL ONLY) -     US PELVIS TRANSVAGINAL NON-OB (TV ONLY) -     megestrol (MEGACE) 40 MG tablet; Take twice a day for 3 days then daily for 7 days or until bleeding stops.  History  of pulmonary embolism   Pt is concerned about PE. We have done serial d-dimers to check for any major elevations in the past. Will order one today. She denies any SOB, leg pain, CP, leg swelling. Pt is not on any medications that would increase chances of PE. I suspect her bleeding is due to fibroid. Will order U/S for follow up. Will given megace to stop bleeding. She can take baby ASA to thin blood just a little and help with any clotting formation. Labs ordered to look at hemoglobin and TSH. Follow up in 1 week with PcP.   Follow Up Instructions:    I discussed the  assessment and treatment plan with the patient. The patient was provided an opportunity to ask questions and all were answered. The patient agreed with the plan and demonstrated an understanding of the instructions.   The patient was advised to call back or seek an in-person evaluation if the symptoms worsen or if the condition fails to improve as anticipated.     Iran Planas, PA-C

## 2018-11-16 ENCOUNTER — Other Ambulatory Visit: Payer: Self-pay

## 2018-11-16 ENCOUNTER — Ambulatory Visit (INDEPENDENT_AMBULATORY_CARE_PROVIDER_SITE_OTHER): Payer: BC Managed Care – PPO

## 2018-11-16 DIAGNOSIS — D259 Leiomyoma of uterus, unspecified: Secondary | ICD-10-CM

## 2018-11-16 DIAGNOSIS — N921 Excessive and frequent menstruation with irregular cycle: Secondary | ICD-10-CM

## 2018-11-17 LAB — CBC WITH DIFFERENTIAL/PLATELET
Absolute Monocytes: 322 cells/uL (ref 200–950)
Basophils Absolute: 19 cells/uL (ref 0–200)
Basophils Relative: 0.4 %
Eosinophils Absolute: 62 cells/uL (ref 15–500)
Eosinophils Relative: 1.3 %
HCT: 32.4 % — ABNORMAL LOW (ref 35.0–45.0)
Hemoglobin: 11.2 g/dL — ABNORMAL LOW (ref 11.7–15.5)
Lymphs Abs: 2386 cells/uL (ref 850–3900)
MCH: 33.1 pg — ABNORMAL HIGH (ref 27.0–33.0)
MCHC: 34.6 g/dL (ref 32.0–36.0)
MCV: 95.9 fL (ref 80.0–100.0)
MPV: 11.7 fL (ref 7.5–12.5)
Monocytes Relative: 6.7 %
Neutro Abs: 2011 cells/uL (ref 1500–7800)
Neutrophils Relative %: 41.9 %
Platelets: 274 10*3/uL (ref 140–400)
RBC: 3.38 10*6/uL — ABNORMAL LOW (ref 3.80–5.10)
RDW: 14.1 % (ref 11.0–15.0)
Total Lymphocyte: 49.7 %
WBC: 4.8 10*3/uL (ref 3.8–10.8)

## 2018-11-17 LAB — D-DIMER, QUANTITATIVE: D-Dimer, Quant: 0.73 mcg/mL FEU — ABNORMAL HIGH (ref <0.50)

## 2018-11-17 LAB — FERRITIN: Ferritin: 14 ng/mL — ABNORMAL LOW (ref 16–232)

## 2018-11-17 LAB — TSH: TSH: 1.54 mIU/L

## 2018-11-17 NOTE — Progress Notes (Signed)
Got a call from [facility] but not concerning elevation of d-dimer actually lower than usual. Iron stores are low and hemoglobin low. How much iron are you taking? Thyroid looks good.

## 2018-11-19 NOTE — Progress Notes (Signed)
Is patient still bleeding? Multiple fibroids found. Since having issues with bleeding suggest GYN referral for surgery. Do you have a GYN?

## 2018-11-20 ENCOUNTER — Other Ambulatory Visit: Payer: Self-pay | Admitting: Neurology

## 2018-11-20 DIAGNOSIS — D219 Benign neoplasm of connective and other soft tissue, unspecified: Secondary | ICD-10-CM

## 2018-11-20 NOTE — Progress Notes (Signed)
Done

## 2018-11-20 NOTE — Addendum Note (Signed)
Addended by: Donella Stade on: 11/20/2018 03:03 PM   Modules accepted: Orders

## 2018-11-21 ENCOUNTER — Telehealth: Payer: Self-pay

## 2018-11-21 DIAGNOSIS — R928 Other abnormal and inconclusive findings on diagnostic imaging of breast: Secondary | ICD-10-CM

## 2018-11-21 NOTE — Telephone Encounter (Signed)
Candace Greene is calling to have a follow up Bilateral Diagnostic Mammogram and a left breast ultra sound.

## 2018-11-21 NOTE — Telephone Encounter (Signed)
Ok to place orders

## 2018-11-22 ENCOUNTER — Telehealth: Payer: Self-pay | Admitting: Family Medicine

## 2018-11-22 NOTE — Telephone Encounter (Signed)
Referral sent to [facility] on 7/28 -cf

## 2018-11-22 NOTE — Telephone Encounter (Signed)
lvm advising pt of referral being placed.Candace Greene, [place]

## 2018-11-22 NOTE — Telephone Encounter (Signed)
She already has referral placed 2 days ago. We may need to call and check

## 2018-11-22 NOTE — Telephone Encounter (Signed)
Pt called. She needs referral to OB/GYN for her fibroids.Thanks.

## 2018-11-22 NOTE — Telephone Encounter (Signed)
Thanks

## 2018-11-22 NOTE — Telephone Encounter (Signed)
Orders placed. Left message advising.

## 2018-11-30 ENCOUNTER — Other Ambulatory Visit: Payer: Self-pay | Admitting: Family Medicine

## 2018-11-30 ENCOUNTER — Ambulatory Visit
Admission: RE | Admit: 2018-11-30 | Discharge: 2018-11-30 | Disposition: A | Discharge status: Home / Self Care | Payer: BC Managed Care – PPO | Source: Ambulatory Visit | Attending: Family Medicine | Admitting: Family Medicine

## 2018-11-30 ENCOUNTER — Other Ambulatory Visit: Payer: Self-pay

## 2018-11-30 DIAGNOSIS — R928 Other abnormal and inconclusive findings on diagnostic imaging of breast: Secondary | ICD-10-CM

## 2018-11-30 DIAGNOSIS — N6320 Unspecified lump in the left breast, unspecified quadrant: Secondary | ICD-10-CM

## 2019-04-01 ENCOUNTER — Ambulatory Visit (INDEPENDENT_AMBULATORY_CARE_PROVIDER_SITE_OTHER): Payer: BC Managed Care – PPO | Admitting: Sports Medicine

## 2019-04-01 ENCOUNTER — Ambulatory Visit (INDEPENDENT_AMBULATORY_CARE_PROVIDER_SITE_OTHER): Payer: BC Managed Care – PPO

## 2019-04-01 ENCOUNTER — Encounter: Payer: Self-pay | Admitting: Physician Assistant

## 2019-04-01 ENCOUNTER — Ambulatory Visit (INDEPENDENT_AMBULATORY_CARE_PROVIDER_SITE_OTHER): Payer: BC Managed Care – PPO | Admitting: Physician Assistant

## 2019-04-01 ENCOUNTER — Encounter: Payer: Self-pay | Admitting: Sports Medicine

## 2019-04-01 ENCOUNTER — Other Ambulatory Visit: Payer: Self-pay

## 2019-04-01 VITALS — BP 144/53 | HR 48 | Temp 98.1°F | Wt 199.0 lb

## 2019-04-01 DIAGNOSIS — S66901A Unspecified injury of unspecified muscle, fascia and tendon at wrist and hand level, right hand, initial encounter: Secondary | ICD-10-CM

## 2019-04-01 DIAGNOSIS — M67431 Ganglion, right wrist: Secondary | ICD-10-CM | POA: Diagnosis not present

## 2019-04-01 DIAGNOSIS — M66231 Spontaneous rupture of extensor tendons, right forearm: Secondary | ICD-10-CM

## 2019-04-01 DIAGNOSIS — M66241 Spontaneous rupture of extensor tendons, right hand: Secondary | ICD-10-CM

## 2019-04-01 DIAGNOSIS — L304 Erythema intertrigo: Secondary | ICD-10-CM | POA: Diagnosis not present

## 2019-04-01 MED ORDER — MELOXICAM 15 MG PO TABS: ORAL_TABLET | ORAL | 3 refills | Status: DC

## 2019-04-01 MED ORDER — NAPROXEN 500 MG PO TABS: 500.0000 mg | ORAL_TABLET | Freq: Two times a day (BID) | ORAL | 0 refills | Status: DC | PRN

## 2019-04-01 MED ORDER — NYSTATIN 100000 UNIT/GM EX POWD: Freq: Three times a day (TID) | CUTANEOUS | 0 refills | Status: DC

## 2019-04-01 NOTE — Progress Notes (Signed)
HPI:                                                                Candace Greene is a 48 y.o. 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   Past Medical History:  Diagnosis Date  . Heel spur    right  . Pulmonary embolus [facility])    Past Surgical History:  Procedure Laterality Date  . No prior surgery     Social History   Tobacco Use  . Smoking status: Never Smoker  . Smokeless tobacco: Never Used  Substance Use Topics  . Alcohol use: Yes    Alcohol/week: 0.0 standard drinks    Comment: 3 glasses wine per night   family history includes Birth defects in an other family member; Breast cancer in her maternal aunt and maternal grandmother; Cancer in an other family member; Heart attack in her father; Hyperlipidemia in an other family member; Stroke in her father.    ROS: negative except as noted in the HPI  Medications: Current Outpatient Medications  Medication Sig Dispense Refill  . Aspirin-Acetaminophen-Caffeine (EXCEDRIN PO) Take by mouth.    . butalbital-acetaminophen-caffeine (FIORICET) 50-325-40 MG tablet Take 1-2 tablets by mouth every 6 (six) hours as needed for headache. 20 tablet 0  . cyclobenzaprine (FLEXERIL) 10 MG tablet Take 1 tablet (10 mg total) by mouth at bedtime as needed for muscle spasms. 30 tablet 0  . naproxen (NAPROSYN) 500 MG tablet Take 1 tablet (500 mg total) by mouth 2 (two) times daily as needed for moderate pain  (swelling). 14 tablet 0  . nystatin (MYCOSTATIN/NYSTOP) powder Apply topically 3 (three) times daily. 30 g 0   No current facility-administered medications for this visit.    No Known Allergies     Objective:  BP (!) 144/53   Pulse (!) 48   Temp 98.1 F (36.7 C) (Oral)   Wt 199 lb (90.3 kg)   BMI 36.40 kg/m  Gen:  alert, not ill-appearing, no distress, appropriate for age 61: Normal work of breathing, normal phonation Neuro: alert and oriented x 3, no tremor MSK: extremities atraumatic, normal gait and station Right hand: swelling of the dorsum with loss of landmarks, palpable nodule/ganglion of dorsal wrist, tenderness along the 3rd extensor tendon with weakness of extension again resistance Skin: slight hyperpigmentation of inframammary area   No results found for this or any previous visit (from the past 72 hour(s)). No results found.    Assessment and Plan: 48 y.o. female with   .Candace Greene was seen today for hand pain.  Diagnoses and all orders for this visit:  Injury of extensor tendon of right hand, initial encounter -     DG  Hand Complete Right -     naproxen (NAPROSYN) 500 MG tablet; Take 1 tablet (500 mg total) by mouth 2 (two) times daily as needed for moderate pain (swelling).  Ganglion cyst of dorsum of right wrist  Intertrigo -     nystatin (MYCOSTATIN/NYSTOP) powder; Apply topically 3 (three) times daily.   Atraumatic R hand swelling/pain Suspect tendonitis due to overuse injury Concern for injury/compression of 3rd extensor tendon X-ray pending Naproxen 500 mg bid prn pain Appt with Sports Medicine today at 3:15 pm  Patient education and anticipatory guidance given Patient agrees with treatment plan   Darlyne Russian PA-C

## 2019-04-01 NOTE — Assessment & Plan Note (Addendum)
Weakness to extension at the third metacarpophalangeal joint, I can also see what appears to be a rupture of the extensor digitorum tendon to the third digit. Adding meloxicam for pain. X-rays unremarkable, proceeding to MRI of the hand without contrast.  There is an extensive longitudinal tear of the extensor tendon to the third finger, because this is more of a longitudinal tear rather than a retracted transverse tear we are going to try and remove some of the fluid from the tendon and inject a small amount of anti-inflammatory followed by strapping and splinting. I like to see her back today if she is able.

## 2019-04-01 NOTE — Progress Notes (Addendum)
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  Couple of days ago she felt pain and swelling and had some weakness in her right hand, third digit.  She is referred to me for further evaluation and definitive treatment.  Pain is localized without radiation.  I reviewed the past medical history, family history, social history, surgical history, and allergies today and no changes were needed.  Please see the problem list section below in epic for further details.  Past Medical History: Past Medical History:  Diagnosis Date  . Heel spur    right  . Pulmonary embolus [facility])    Past Surgical History: Past Surgical History:  Procedure Laterality Date  . No prior surgery     Social History: Social History   Socioeconomic History  . Marital status: Married    Spouse name: Not on file  . Number of children: 2  . Years of education: Not on file  . Highest education level: Not on file  Occupational History    Comment: [employer]  . Financial resource strain: Not on file  . Food insecurity    Worry: Not on file    Inability: Not on file  . Transportation needs    Medical: Not on file    Non-medical: Not on file  Tobacco Use  . Smoking status: Never Smoker  . Smokeless tobacco: Never Used  Substance and Sexual Activity  . Alcohol use: Yes    Alcohol/week: 0.0 standard drinks    Comment: 3 glasses wine per night  . Drug use: No  . Sexual activity: Not on file  Lifestyle  . Physical activity    Days per week: Not on file    Minutes per session: Not on file  . Stress: Not on file  Relationships  . Social Herbalist on phone: Not on file    Gets together: Not on file    Attends religious service: Not on file    Active member of club or organization: Not on file   Attends meetings of clubs or organizations: Not on file    Relationship status: Not on file  Other Topics Concern  . Not on file  Social History Narrative  . Not on file   Family History: Family History  Problem Relation Age of Onset  . Stroke Father        <60  . Heart attack Father   . Birth defects Other   . Hyperlipidemia Other   . Breast cancer Maternal Grandmother   . Breast cancer Maternal Aunt   . Cancer Other        GF   Allergies: No Known Allergies Medications: See med rec.  Review of Systems: No headache, visual changes, nausea, vomiting, diarrhea, constipation, dizziness, abdominal pain, skin rash, fevers, chills, night sweats, weight loss, swollen lymph nodes, body aches, joint swelling, muscle aches, chest pain, shortness of breath, mood changes, visual or auditory hallucinations.   Objective:   General: Well Developed, well nourished, and in no acute distress.  Neuro:  Extra-ocular muscles intact, able to move all 4 extremities, sensation grossly intact.  Deep tendon reflexes tested were normal. Psych: Alert and oriented, mood congruent with affect. ENT:  Ears and nose appear unremarkable.  Hearing grossly normal. Neck: Unremarkable overall  appearance, trachea midline.  No visible thyroid enlargement. Eyes: Conjunctivae and lids appear unremarkable.  Pupils equal and round. Skin: Warm and dry, no rashes noted.  Cardiovascular: Pulses palpable, no extremity edema. Right hand: Swollen, tender to palpation over the dorsum of the hand with palpable and movable nodule at the level of the carpometacarpal joint.  She has weakness to extension at the MCP but good extension at the PIP and DIP.  Procedure: Diagnostic Ultrasound of right hand Device: Samsung HS60  Findings: Hypoechoic change and heterogenous appearance of the extensor digitorum communis tendon to the third digit. Images permanently stored and available for review in the ultrasound unit.  Impression:  Suspected spontaneous rupture of the extensor digitorum communis tendon to the third digit  Impression and Recommendations:   This case required medical decision making of moderate complexity.  Nontraumatic rupture of extensor tendons of hand and wrist, right Weakness to extension at the third metacarpophalangeal joint, I can also see what appears to be a rupture of the extensor digitorum tendon to the third digit. Adding meloxicam for pain. X-rays unremarkable, proceeding to MRI of the hand without contrast.  There is an extensive longitudinal tear of the extensor tendon to the third finger, because this is more of a longitudinal tear rather than a retracted transverse tear we are going to try and remove some of the fluid from the tendon and inject a small amount of anti-inflammatory followed by strapping and splinting. I like to see her back today if she is able.   ___________________________________________ Gwen Her. Dianah Field, M.D., ABFM., CAQSM. Primary Care and Sports Medicine [facility]  Adjunct Professor of [place] [facility] [employer]

## 2019-04-03 ENCOUNTER — Other Ambulatory Visit: Payer: Self-pay

## 2019-04-03 ENCOUNTER — Ambulatory Visit (INDEPENDENT_AMBULATORY_CARE_PROVIDER_SITE_OTHER): Payer: BC Managed Care – PPO | Admitting: Sports Medicine

## 2019-04-03 ENCOUNTER — Encounter: Payer: Self-pay | Admitting: Sports Medicine

## 2019-04-03 DIAGNOSIS — M66231 Spontaneous rupture of extensor tendons, right forearm: Secondary | ICD-10-CM | POA: Diagnosis not present

## 2019-04-03 DIAGNOSIS — M66241 Spontaneous rupture of extensor tendons, right hand: Secondary | ICD-10-CM | POA: Diagnosis not present

## 2019-04-03 NOTE — Progress Notes (Signed)
Subjective:    CC: Right hand swelling  HPI: This is a pleasant 48 year old female, we treated her for right hand swelling, she had a longitudinal split tear with cystic change in the third extensor tendon digitorum communis tendon to the third finger.  Because this was not a transverse retracted tear we brought her back to consider interventional treatment.  I reviewed the past medical history, family history, social history, surgical history, and allergies today and no changes were needed.  Please see the problem list section below in epic for further details.  Past Medical History: Past Medical History:  Diagnosis Date  . Heel spur    right  . Pulmonary embolus [facility])    Past Surgical History: Past Surgical History:  Procedure Laterality Date  . No prior surgery     Social History: Social History   Socioeconomic History  . Marital status: Married    Spouse name: Not on file  . Number of children: 2  . Years of education: Not on file  . Highest education level: Not on file  Occupational History    Comment: [employer]  . Financial resource strain: Not on file  . Food insecurity    Worry: Not on file    Inability: Not on file  . Transportation needs    Medical: Not on file    Non-medical: Not on file  Tobacco Use  . Smoking status: Never Smoker  . Smokeless tobacco: Never Used  Substance and Sexual Activity  . Alcohol use: Yes    Alcohol/week: 0.0 standard drinks    Comment: 3 glasses wine per night  . Drug use: No  . Sexual activity: Not on file  Lifestyle  . Physical activity    Days per week: Not on file    Minutes per session: Not on file  . Stress: Not on file  Relationships  . Social Herbalist on phone: Not on file    Gets together: Not on file    Attends religious service: Not on file    Active member of club or organization: Not on file    Attends meetings of clubs or organizations: Not on file    Relationship status: Not on  file  Other Topics Concern  . Not on file  Social History Narrative  . Not on file   Family History: Family History  Problem Relation Age of Onset  . Stroke Father        <60  . Heart attack Father   . Birth defects Other   . Hyperlipidemia Other   . Breast cancer Maternal Grandmother   . Breast cancer Maternal Aunt   . Cancer Other        GF   Allergies: No Known Allergies Medications: See med rec.  Review of Systems: No fevers, chills, night sweats, weight loss, chest pain, or shortness of breath.   Objective:    General: Well Developed, well nourished, and in no acute distress.  Neuro: Alert and oriented x3, extra-ocular muscles intact, sensation grossly intact.  HEENT: Normocephalic, atraumatic, pupils equal round reactive to light, neck supple, no masses, no lymphadenopathy, thyroid nonpalpable.  Skin: Warm and dry, no rashes. Cardiac: Regular rate and rhythm, no murmurs rubs or gallops, no lower extremity edema.  Respiratory: Clear to auscultation bilaterally. Not using accessory muscles, speaking in full sentences. Right hand: Swollen dorsally, still has some weakness to extension of the middle finger.  Procedure: Real-time Ultrasound Guided injection of  the right third extensor digitorum communis intratendinous cystic change. Device: Samsung HS60  Verbal informed consent obtained.  Time-out conducted.  Noted no overlying erythema, induration, or other signs of local infection.  Skin prepped in a sterile fashion.  Local anesthesia: Topical Ethyl chloride.  With sterile technique and under real time ultrasound guidance:  I advanced a 25-gauge needle into the cystic change inside the third extensor digitorum communis tendon and injected 1/2 cc Kenalog 40, 1/2 cc lidocaine  completed without difficulty  Pain immediately resolved suggesting accurate placement of the medication.  Advised to call if fevers/chills, erythema, induration, drainage, or persistent bleeding.   Images permanently stored and available for review in the ultrasound unit.  Impression: Technically successful ultrasound guided injection.  I then placed a volar static splint.  Impression and Recommendations:    Nontraumatic rupture of extensor tendons of hand and wrist, right MRI shows an extensive longitudinal tear of the extensor tendon to the third finger. There is a cystic component, this was injected, volar static splint placed. In a week at like her to start hand therapy. Continue left-handed duty only at work.   ___________________________________________ Gwen Her. Dianah Field, M.D., ABFM., CAQSM. Primary Care and Sports Medicine [facility]  Adjunct Professor of [place] [facility] [employer] Medicine

## 2019-04-03 NOTE — Assessment & Plan Note (Signed)
MRI shows an extensive longitudinal tear of the extensor tendon to the third finger. There is a cystic component, this was injected, volar static splint placed. In a week at like her to start hand therapy. Continue left-handed duty only at work.

## 2019-04-15 ENCOUNTER — Ambulatory Visit: Payer: BC Managed Care – PPO | Admitting: Family Medicine

## 2019-04-16 ENCOUNTER — Encounter: Payer: Self-pay | Admitting: Physical Therapy

## 2019-04-16 ENCOUNTER — Other Ambulatory Visit: Payer: Self-pay

## 2019-04-16 ENCOUNTER — Encounter: Payer: Self-pay | Admitting: Family Medicine

## 2019-04-16 ENCOUNTER — Ambulatory Visit (INDEPENDENT_AMBULATORY_CARE_PROVIDER_SITE_OTHER): Payer: BC Managed Care – PPO | Admitting: Family Medicine

## 2019-04-16 ENCOUNTER — Ambulatory Visit (INDEPENDENT_AMBULATORY_CARE_PROVIDER_SITE_OTHER): Payer: BC Managed Care – PPO | Admitting: Physical Therapy

## 2019-04-16 VITALS — BP 130/67 | HR 69 | Ht 62.0 in | Wt 198.0 lb

## 2019-04-16 DIAGNOSIS — M25531 Pain in right wrist: Secondary | ICD-10-CM

## 2019-04-16 DIAGNOSIS — M25631 Stiffness of right wrist, not elsewhere classified: Secondary | ICD-10-CM

## 2019-04-16 DIAGNOSIS — M66231 Spontaneous rupture of extensor tendons, right forearm: Secondary | ICD-10-CM | POA: Diagnosis not present

## 2019-04-16 DIAGNOSIS — M66241 Spontaneous rupture of extensor tendons, right hand: Secondary | ICD-10-CM | POA: Diagnosis not present

## 2019-04-16 DIAGNOSIS — R21 Rash and other nonspecific skin eruption: Secondary | ICD-10-CM

## 2019-04-16 DIAGNOSIS — M25541 Pain in joints of right hand: Secondary | ICD-10-CM

## 2019-04-16 DIAGNOSIS — M79644 Pain in right finger(s): Secondary | ICD-10-CM | POA: Diagnosis not present

## 2019-04-16 DIAGNOSIS — L304 Erythema intertrigo: Secondary | ICD-10-CM

## 2019-04-16 DIAGNOSIS — R600 Localized edema: Secondary | ICD-10-CM

## 2019-04-16 MED ORDER — MUPIROCIN 2 % EX OINT: TOPICAL_OINTMENT | Freq: Three times a day (TID) | CUTANEOUS | 0 refills | Status: DC | PRN

## 2019-04-16 MED ORDER — NYSTATIN 100000 UNIT/GM EX POWD: Freq: Three times a day (TID) | CUTANEOUS | 0 refills | Status: DC

## 2019-04-16 NOTE — Progress Notes (Signed)
Acute Office Visit  Subjective:    Patient ID: Candace Greene, female    DOB: 27-May-1970, 48 y.o.   MRN: [id number]  Chief Complaint  Patient presents with  . Rash    HPI Patient is in today for couple of pustules underneath the left breast that started about a week ago.  She occasionally gets moisture and yeast infections underneath her breast and had started using a topical nystatin powder but it was a different brand than what she typically uses and noticed that she developed a couple of pustules.  She says it was so painful and tender that she was not able to wear a bra for over a week.  They eventually drained after soaking in hot water.  She feels like they are actually healing but wanted to come in to make sure that it was okay.  She also has a finger splint on her right hand and accidentally stuck her hand in water and now the foam is peeling off. She was in PT today.  .   Past Medical History:  Diagnosis Date  . Heel spur    right  . Pulmonary embolus [facility])     Past Surgical History:  Procedure Laterality Date  . No prior surgery      Family History  Problem Relation Age of Onset  . Stroke Father        <60  . Heart attack Father   . Birth defects Other   . Hyperlipidemia Other   . Breast cancer Maternal Grandmother   . Breast cancer Maternal Aunt   . Cancer Other        GF    Social History   Socioeconomic History  . Marital status: Married    Spouse name: Not on file  . Number of children: 2  . Years of education: Not on file  . Highest education level: Not on file  Occupational History    Comment: [employer]  Tobacco Use  . Smoking status: Never Smoker  . Smokeless tobacco: Never Used  Substance and Sexual Activity  . Alcohol use: Yes    Alcohol/week: 0.0 standard drinks    Comment: 3 glasses wine per night  . Drug use: No  . Sexual activity: Not on file  Other Topics Concern  . Not on file  Social History Narrative  . Not on file   Social  Determinants of Health   Financial Resource Strain:   . Difficulty of Paying Living Expenses: Not on file  Food Insecurity:   . Worried About Charity fundraiser in the Last Year: Not on file  . Ran Out of Food in the Last Year: Not on file  Transportation Needs:   . Lack of Transportation (Medical): Not on file  . Lack of Transportation (Non-Medical): Not on file  Physical Activity:   . Days of Exercise per Week: Not on file  . Minutes of Exercise per Session: Not on file  Stress:   . Feeling of Stress : Not on file  Social Connections:   . Frequency of Communication with Friends and Family: Not on file  . Frequency of Social Gatherings with Friends and Family: Not on file  . Attends Religious Services: Not on file  . Active Member of Clubs or Organizations: Not on file  . Attends Archivist Meetings: Not on file  . Marital Status: Not on file  Intimate Partner Violence:   . Fear of Current or Ex-Partner: Not on  file  . Emotionally Abused: Not on file  . Physically Abused: Not on file  . Sexually Abused: Not on file    Outpatient Medications Prior to Visit  Medication Sig Dispense Refill  . Aspirin-Acetaminophen-Caffeine (EXCEDRIN PO) Take by mouth.    . butalbital-acetaminophen-caffeine (FIORICET) 50-325-40 MG tablet Take 1-2 tablets by mouth every 6 (six) hours as needed for headache. 20 tablet 0  . cyclobenzaprine (FLEXERIL) 10 MG tablet Take 1 tablet (10 mg total) by mouth at bedtime as needed for muscle spasms. 30 tablet 0  . meloxicam (MOBIC) 15 MG tablet One tab PO qAM with a meal for 2 weeks, then daily prn pain. 30 tablet 3  . naproxen (NAPROSYN) 500 MG tablet Take 1 tablet (500 mg total) by mouth 2 (two) times daily as needed for moderate pain (swelling). 14 tablet 0  . nystatin (MYCOSTATIN/NYSTOP) powder Apply topically 3 (three) times daily. 30 g 0   No facility-administered medications prior to visit.    No Known Allergies  Review of Systems      Objective:    Physical Exam Skin:    Comments: She had 2 papular lesions on the left breast just near the fold.  The skin was a little bit denuded but no surrounding erythema.  No active drainage.  Therefore are little bit papular.  Nontender on exam.     BP 130/67   Pulse 69   Ht 5\' 2"  (1.575 m)   Wt 198 lb (89.8 kg)   SpO2 100%   BMI 36.21 kg/m  Wt Readings from Last 3 Encounters:  04/16/19 198 lb (89.8 kg)  04/03/19 197 lb (89.4 kg)  04/01/19 199 lb (90.3 kg)    Health Maintenance Due  Topic Date Due  . PAP SMEAR-Modifier  05/03/2018    There are no preventive care reminders to display for this patient.   Lab Results  Component Value Date   TSH 1.54 11/16/2018   Lab Results  Component Value Date   WBC 4.8 11/16/2018   HGB 11.2 (L) 11/16/2018   HCT 32.4 (L) 11/16/2018   MCV 95.9 11/16/2018   PLT 274 11/16/2018   Lab Results  Component Value Date   NA 139 01/11/2018   K 4.0 01/11/2018   CO2 25 01/11/2018   GLUCOSE 92 01/11/2018   BUN 11 01/11/2018   CREATININE 1.04 01/11/2018   BILITOT 0.5 01/11/2018   ALKPHOS 44 12/29/2015   AST 18 01/11/2018   ALT 10 01/11/2018   PROT 7.1 01/11/2018   ALBUMIN 3.7 12/29/2015   CALCIUM 9.2 01/11/2018   ANIONGAP 14 04/18/2015   Lab Results  Component Value Date   CHOL 161 08/15/2013   Lab Results  Component Value Date   HDL 70 08/15/2013   Lab Results  Component Value Date   LDLCALC 78 08/15/2013   Lab Results  Component Value Date   TRIG 64 08/15/2013   Lab Results  Component Value Date   CHOLHDL 2.3 08/15/2013   No results found for: HGBA1C     Assessment & Plan:   Problem List Items Addressed This Visit      Musculoskeletal and Integument   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  But no surrounding erythema nothing requiring oral antibiotics.  Also will write a new prescription for the nystatin powder she like to go back to the old version which is by different manufacturer possible so new prescription sent today.  Finger injury - given new splint today until follow up with Dr. Darene Lamer. Continue with PT.     Meds ordered this encounter  Medications  . mupirocin ointment (BACTROBAN) 2 %    Sig: Apply topically 3 (three) times daily as needed. Apply to inside of each nares daily for 10 days then twice a week for maintenance.    Dispense:  30 g    Refill:  0  . nystatin (MYCOSTATIN/NYSTOP) powder    Sig: Apply topically 3 (three) times daily. Gavis pharmaceutical brand if possible.    Dispense:  60 g    Refill:  0     Beatrice Lecher, MD

## 2019-04-16 NOTE — Therapy (Signed)
[place], [place], [address] Phone: [phone]   Fax:  [phone]  Physical Therapy Evaluation  Patient Details  Name: Candace Greene MRN: [id number] Date of Birth: [birth year] Referring Provider (PT): Dianah Field MD   Encounter Date: 04/16/2019  PT End of Session - 04/16/19 1428    Visit Number  1    Number of Visits  6    Date for PT Re-Evaluation  06/12/19    PT Start Time  [id number]    PT Stop Time  [id number]    PT Time Calculation (min)  50 min    Activity Tolerance  Patient tolerated treatment well    Behavior During Therapy  [facility] for tasks assessed/performed       Past Medical History:  Diagnosis Date  . Heel spur    right  . Pulmonary embolus [facility])     Past Surgical History:  Procedure Laterality Date  . No prior surgery      There were no vitals filed for this visit.   Subjective Assessment - 04/16/19 1211    Subjective  Pt arriving to therapy s/p R    Patient Stated Goals  X-ray on 04/03/2019    Currently in Pain?  Yes    Pain Score  8     Pain Orientation  Right    Pain Descriptors / Indicators  Aching    Pain Type  Acute pain    Pain Onset  1 to 4 weeks ago    Pain Frequency  Intermittent    Aggravating Factors   sometimes pain just "comes"    Pain Relieving Factors  resting, pain meds         [facility] PT Assessment - 04/16/19 0001      Assessment   Medical Diagnosis  M66.241, M66.231 Non traumatic rupture of extensor tendon of hand and wrist,    Referring Provider (PT)  Thekkekandam MD    Hand Dominance  Right    Next MD Visit  05/01/2019    Prior Therapy  no      Precautions   Precautions  None    Precaution Comments  R extensor splint      Restrictions   Weight Bearing Restrictions  Yes    Other Position/Activity Restrictions  R extensor splint       Balance Screen   Has the patient fallen in the past 6 months  No    Is the patient reluctant to leave their  home because of a fear of falling?   No      Home Film/video editor residence      Prior Function   Level of Independence  Independent      Cognition   Overall Cognitive Status  Within Functional Limits for tasks assessed      Observation/Other Assessments-Edema    Edema  Circumferential      Circumferential Edema   Circumferential - Right  wrist: 19.0 centimeters    Circumferential - Left   wrist: 17.0 centimeters      Posture/Postural Control   Posture/Postural Control  Postural limitations    Postural Limitations  Rounded Shoulders;Forward head      ROM / Strength   AROM / PROM / Strength  AROM;Strength      AROM   Overall AROM   Deficits    AROM Assessment Site  Wrist;Finger    Right/Left Wrist  Right    Right  Wrist Extension  40 Degrees    Right Wrist Flexion  55 Degrees    Right Wrist Radial Deviation  25 Degrees    Right Wrist Ulnar Deviation  25 Degrees    Left Wrist Extension  60 Degrees    Left Wrist Flexion  70 Degrees    Left Wrist Radial Deviation  40 Degrees    Left Wrist Ulnar Deviation  30 Degrees    Right/Left Finger  Right    Right Composite Finger Extension  25%   function   Right Composite Finger Flexion  25%   function     Strength   Overall Strength  Unable to assess      Palpation   Palpation comment  TTP R dorsum of hand and wrist      Transfers   Five time sit to stand comments   WFL                Objective measurements completed on examination: See above findings.      [facility] Adult PT Treatment/Exercise - 04/16/19 0001      Manual Therapy   Manual Therapy  Soft tissue mobilization;Passive ROM    Manual therapy comments  ice massage while performing ROM    Passive ROM  finger flexion/extension, wrist flexion/extension             PT Education - 04/16/19 1428    Education Details  PT POC    Person(s) Educated  Patient    Methods  Explanation    Comprehension  Verbalized understanding        PT Short Term Goals - 04/16/19 1443      PT SHORT TERM GOAL #1   Title  Measure grip strength and create a LTG.    Baseline  unable to grasp to test strength    Status  New        PT Long Term Goals - 04/16/19 1429      PT LONG TERM GOAL #1   Title  Pt will be able to report pain free bathing and dressing using R UE.    Baseline  currently pt in extensor spint and unable to perform.    Time  6    Period  Weeks    Status  New    Target Date  05/29/19      PT LONG TERM GOAL #2   Title  pt will be able to improve her R 3rd digit extension to >/= 30 degrees to improve function.    Baseline  see flow sheet    Time  6    Period  Weeks    Status  New    Target Date  05/29/19      PT LONG TERM GOAL #3   Title  Pt will be able lift and grasp a 5 # weight,  lifting from counter height to above shoulder height.    Baseline  unable to currently    Time  6    Period  Weeks    Status  New    Target Date  05/29/19      PT LONG TERM GOAL #4   Title  pt will be able to bear weight with wrist extended without pain.    Baseline  unable to bear weight currently             Plan - 04/16/19 1451    Clinical Impression Statement  Pt presenting today with nontraumatic rupture of right  extensor tendons of hand and wrist in 3rd digit. Pt is right handed. Pt with extensor spint intact. Pt with limited strength and ROM. Skin intact after removing splint with no breakdown noted. Pt reporting pain of 7-8/10 intermittently. STW/ice massage performed while ROM was performed. Skilled PT needed to progress pt toward her PLOF with the below interventions.    Personal Factors and Comorbidities  Comorbidity 1    Comorbidities  wrist tendonitis, ganglion cyst dorsum of R wrist, abdnormal echocardiogram    Examination-Activity Limitations  Bathing;Dressing;Carry;Sleep;Toileting;Hygiene/Grooming    Examination-Participation Restrictions  Cleaning;Community Activity;Driving;Shop;Other     Stability/Clinical Decision Making  Stable/Uncomplicated    Clinical Decision Making  Low    Rehab Potential  Good    PT Frequency  1x / week    PT Duration  6 weeks    PT Treatment/Interventions  ADLs/Self Care Home Management;Cryotherapy;Ultrasound;Moist Heat;Iontophoresis 4mg /ml Dexamethasone;Electrical Stimulation;Functional mobility training;Therapeutic activities;Therapeutic exercise;Neuromuscular re-education;Patient/family education;Manual techniques;Passive range of motion;Taping    PT Next Visit Plan  Measure grip strength, ROM, ice massage    Consulted and Agree with Plan of Care  Patient       Patient will benefit from skilled therapeutic intervention in order to improve the following deficits and impairments:  Pain, Postural dysfunction, Decreased strength, Impaired UE functional use, Decreased range of motion, Increased edema  Visit Diagnosis: Pain in joints of right hand  Pain in right finger(s)  Pain in right wrist  Stiffness of right wrist, not elsewhere classified  Localized edema     Problem List Patient Active Problem List   Diagnosis Date Noted  . Ganglion cyst of dorsum of right wrist 04/01/2019  . Intertrigo 04/01/2019  . 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 Cubital tunnel syndrome 05/11/2017  . Right knee pain 06/27/2016  . Fatty liver 11/04/2015  . Pica 09/01/2015  . DUB (dysfunctional uterine bleeding) 09/01/2015  . Iron deficiency anemia 09/01/2015  . Menorrhagia with irregular cycle 09/01/2015  . Ovarian cyst 06/16/2015  . Uterine fibroid 06/16/2015  . Elevated lipase 06/15/2015  . Generalized abdominal pain 06/15/2015  . Abnormal echocardiogram 05/06/2015  . History of pulmonary embolism 04/17/2015  . Wrist tendonitis 11/28/2014    Oretha Caprice, PT 04/16/2019, 3:10 PM  [facility] [place], [place], [address] Phone:  [phone]   Fax:  [phone]  Name: Nicte Sorber MRN: [id number] Date of Birth: 14-Feb-1971

## 2019-04-24 ENCOUNTER — Encounter: Payer: BC Managed Care – PPO | Admitting: Physical Therapy

## 2019-05-03 ENCOUNTER — Encounter: Payer: Self-pay | Admitting: Physical Therapy

## 2019-05-03 ENCOUNTER — Ambulatory Visit (INDEPENDENT_AMBULATORY_CARE_PROVIDER_SITE_OTHER): Payer: BC Managed Care – PPO | Admitting: Sports Medicine

## 2019-05-03 ENCOUNTER — Other Ambulatory Visit: Payer: Self-pay

## 2019-05-03 ENCOUNTER — Ambulatory Visit (INDEPENDENT_AMBULATORY_CARE_PROVIDER_SITE_OTHER): Payer: BC Managed Care – PPO | Admitting: Physical Therapy

## 2019-05-03 DIAGNOSIS — M66241 Spontaneous rupture of extensor tendons, right hand: Secondary | ICD-10-CM

## 2019-05-03 DIAGNOSIS — M25541 Pain in joints of right hand: Secondary | ICD-10-CM

## 2019-05-03 DIAGNOSIS — M66231 Spontaneous rupture of extensor tendons, right forearm: Secondary | ICD-10-CM

## 2019-05-03 DIAGNOSIS — M79644 Pain in right finger(s): Secondary | ICD-10-CM

## 2019-05-03 DIAGNOSIS — R600 Localized edema: Secondary | ICD-10-CM

## 2019-05-03 DIAGNOSIS — M25531 Pain in right wrist: Secondary | ICD-10-CM | POA: Diagnosis not present

## 2019-05-03 DIAGNOSIS — M25631 Stiffness of right wrist, not elsewhere classified: Secondary | ICD-10-CM

## 2019-05-03 NOTE — Progress Notes (Signed)
    Procedures performed today:    None.  Independent interpretation of tests performed by another provider:   None.  Impression and Recommendations:    Nontraumatic rupture of extensor tendons of hand and wrist, right Marcee returns, she had a suspected tendon rupture to the extensor to the third finger. She had some weakness to extension. Ultimately an MRI showed more of a longitudinal split tear with a cystic component, we did an injection, swelling resolved, splinting has allowed her to keep her finger straight. She has done very well, pain is much better, strength is excellent, swelling is gone. I I am going to discontinue her extension splint today.    had like her to work hard in hand therapy for at least the next month, she will do light duty for another 2 weeks at work and then transition to full duty. Return to see me in a month.    ___________________________________________ Gwen Her. Dianah Field, M.D., ABFM., CAQSM. Primary Care and [place] Instructor of [place] [facility]

## 2019-05-03 NOTE — Patient Instructions (Signed)
   And finger waves

## 2019-05-03 NOTE — Therapy (Signed)
[place], [place], [address] Phone: [phone]   Fax:  [phone]  Physical Therapy Treatment  Patient Details  Name: Candace Greene MRN: [id number] Date of Birth: Oct 08, 1970 Referring Provider (PT): Dianah Field MD   Encounter Date: 05/03/2019  PT End of Session - 05/03/19 1013    Visit Number  2    Number of Visits  6    Date for PT Re-Evaluation  06/12/19    PT Start Time  1013    PT Stop Time  1043    PT Time Calculation (min)  30 min    Activity Tolerance  Patient tolerated treatment well    Behavior During Therapy  [facility] for tasks assessed/performed       Past Medical History:  Diagnosis Date  . Heel spur    right  . Pulmonary embolus [facility])     Past Surgical History:  Procedure Laterality Date  . No prior surgery      There were no vitals filed for this visit.  Subjective Assessment - 05/03/19 1014    Subjective  saw MD, doesnt need to wear brace anymore,  on light duty at work for another two weeks.    Currently in Pain?  --   no real pain per patient, only tightness.        [facility] PT Assessment - 05/03/19 0001      Assessment   Medical Diagnosis  M66.241, M66.231 Non traumatic rupture of extensor tendon of hand and wrist,    Referring Provider (PT)  Thekkekandam MD      Strength   Strength Assessment Site  Hand    Right/Left hand  Right;Left    Right Hand Grip (lbs)  25/30/28    Right Hand 3 Point Pinch  2 lbs    Left Hand Grip (lbs)  60/60/60/    Left Hand 3 Point Pinch  11 lbs                           PT Education - 05/03/19 1042    Education Details  HEP progression    Person(s) Educated  Patient    Methods  Explanation;Demonstration;Handout    Comprehension  Verbalized understanding       PT Short Term Goals - 04/16/19 1443      PT SHORT TERM GOAL #1   Title  Measure grip strength and create a LTG.    Baseline  unable to grasp  to test strength    Status  New        PT Long Term Goals - 04/16/19 1429      PT LONG TERM GOAL #1   Title  Pt will be able to report pain free bathing and dressing using R UE.    Baseline  currently pt in extensor spint and unable to perform.    Time  6    Period  Weeks    Status  New    Target Date  05/29/19      PT LONG TERM GOAL #2   Title  pt will be able to improve her R 3rd digit extension to >/= 30 degrees to improve function.    Baseline  see flow sheet    Time  6    Period  Weeks    Status  New    Target Date  05/29/19      PT LONG TERM GOAL #3  Title  Pt will be able lift and grasp a 5 # weight,  lifting from counter height to above shoulder height.    Baseline  unable to currently    Time  6    Period  Weeks    Status  New    Target Date  05/29/19      PT LONG TERM GOAL #4   Title  pt will be able to bear weight with wrist extended without pain.    Baseline  unable to bear weight currently            Plan - 05/03/19 1043    Clinical Impression Statement  Pt presents for tx after seeing MD, she no longer requires the brace.  Her Rt hand and fingers are weak per objective tests.  She was fatigued in the Rt middle finger after tx.  She is I with new HEP    PT Frequency  1x / week    PT Duration  6 weeks    PT Treatment/Interventions  ADLs/Self Care Home Management;Cryotherapy;Ultrasound;Moist Heat;Iontophoresis 4mg /ml Dexamethasone;Electrical Stimulation;Functional mobility training;Therapeutic activities;Therapeutic exercise;Neuromuscular re-education;Patient/family education;Manual techniques;Passive range of motion;Taping    PT Next Visit Plan  assess tolerance to new HEP and progress to return to work simulation    Consulted and Agree with Plan of Care  Patient       Patient will benefit from skilled therapeutic intervention in order to improve the following deficits and impairments:  Pain, Postural dysfunction, Decreased strength, Impaired UE  functional use, Decreased range of motion, Increased edema  Visit Diagnosis: Pain in joints of right hand  Pain in right finger(s)  Pain in right wrist  Stiffness of right wrist, not elsewhere classified  Localized edema     Problem List Patient Active Problem List   Diagnosis Date Noted  . Ganglion cyst of dorsum of right wrist 04/01/2019  . Intertrigo 04/01/2019  . 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 Cubital tunnel syndrome 05/11/2017  . Right knee pain 06/27/2016  . Fatty liver 11/04/2015  . Pica 09/01/2015  . DUB (dysfunctional uterine bleeding) 09/01/2015  . Iron deficiency anemia 09/01/2015  . Menorrhagia with irregular cycle 09/01/2015  . Ovarian cyst 06/16/2015  . Uterine fibroid 06/16/2015  . Elevated lipase 06/15/2015  . Generalized abdominal pain 06/15/2015  . Abnormal echocardiogram 05/06/2015  . History of pulmonary embolism 04/17/2015  . Wrist tendonitis 11/28/2014    Jeral Pinch PT  05/03/2019, 10:46 AM  [facility] [place], [place], [address] Phone: [phone]   Fax:  [phone]  Name: Candace Greene MRN: [id number] Date of Birth: [birth year]

## 2019-05-03 NOTE — Assessment & Plan Note (Addendum)
Candace Greene returns, she had a suspected tendon rupture to the extensor to the third finger. She had some weakness to extension. Ultimately an MRI showed more of a longitudinal split tear with a cystic component, we did an injection, swelling resolved, splinting has allowed her to keep her finger straight. She has done very well, pain is much better, strength is excellent, swelling is gone. I I am going to discontinue her extension splint today.    had like her to work hard in hand therapy for at least the next month, she will do light duty for another 2 weeks at work and then transition to full duty. Return to see me in a month.

## 2019-05-07 ENCOUNTER — Encounter: Payer: Self-pay | Admitting: Sports Medicine

## 2019-05-07 ENCOUNTER — Telehealth: Payer: Self-pay

## 2019-05-07 NOTE — Telephone Encounter (Signed)
Letter completed.

## 2019-05-07 NOTE — Telephone Encounter (Signed)
Candace Greene called and states her job is requesting more information on the work note. She states it needs to read she can only work to 25% of the normal work load. She will call back with the fax number.

## 2019-05-08 NOTE — Telephone Encounter (Signed)
[place] (1st attempt) and provide [place] with the fax number that we need to send the work note to.

## 2019-05-10 ENCOUNTER — Ambulatory Visit (INDEPENDENT_AMBULATORY_CARE_PROVIDER_SITE_OTHER): Payer: BC Managed Care – PPO | Admitting: Physical Therapy

## 2019-05-10 ENCOUNTER — Other Ambulatory Visit: Payer: Self-pay

## 2019-05-10 ENCOUNTER — Ambulatory Visit: Payer: BC Managed Care – PPO

## 2019-05-10 ENCOUNTER — Ambulatory Visit (INDEPENDENT_AMBULATORY_CARE_PROVIDER_SITE_OTHER): Payer: BC Managed Care – PPO | Admitting: Family Medicine

## 2019-05-10 DIAGNOSIS — M25631 Stiffness of right wrist, not elsewhere classified: Secondary | ICD-10-CM | POA: Diagnosis not present

## 2019-05-10 DIAGNOSIS — M79644 Pain in right finger(s): Secondary | ICD-10-CM | POA: Diagnosis not present

## 2019-05-10 DIAGNOSIS — M25531 Pain in right wrist: Secondary | ICD-10-CM | POA: Diagnosis not present

## 2019-05-10 DIAGNOSIS — M25541 Pain in joints of right hand: Secondary | ICD-10-CM | POA: Diagnosis not present

## 2019-05-10 DIAGNOSIS — Z23 Encounter for immunization: Secondary | ICD-10-CM

## 2019-05-10 DIAGNOSIS — R600 Localized edema: Secondary | ICD-10-CM

## 2019-05-10 NOTE — Therapy (Addendum)
[place], [place], [address] Phone: [phone]   Fax:  [phone]  Physical Therapy Treatment Discharge Summary  Patient Details  Name: Candace Greene MRN: [id number] Date of Birth: 10-07-1970 Referring Provider (PT): Dianah Field MD   Encounter Date: 05/10/2019  PT End of Session - 05/10/19 1239    Visit Number  3    Number of Visits  6    Date for PT Re-Evaluation  06/12/19    PT Start Time  7948    PT Stop Time  1216    PT Time Calculation (min)  31 min    Activity Tolerance  Patient tolerated treatment well       Past Medical History:  Diagnosis Date  . Heel spur    right  . Pulmonary embolus [facility])     Past Surgical History:  Procedure Laterality Date  . No prior surgery      There were no vitals filed for this visit.  Subjective Assessment - 05/10/19 1236    Subjective  Pt arriving to therapy reporting her work put her on full duty instead of light duty recommended by her MD last week. Pt feels like her strength has improved since being back to work. Pt did however report fatigue following her shift and pain over R extensor tendons. Pt reporting she has lots of errands to run today.    Patient Stated Goals  X-ray on 04/03/2019    Currently in Pain?  No/denies         [facility] PT Assessment - 05/10/19 0001      Assessment   Medical Diagnosis  M66.241, M66.231 Non traumatic rupture of extensor tendon of hand and wrist,    Referring Provider (PT)  Thekkekandam MD      Strength   Strength Assessment Site  Hand    Right/Left hand  Right;Left    Right Hand Grip (lbs)  35 ppsi   average of 3 trials   Right Hand 3 Point Pinch  13 lbs    Left Hand Grip (lbs)  40ppsi   average of 3 trials   Left Hand 3 Point Pinch  12 lbs      Palpation   Palpation comment  no tenderness noted with palpation                   [facility] Adult PT Treatment/Exercise - 05/10/19 0001      Exercises   Exercises  Hand      Hand Exercises   Large Pegboard  15 pegs on/off x 2 reps    Rubberbands  soft and medium firmness x 15 reps each, single digit and entire hand finger extension and flexion,     Other Hand Exercises  velcro rollers small, medium and large pincer grip x 2 reps each,     Other Hand Exercises  pronation supination with hammer, twisting using yellow theraband roller x 10 reps both directions      Manual Therapy   Manual Therapy  --    Passive ROM  finger flexion/extension, wrist flexion/extension             PT Education - 05/10/19 1239    Education Details  continue to perform HEP and stretching, ice as needed.    Person(s) Educated  Patient    Methods  Explanation    Comprehension  Verbalized understanding       PT Short Term Goals - 05/10/19 1250  PT SHORT TERM GOAL #1   Title  Measure grip strength and create a LTG.    Status  Achieved        PT Long Term Goals - 05/10/19 1251      PT LONG TERM GOAL #1   Title  Pt will be able to report pain free bathing and dressing using R UE.    Baseline  pt able to report bathing and dressing without pain    Time  6    Period  Weeks    Status  Achieved      PT LONG TERM GOAL #2   Title  pt will be able to improve her R 3rd digit extension to >/= 30 degrees to improve function.    Baseline  see flow sheet    Time  6    Period  Weeks    Status  On-going      PT LONG TERM GOAL #3   Title  Pt will be able lift and grasp a 5 # weight,  lifting from counter height to above shoulder height.    Baseline  lifting 3#    Status  On-going      PT LONG TERM GOAL #4   Title  pt will be able to bear weight with wrist extended without pain.    Status  New            Plan - 05/10/19 1240    Clinical Impression Statement  Pt presenting today reporting lots of errands to run. Pt reporting no pain in her R wrist/hand at rest. Pt did report she has been operating at 100% at work even though  her MD put her at 25% light duty for 2 weeks from last visit. Pt reporting she is going back to the MD to get another note for work. Pt making great progress with strength and ROM. Pt instructed to follow up in two weeks with therapy and then possible discharge.    Personal Factors and Comorbidities  Comorbidity 1    Comorbidities  wrist tendonitis, ganglion cyst dorsum of R wrist, abdnormal echocardiogram    Examination-Activity Limitations  Bathing;Dressing;Carry;Sleep;Toileting;Hygiene/Grooming    Examination-Participation Restrictions  Cleaning;Community Activity;Driving;Shop;Other    Stability/Clinical Decision Making  Stable/Uncomplicated    Rehab Potential  Good    PT Frequency  1x / week    PT Duration  6 weeks    PT Treatment/Interventions  ADLs/Self Care Home Management;Cryotherapy;Ultrasound;Moist Heat;Iontophoresis 4m/ml Dexamethasone;Electrical Stimulation;Functional mobility training;Therapeutic activities;Therapeutic exercise;Neuromuscular re-education;Patient/family education;Manual techniques;Passive range of motion;Taping    PT Next Visit Plan  assess tolerance to new HEP and progress to return to work simulation    Consulted and Agree with Plan of Care  Patient       Patient will benefit from skilled therapeutic intervention in order to improve the following deficits and impairments:  Pain, Postural dysfunction, Decreased strength, Impaired UE functional use, Decreased range of motion, Increased edema  Visit Diagnosis: Pain in joints of right hand  Pain in right finger(s)  Pain in right wrist  Stiffness of right wrist, not elsewhere classified  Localized edema  PHYSICAL THERAPY DISCHARGE SUMMARY  Visits from Start of Care: 3  Current functional level related to goals / functional outcomes: see above    Remaining deficits: see above    Education / Equipment: HEP  Plan: Patient agrees to discharge.  Patient goals were partially met. Patient is being  discharged due to being pleased with the current functional level.  ?????  Problem List Patient Active Problem List   Diagnosis Date Noted  . Ganglion cyst of dorsum of right wrist 04/01/2019  . Intertrigo 04/01/2019  . 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 Cubital tunnel syndrome 05/11/2017  . Right knee pain 06/27/2016  . Fatty liver 11/04/2015  . Pica 09/01/2015  . DUB (dysfunctional uterine bleeding) 09/01/2015  . Iron deficiency anemia 09/01/2015  . Menorrhagia with irregular cycle 09/01/2015  . Ovarian cyst 06/16/2015  . Uterine fibroid 06/16/2015  . Elevated lipase 06/15/2015  . Generalized abdominal pain 06/15/2015  . Abnormal echocardiogram 05/06/2015  . History of pulmonary embolism 04/17/2015  . Wrist tendonitis 11/28/2014    Oretha Caprice, MPT 05/10/2019, 12:53 PM  [facility] [place] [place], [place], [address] Phone: [phone]   Fax:  [phone]  Name: Candace Greene MRN: [id number] Date of Birth: June 16, 1970

## 2019-05-13 NOTE — Telephone Encounter (Signed)
[place] (2nd attempt) and provide [place] with the fax number that we need to send the work note to.

## 2019-05-20 ENCOUNTER — Other Ambulatory Visit: Payer: Self-pay

## 2019-05-20 MED ORDER — BUTALBITAL-APAP-CAFFEINE 50-325-40 MG PO TABS: 1.0000 | ORAL_TABLET | Freq: Four times a day (QID) | ORAL | 1 refills | Status: DC | PRN

## 2019-05-20 NOTE — Telephone Encounter (Signed)
Candace Greene is requesting a refill on Fioricet.

## 2019-05-24 ENCOUNTER — Encounter: Payer: BC Managed Care – PPO | Admitting: Physical Therapy

## 2019-05-28 ENCOUNTER — Ambulatory Visit (INDEPENDENT_AMBULATORY_CARE_PROVIDER_SITE_OTHER): Payer: BC Managed Care – PPO | Admitting: Nurse Practitioner

## 2019-05-28 ENCOUNTER — Encounter: Payer: Self-pay | Admitting: Nurse Practitioner

## 2019-05-28 ENCOUNTER — Other Ambulatory Visit: Payer: Self-pay

## 2019-05-28 VITALS — BP 141/84 | HR 62 | Temp 97.6°F | Ht 62.0 in | Wt 197.4 lb

## 2019-05-28 DIAGNOSIS — L03031 Cellulitis of right toe: Secondary | ICD-10-CM | POA: Insufficient documentation

## 2019-05-28 DIAGNOSIS — S90221A Contusion of right lesser toe(s) with damage to nail, initial encounter: Secondary | ICD-10-CM

## 2019-05-28 MED ORDER — DOXYCYCLINE HYCLATE 100 MG PO TABS: 100.0000 mg | ORAL_TABLET | Freq: Two times a day (BID) | ORAL | 0 refills | Status: AC

## 2019-05-28 MED ORDER — FLUCONAZOLE 150 MG PO TABS: 150.0000 mg | ORAL_TABLET | Freq: Every day | ORAL | 1 refills | Status: DC

## 2019-05-28 NOTE — Assessment & Plan Note (Addendum)
This is a pleasant and healthy 49 year old female who I am seeing with Worthy Keeler, NP, she had a pedicure done, subsequently she started to have some drainage from the lateral aspect of her right great toenail at the lateral nail fold. No fevers, chills, drainage is slightly purulent, minimally tender. On exam I am able to express some purulent drainage, this was cultured. I think doxycycline 100 mg twice daily for 7 days with warm compresses should knock this out.

## 2019-05-28 NOTE — Patient Instructions (Signed)
Paronychia Paronychia is an infection of the skin. It happens near a fingernail or toenail. It may cause pain and swelling around the nail. In some cases, a fluid-filled bump (abscess) can form near or under the nail. Usually, this condition is not serious, and it clears up with treatment. Follow these instructions at home: Wound care  Keep the affected area clean.  Soak the fingers or toes in warm water as told by your doctor. You may be told to do this for 20 minutes, 2-3 times a day.  Keep the area dry when you are not soaking it.  Do not try to drain a fluid-filled bump on your own.  Follow instructions from your doctor about how to take care of the affected area. Make sure you: ? Wash your hands with soap and water before you change your bandage (dressing). If you cannot use soap and water, use hand sanitizer. ? Change your bandage as told by your doctor.  If you had a fluid-filled bump and your doctor drained it, check the area every day for signs of infection. Check for: ? Redness, swelling, or pain. ? Fluid or blood. ? Warmth. ? Pus or a bad smell. Medicines   Take over-the-counter and prescription medicines only as told by your doctor.  If you were prescribed an antibiotic medicine, take it as told by your doctor. Do not stop taking it even if you start to feel better. General instructions  Avoid touching any chemicals.  Do not pick at the affected area. Prevention  To prevent this condition from happening again: ? Wear rubber gloves when putting your hands in water for washing dishes or other tasks. ? Wear gloves if your hands might touch cleaners or chemicals. ? Avoid injuring your nails or fingertips. ? Do not bite your nails or tear hangnails. ? Do not cut your nails very short. ? Do not cut the skin at the base and sides of the nail (cuticles). ? Use clean nail clippers or scissors when trimming nails. Contact a doctor if:  You feel worse.  You do not get  better.  You have more fluid, blood, or pus coming from the affected area.  Your finger or knuckle is swollen or is hard to move. Get help right away if you have:  A fever or chills.  Redness spreading from the affected area.  Pain in a joint or muscle. Summary  Paronychia is an infection of the skin. It happens near a fingernail or toenail.  This condition may cause pain and swelling around the nail.  Soak the fingers or toes in warm water as told by your doctor.  Usually, this condition is not serious, and it clears up with treatment. This information is not intended to replace advice given to you by your health care provider. Make sure you discuss any questions you have with your health care provider. Document Revised: 04/28/2017 Document Reviewed: 04/24/2017 Elsevier Patient Education  2020 Elsevier Inc.  

## 2019-05-28 NOTE — Progress Notes (Addendum)
Acute Office Visit  Subjective:    Patient ID: Candace Greene, female    DOB: 08/08/1970, 49 y.o.   MRN: [id number]  Chief Complaint  Patient presents with  . Toe Injury    HPI Patient is in today for painful and bleeding toenail of the medial portion of the right great toe that started Friday after tripping and falling and hitting her toe on the dresser.  Subsequently a large bottle of hand sanitizer also fell on the toe.  She reports the toenail initially was painful with some bruising under the toenail.  On Saturday she had a pedicure and the toenail started bleeding and has continued to bleed intermittently since that time.  She also reports the toe is swollen.  She has been soaking her foot in Epson salts and peroxide.  She reports the pain as throbbing, worse when walking.  She is concerned because the area is still bleeding.  She also requests a letter of the events that occurred leading to the injury.  Past Medical History:  Diagnosis Date  . Heel spur    right  . Pulmonary embolus [facility])     Past Surgical History:  Procedure Laterality Date  . No prior surgery      Family History  Problem Relation Age of Onset  . Stroke Father        <60  . Heart attack Father   . Birth defects Other   . Hyperlipidemia Other   . Breast cancer Maternal Grandmother   . Breast cancer Maternal Aunt   . Cancer Other        GF    Social History   Socioeconomic History  . Marital status: Married    Spouse name: Not on file  . Number of children: 2  . Years of education: Not on file  . Highest education level: Not on file  Occupational History    Comment: [employer]  Tobacco Use  . Smoking status: Never Smoker  . Smokeless tobacco: Never Used  Substance and Sexual Activity  . Alcohol use: Yes    Alcohol/week: 0.0 standard drinks    Comment: 3 glasses wine per night  . Drug use: No  . Sexual activity: Not on file  Other Topics Concern  . Not on file  Social History Narrative  .  Not on file   Social Determinants of Health   Financial Resource Strain:   . Difficulty of Paying Living Expenses: Not on file  Food Insecurity:   . Worried About Charity fundraiser in the Last Year: Not on file  . Ran Out of Food in the Last Year: Not on file  Transportation Needs:   . Lack of Transportation (Medical): Not on file  . Lack of Transportation (Non-Medical): Not on file  Physical Activity:   . Days of Exercise per Week: Not on file  . Minutes of Exercise per Session: Not on file  Stress:   . Feeling of Stress : Not on file  Social Connections:   . Frequency of Communication with Friends and Family: Not on file  . Frequency of Social Gatherings with Friends and Family: Not on file  . Attends Religious Services: Not on file  . Active Member of Clubs or Organizations: Not on file  . Attends Archivist Meetings: Not on file  . Marital Status: Not on file  Intimate Partner Violence:   . Fear of Current or Ex-Partner: Not on file  . Emotionally Abused: Not  on file  . Physically Abused: Not on file  . Sexually Abused: Not on file    Outpatient Medications Prior to Visit  Medication Sig Dispense Refill  . Aspirin-Acetaminophen-Caffeine (EXCEDRIN PO) Take by mouth.    . butalbital-acetaminophen-caffeine (FIORICET) 50-325-40 MG tablet Take 1-2 tablets by mouth every 6 (six) hours as needed for headache. 20 tablet 1  . cyclobenzaprine (FLEXERIL) 10 MG tablet Take 1 tablet (10 mg total) by mouth at bedtime as needed for muscle spasms. 30 tablet 0  . meloxicam (MOBIC) 15 MG tablet One tab PO qAM with a meal for 2 weeks, then daily prn pain. 30 tablet 3  . mupirocin ointment (BACTROBAN) 2 % Apply topically 3 (three) times daily as needed. Apply to inside of each nares daily for 10 days then twice a week for maintenance. 30 g 0  . naproxen (NAPROSYN) 500 MG tablet Take 1 tablet (500 mg total) by mouth 2 (two) times daily as needed for moderate pain (swelling). 14  tablet 0  . nystatin (MYCOSTATIN/NYSTOP) powder Apply topically 3 (three) times daily. Gavis pharmaceutical brand if possible. 60 g 0   No facility-administered medications prior to visit.    No Known Allergies  Review of Systems  Constitutional: Negative for chills and fever.  Musculoskeletal: Negative for gait problem and joint swelling.  Skin: Positive for wound. Negative for pallor.  Hematological: Does not bruise/bleed easily.       Objective:    Physical Exam Constitutional:      Appearance: Normal appearance.  Cardiovascular:     Rate and Rhythm: Normal rate.     Pulses: Normal pulses.  Pulmonary:     Effort: Pulmonary effort is normal.  Musculoskeletal:     Right foot: Normal range of motion.     Left foot: Normal range of motion.       Feet:  Feet:     Right foot:     Skin integrity: Warmth present.     Left foot:     Skin integrity: No warmth.     Toenail Condition: Left toenails are normal.  Skin:    General: Skin is warm and dry.     Capillary Refill: Capillary refill takes less than 2 seconds.  Neurological:     General: No focal deficit present.     Mental Status: She is alert and oriented to person, place, and time.  Psychiatric:        Mood and Affect: Mood normal.        Behavior: Behavior normal.     BP (!) 141/84   Pulse 62   Temp 97.6 F (36.4 C) (Oral)   Ht 5\' 2"  (1.575 m)   Wt 197 lb 6.4 oz (89.5 kg)   SpO2 100%   BMI 36.10 kg/m  Wt Readings from Last 3 Encounters:  05/28/19 197 lb 6.4 oz (89.5 kg)  04/16/19 198 lb (89.8 kg)  04/03/19 197 lb (89.4 kg)    Health Maintenance Due  Topic Date Due  . PAP SMEAR-Modifier  05/03/2018    There are no preventive care reminders to display for this patient.   Lab Results  Component Value Date   TSH 1.54 11/16/2018   Lab Results  Component Value Date   WBC 4.8 11/16/2018   HGB 11.2 (L) 11/16/2018   HCT 32.4 (L) 11/16/2018   MCV 95.9 11/16/2018   PLT 274 11/16/2018   Lab  Results  Component Value Date   NA 139 01/11/2018  K 4.0 01/11/2018   CO2 25 01/11/2018   GLUCOSE 92 01/11/2018   BUN 11 01/11/2018   CREATININE 1.04 01/11/2018   BILITOT 0.5 01/11/2018   ALKPHOS 44 12/29/2015   AST 18 01/11/2018   ALT 10 01/11/2018   PROT 7.1 01/11/2018   ALBUMIN 3.7 12/29/2015   CALCIUM 9.2 01/11/2018   ANIONGAP 14 04/18/2015   Lab Results  Component Value Date   CHOL 161 08/15/2013   Lab Results  Component Value Date   HDL 70 08/15/2013   Lab Results  Component Value Date   LDLCALC 78 08/15/2013   Lab Results  Component Value Date   TRIG 64 08/15/2013   Lab Results  Component Value Date   CHOLHDL 2.3 08/15/2013   No results found for: HGBA1C     Assessment & Plan:  1. Paronychia of great toe, right Symptoms and presentation most correlate with paronychia great toe.  Small amount of serous and purulent fluid expressed manually and sent for culture. Prescription for doxycycline sent to pharmacy.  Additional prescription for Diflucan sent at patient request due to yeast infections common with antibiotic use. Area was cleansed and dried and triple antibiotic ointment applied along with a clean Band-Aid.  Patient was educated on keeping the area clean and dry and using the antibiotic ointment for no more than 4 days. Patient was provided with a note indicating her diagnosis for work. If symptoms worsen or fail to improve patient to contact the office. - WOUND CULTURE - doxycycline (VIBRA-TABS) 100 MG tablet; Take 1 tablet (100 mg total) by mouth 2 (two) times daily for 7 days.  Dispense: 14 tablet; Refill: 0 - fluconazole (DIFLUCAN) 150 MG tablet; Take 1 tablet (150 mg total) by mouth daily.  Dispense: 1 tablet; Refill: 1  2. Subungual contusion of toenail of left foot, initial encounter Small, nonpainful hematoma located under the most proximal edge of the left great toe nail.  No need for drainage seen today. Discussed with the patient if the  hematoma becomes worse or the area becomes painful to inform us and drainage may be necessary. Patient to follow-up if symptoms worsen or fail to improve    Orma Render, NP

## 2019-05-28 NOTE — Progress Notes (Signed)
    Procedures performed today:    None.  Independent interpretation of tests performed by another provider:   None.  Impression and Recommendations:    Paronychia of great toe, right This is a pleasant and healthy 49 year old female who I am seeing with Worthy Keeler, NP, she had a pedicure done, subsequently she started to have some drainage from the lateral aspect of her right great toenail at the lateral nail fold. No fevers, chills, drainage is slightly purulent, minimally tender. On exam I am able to express some purulent drainage, this was cultured. I think doxycycline 100 mg twice daily for 7 days with warm compresses should knock this out.    ___________________________________________ Gwen Her. Dianah Field, M.D., ABFM., CAQSM. Primary Care and [place] Instructor of [place] of [facility]

## 2019-05-29 ENCOUNTER — Ambulatory Visit: Payer: BC Managed Care – PPO | Admitting: Physician Assistant

## 2019-05-31 ENCOUNTER — Ambulatory Visit: Payer: BC Managed Care – PPO | Admitting: Sports Medicine

## 2019-05-31 ENCOUNTER — Ambulatory Visit (INDEPENDENT_AMBULATORY_CARE_PROVIDER_SITE_OTHER): Payer: BC Managed Care – PPO | Admitting: Sports Medicine

## 2019-05-31 ENCOUNTER — Ambulatory Visit
Admission: RE | Admit: 2019-05-31 | Discharge: 2019-05-31 | Disposition: A | Discharge status: Home / Self Care | Payer: BC Managed Care – PPO | Source: Ambulatory Visit | Attending: Family Medicine | Admitting: Family Medicine

## 2019-05-31 ENCOUNTER — Other Ambulatory Visit: Payer: Self-pay

## 2019-05-31 ENCOUNTER — Other Ambulatory Visit: Payer: BC Managed Care – PPO

## 2019-05-31 ENCOUNTER — Encounter: Payer: Self-pay | Admitting: Sports Medicine

## 2019-05-31 DIAGNOSIS — M66231 Spontaneous rupture of extensor tendons, right forearm: Secondary | ICD-10-CM

## 2019-05-31 DIAGNOSIS — M66241 Spontaneous rupture of extensor tendons, right hand: Secondary | ICD-10-CM

## 2019-05-31 DIAGNOSIS — L03031 Cellulitis of right toe: Secondary | ICD-10-CM

## 2019-05-31 DIAGNOSIS — N6320 Unspecified lump in the left breast, unspecified quadrant: Secondary | ICD-10-CM

## 2019-05-31 LAB — WOUND CULTURE
MICRO NUMBER:: 10107117
SPECIMEN QUALITY:: ADEQUATE

## 2019-05-31 NOTE — Assessment & Plan Note (Signed)
This is a very pleasant 49 year old female, initially we suspected a tendon rupture of the extensor digitorum to the third digit. An MRI showed more of a longitudinal split tear with a cystic component. I did an injection, the swelling resolved and after some hand therapy her pain and range of motion is back to normal. She is back to work, no further intervention needed.

## 2019-05-31 NOTE — Assessment & Plan Note (Signed)
Paronychia continues to improve. Cultures did grow out Staphylococcus, sensitive to doxycycline so we will continue this. She can keep follow-up with Worthy Keeler, NP next week.

## 2019-05-31 NOTE — Progress Notes (Signed)
    Procedures performed today:    None.  Independent interpretation of tests performed by another provider:   None.  Impression and Recommendations:    Nontraumatic rupture of extensor tendons of hand and wrist, right This is a very pleasant 49 year old female, initially we suspected a tendon rupture of the extensor digitorum to the third digit. An MRI showed more of a longitudinal split tear with a cystic component. I did an injection, the swelling resolved and after some hand therapy her pain and range of motion is back to normal. She is back to work, no further intervention needed.  Paronychia of great toe, right Paronychia continues to improve. Cultures did grow out Staphylococcus, sensitive to doxycycline so we will continue this. She can keep follow-up with Worthy Keeler, NP next week.    ___________________________________________ Gwen Her. Dianah Field, M.D., ABFM., CAQSM. Primary Care and [place] Instructor of [place] of [facility]

## 2019-06-11 ENCOUNTER — Ambulatory Visit (INDEPENDENT_AMBULATORY_CARE_PROVIDER_SITE_OTHER): Payer: BC Managed Care – PPO | Admitting: Family Medicine

## 2019-06-11 ENCOUNTER — Other Ambulatory Visit: Payer: Self-pay

## 2019-06-11 ENCOUNTER — Encounter: Payer: Self-pay | Admitting: Family Medicine

## 2019-06-11 VITALS — BP 139/58 | HR 53 | Ht 62.0 in | Wt 197.0 lb

## 2019-06-11 DIAGNOSIS — R7309 Other abnormal glucose: Secondary | ICD-10-CM | POA: Diagnosis not present

## 2019-06-11 DIAGNOSIS — N760 Acute vaginitis: Secondary | ICD-10-CM

## 2019-06-11 DIAGNOSIS — S90222A Contusion of left lesser toe(s) with damage to nail, initial encounter: Secondary | ICD-10-CM

## 2019-06-11 DIAGNOSIS — L03031 Cellulitis of right toe: Secondary | ICD-10-CM | POA: Diagnosis not present

## 2019-06-11 LAB — POCT GLYCOSYLATED HEMOGLOBIN (HGB A1C): Hemoglobin A1C: 5.3 % (ref 4.0–5.6)

## 2019-06-11 MED ORDER — FLUCONAZOLE 150 MG PO TABS: 150.0000 mg | ORAL_TABLET | Freq: Once | ORAL | 0 refills | Status: AC

## 2019-06-11 MED ORDER — DOXYCYCLINE HYCLATE 100 MG PO TABS: 100.0000 mg | ORAL_TABLET | Freq: Two times a day (BID) | ORAL | 0 refills | Status: DC

## 2019-06-11 NOTE — Progress Notes (Signed)
Established Patient Office Visit  Subjective:  Patient ID: Candace Greene, female    DOB: [birth year]  Age: 49 y.o. MRN: [id number]  CC:  Chief Complaint  Patient presents with  . Follow-up    toe pain R great toe she reports that when she wears a sock and shoe it will bleed sometimes    HPI Candace Greene presents for right great toe pain.  She said she was seen on February 2 for a paronychia as well as a subungual contusion of the toenail.  She says it has still continued to bleed on and off.  She says the last time it blood was on Saturday but she has not seen any drainage or pus.  She did complete the antibiotics and says that it does feel better.  In fact she has not had any pain or discomfort since then.  Unfortunately, she does feel like she has a yeast infection now.  She was given 1 tab of Diflucan and felt like initially it helped but feels like she is having some itching and irritation now.  She also reports that she just feels like she is had a couple episodes where she was just going to fall.  She said she really cannot pinpoint a certain sensation.  She denies any numbness or tingling in the feet or extremities.  She does not quite feel like she is going to pass out but just feels like she is suddenly going to fall.  It is very brief and has actually caused her to fall.  She denies any recent chest pain or palpitations. No CP or palpitations.    Past Medical History:  Diagnosis Date  . Heel spur    right  . Pulmonary embolus [facility])     Past Surgical History:  Procedure Laterality Date  . No prior surgery      Family History  Problem Relation Age of Onset  . Stroke Father        <60  . Heart attack Father   . Birth defects Other   . Hyperlipidemia Other   . Breast cancer Maternal Grandmother   . Breast cancer Maternal Aunt   . Cancer Other        GF    Social History   Socioeconomic History  . Marital status: Married    Spouse name: Not on file  .  Number of children: 2  . Years of education: Not on file  . Highest education level: Not on file  Occupational History    Comment: [employer]  Tobacco Use  . Smoking status: Never Smoker  . Smokeless tobacco: Never Used  Substance and Sexual Activity  . Alcohol use: Yes    Alcohol/week: 0.0 standard drinks    Comment: 3 glasses wine per night  . Drug use: No  . Sexual activity: Not on file  Other Topics Concern  . Not on file  Social History Narrative  . Not on file   Social Determinants of Health   Financial Resource Strain:   . Difficulty of Paying Living Expenses: Not on file  Food Insecurity:   . Worried About Charity fundraiser in the Last Year: Not on file  . Ran Out of Food in the Last Year: Not on file  Transportation Needs:   . Lack of Transportation (Medical): Not on file  . Lack of Transportation (Non-Medical): Not on file  Physical Activity:   . Days of Exercise per Week: Not on file  .  Minutes of Exercise per Session: Not on file  Stress:   . Feeling of Stress : Not on file  Social Connections:   . Frequency of Communication with Friends and Family: Not on file  . Frequency of Social Gatherings with Friends and Family: Not on file  . Attends Religious Services: Not on file  . Active Member of Clubs or Organizations: Not on file  . Attends Archivist Meetings: Not on file  . Marital Status: Not on file  Intimate Partner Violence:   . Fear of Current or Ex-Partner: Not on file  . Emotionally Abused: Not on file  . Physically Abused: Not on file  . Sexually Abused: Not on file    Outpatient Medications Prior to Visit  Medication Sig Dispense Refill  . Aspirin-Acetaminophen-Caffeine (EXCEDRIN PO) Take by mouth.    . butalbital-acetaminophen-caffeine (FIORICET) 50-325-40 MG tablet Take 1-2 tablets by mouth every 6 (six) hours as needed for headache. 20 tablet 1  . meloxicam (MOBIC) 15 MG tablet One tab PO qAM with a meal for 2 weeks, then daily prn  pain. 30 tablet 3  . cyclobenzaprine (FLEXERIL) 10 MG tablet Take 1 tablet (10 mg total) by mouth at bedtime as needed for muscle spasms. 30 tablet 0  . fluconazole (DIFLUCAN) 150 MG tablet Take 1 tablet (150 mg total) by mouth daily. 1 tablet 1  . mupirocin ointment (BACTROBAN) 2 % Apply topically 3 (three) times daily as needed. Apply to inside of each nares daily for 10 days then twice a week for maintenance. 30 g 0  . naproxen (NAPROSYN) 500 MG tablet Take 1 tablet (500 mg total) by mouth 2 (two) times daily as needed for moderate pain (swelling). 14 tablet 0  . nystatin (MYCOSTATIN/NYSTOP) powder Apply topically 3 (three) times daily. Gavis pharmaceutical brand if possible. 60 g 0   No facility-administered medications prior to visit.    No Known Allergies  ROS Review of Systems    Objective:    Physical Exam  Constitutional: She is oriented to person, place, and time. She appears well-developed and well-nourished.  HENT:  Head: Normocephalic and atraumatic.  Cardiovascular: Normal rate, regular rhythm and normal heart sounds.  Pulmonary/Chest: Effort normal and breath sounds normal.  Neurological: She is alert and oriented to person, place, and time.  Skin: Skin is warm and dry.  Right great nail with blood under the lateral  prox border.  No erythema along the edge of the nail.  No expressible pus or drainage.  Just a little bit of edema at the nail border.  Psychiatric: She has a normal mood and affect. Her behavior is normal.    BP (!) 139/58   Pulse (!) 53   Ht 5\' 2"  (1.575 m)   Wt 197 lb (89.4 kg)   LMP 05/25/2019   SpO2 96%   BMI 36.03 kg/m  Wt Readings from Last 3 Encounters:  06/11/19 197 lb (89.4 kg)  05/28/19 197 lb 6.4 oz (89.5 kg)  04/16/19 198 lb (89.8 kg)     Health Maintenance Due  Topic Date Due  . PAP SMEAR-Modifier  05/03/2018    There are no preventive care reminders to display for this patient.  Lab Results  Component Value Date   TSH  1.54 11/16/2018   Lab Results  Component Value Date   WBC 4.8 11/16/2018   HGB 11.2 (L) 11/16/2018   HCT 32.4 (L) 11/16/2018   MCV 95.9 11/16/2018   PLT 274 11/16/2018  Lab Results  Component Value Date   NA 139 01/11/2018   K 4.0 01/11/2018   CO2 25 01/11/2018   GLUCOSE 92 01/11/2018   BUN 11 01/11/2018   CREATININE 1.04 01/11/2018   BILITOT 0.5 01/11/2018   ALKPHOS 44 12/29/2015   AST 18 01/11/2018   ALT 10 01/11/2018   PROT 7.1 01/11/2018   ALBUMIN 3.7 12/29/2015   CALCIUM 9.2 01/11/2018   ANIONGAP 14 04/18/2015   Lab Results  Component Value Date   CHOL 161 08/15/2013   Lab Results  Component Value Date   HDL 70 08/15/2013   Lab Results  Component Value Date   LDLCALC 78 08/15/2013   Lab Results  Component Value Date   TRIG 64 08/15/2013   Lab Results  Component Value Date   CHOLHDL 2.3 08/15/2013   No results found for: HGBA1C    Assessment & Plan:   Problem List Items Addressed This Visit      Musculoskeletal and Integument   Paronychia of great toe, right - Primary   Relevant Medications   fluconazole (DIFLUCAN) 150 MG tablet    Other Visit Diagnoses    Subungual contusion of toenail of left foot, initial encounter       Abnormal glucose       Relevant Orders   POCT glycosylated hemoglobin (Hb A1C)   Acute vaginitis          Paronychia of the right great toe-it does seem to be much improved.  See any active infection today.  I do see a little crusted blood at the edge.  I think the movement of the nail is causing it to bleed intermittently.  I did go and send over a second round of antibiotics for her to fill only if needed if she feels like it suddenly getting worse.  If that happens go ahead and start the medication and then come in to have the nail removed.  She did bring in some paperwork for work that we will try to get completed as well.  Subungual contusion-at some point she will lose the nail.  We discussed that we could  preemptively take the nail off.  She could certainly consider it but right now she is not having significant pain or discomfort so we will just can leave it in place.  Frequent falls -unclear etiology.  She is not really describing lightheadedness or dizziness per se but we did do orthostatics and they were normal.  I do not know if what she is describing is more of a weakness or a clumsiness.  She denies any numbness or tingling in her feet.  So really I am just not sure.  She is not sure if maybe it happens more when she is feeling tired.  For now we will just have her keep an eye on it and let us know if it is occurring more frequently or if she started to notice any weakness or lightheadedness or dizziness.  Vaginitis-we will go ahead and treat with Diflucan.  Not improving okay to return for wet prep.  Meds ordered this encounter  Medications  . fluconazole (DIFLUCAN) 150 MG tablet    Sig: Take 1 tablet (150 mg total) by mouth once for 1 dose.    Dispense:  2 tablet    Refill:  0  . doxycycline (VIBRA-TABS) 100 MG tablet    Sig: Take 1 tablet (100 mg total) by mouth 2 (two) times daily.    Dispense:  20  tablet    Refill:  0    Follow-up: Return in about 1 week (around 06/18/2019) for recheck toe.    Beatrice Lecher, MD

## 2019-06-21 ENCOUNTER — Other Ambulatory Visit: Payer: Self-pay

## 2019-06-21 ENCOUNTER — Ambulatory Visit (INDEPENDENT_AMBULATORY_CARE_PROVIDER_SITE_OTHER): Payer: BC Managed Care – PPO | Admitting: Family Medicine

## 2019-06-21 ENCOUNTER — Encounter: Payer: Self-pay | Admitting: Family Medicine

## 2019-06-21 VITALS — BP 117/52 | HR 63 | Ht 62.0 in | Wt 197.0 lb

## 2019-06-21 DIAGNOSIS — K5901 Slow transit constipation: Secondary | ICD-10-CM | POA: Insufficient documentation

## 2019-06-21 DIAGNOSIS — R1084 Generalized abdominal pain: Secondary | ICD-10-CM

## 2019-06-21 DIAGNOSIS — S90222A Contusion of left lesser toe(s) with damage to nail, initial encounter: Secondary | ICD-10-CM | POA: Diagnosis not present

## 2019-06-21 NOTE — Patient Instructions (Signed)
Start back on MiraLAX nightly.  1 capful.  After couple weeks if you are doing well okay to decrease to every other day.  Or you can just decrease down to half of a cap nightly.  You could also consider stool softeners as an option.  And there are some prescription options if that something that you would prefer.

## 2019-06-21 NOTE — Progress Notes (Signed)
Established Patient Office Visit  Subjective:  Patient ID: Candace Greene, female    DOB: April 29, 1970  Age: 49 y.o. MRN: [id number]  CC:  Chief Complaint  Patient presents with  . Follow-up    R great toe pain. this has gotten better.   . Abdominal Pain    HPI [name] presents for recheck Right toe.  She had a nail injury that caused a paronychia and a subungual hematoma.  She never picked up the  doxycycline bc she felt like her toe was getting better.  No more active bleeding. Stil tender.   She has been having abdominal pain that got worse today and had to leave work early.  She did have a BM and felt a little better.  She says her belly feels most painful in the right upper quadrant in the left lower quadrant she has not had any fever sweats or chills.  She did pass a small amount of blood but thinks that was really because of straining today.  Prior to today's bowel movement she had not gone in several days.  She has had problems with constipation before and was on MiraLAX daily for quite some time but eventually just quit taking it.  She says the belly pain has actually been coming and going over the last week and even been waking her up at night.  She has been passing a little bit more gas.  Past Medical History:  Diagnosis Date  . Heel spur    right  . Pulmonary embolus [facility])     Past Surgical History:  Procedure Laterality Date  . No prior surgery      Family History  Problem Relation Age of Onset  . Stroke Father        <60  . Heart attack Father   . Birth defects Other   . Hyperlipidemia Other   . Breast cancer Maternal Grandmother   . Breast cancer Maternal Aunt   . Cancer Other        GF    Social History   Socioeconomic History  . Marital status: Married    Spouse name: Not on file  . Number of children: 2  . Years of education: Not on file  . Highest education level: Not on file  Occupational History    Comment: [employer]  Tobacco Use  .  Smoking status: Never Smoker  . Smokeless tobacco: Never Used  Substance and Sexual Activity  . Alcohol use: Yes    Alcohol/week: 0.0 standard drinks    Comment: 3 glasses wine per night  . Drug use: No  . Sexual activity: Not on file  Other Topics Concern  . Not on file  Social History Narrative  . Not on file   Social Determinants of Health   Financial Resource Strain:   . Difficulty of Paying Living Expenses: Not on file  Food Insecurity:   . Worried About Charity fundraiser in the Last Year: Not on file  . Ran Out of Food in the Last Year: Not on file  Transportation Needs:   . Lack of Transportation (Medical): Not on file  . Lack of Transportation (Non-Medical): Not on file  Physical Activity:   . Days of Exercise per Week: Not on file  . Minutes of Exercise per Session: Not on file  Stress:   . Feeling of Stress : Not on file  Social Connections:   . Frequency of Communication with Friends and Family:  Not on file  . Frequency of Social Gatherings with Friends and Family: Not on file  . Attends Religious Services: Not on file  . Active Member of Clubs or Organizations: Not on file  . Attends Archivist Meetings: Not on file  . Marital Status: Not on file  Intimate Partner Violence:   . Fear of Current or Ex-Partner: Not on file  . Emotionally Abused: Not on file  . Physically Abused: Not on file  . Sexually Abused: Not on file    Outpatient Medications Prior to Visit  Medication Sig Dispense Refill  . Aspirin-Acetaminophen-Caffeine (EXCEDRIN PO) Take by mouth.    . butalbital-acetaminophen-caffeine (FIORICET) 50-325-40 MG tablet Take 1-2 tablets by mouth every 6 (six) hours as needed for headache. 20 tablet 1  . meloxicam (MOBIC) 15 MG tablet One tab PO qAM with a meal for 2 weeks, then daily prn pain. 30 tablet 3  . doxycycline (VIBRA-TABS) 100 MG tablet Take 1 tablet (100 mg total) by mouth 2 (two) times daily. 20 tablet 0   No facility-administered  medications prior to visit.    No Known Allergies  ROS Review of Systems    Objective:    Physical Exam  Constitutional: She is oriented to person, place, and time. She appears well-developed and well-nourished.  HENT:  Head: Normocephalic and atraumatic.  Right Ear: External ear normal.  Left Ear: External ear normal.  Eyes: Conjunctivae are normal.  Cardiovascular: Normal rate, regular rhythm and normal heart sounds.  Pulmonary/Chest: Effort normal and breath sounds normal.  Abdominal: Soft. Bowel sounds are normal. She exhibits no mass. There is abdominal tenderness. There is no guarding.  Tender to palpation diffusely.  But more tender in the left lower quadrant.  Neurological: She is alert and oriented to person, place, and time.  Skin: Skin is warm and dry.  Right toenail overall looks good.  There is still some dried blood under the lateral border of the nail but no seepage no erythema and no induration.  Still just slightly tender at the base of the nail.  Psychiatric: She has a normal mood and affect. Her behavior is normal.    BP (!) 117/52   Pulse 63   Ht 5\' 2"  (1.575 m)   Wt 197 lb (89.4 kg)   LMP 05/25/2019   SpO2 100%   BMI 36.03 kg/m  Wt Readings from Last 3 Encounters:  06/21/19 197 lb (89.4 kg)  06/11/19 197 lb (89.4 kg)  05/28/19 197 lb 6.4 oz (89.5 kg)     Health Maintenance Due  Topic Date Due  . PAP SMEAR-Modifier  05/03/2018    There are no preventive care reminders to display for this patient.  Lab Results  Component Value Date   TSH 1.54 11/16/2018   Lab Results  Component Value Date   WBC 4.8 11/16/2018   HGB 11.2 (L) 11/16/2018   HCT 32.4 (L) 11/16/2018   MCV 95.9 11/16/2018   PLT 274 11/16/2018   Lab Results  Component Value Date   NA 139 01/11/2018   K 4.0 01/11/2018   CO2 25 01/11/2018   GLUCOSE 92 01/11/2018   BUN 11 01/11/2018   CREATININE 1.04 01/11/2018   BILITOT 0.5 01/11/2018   ALKPHOS 44 12/29/2015   AST 18  01/11/2018   ALT 10 01/11/2018   PROT 7.1 01/11/2018   ALBUMIN 3.7 12/29/2015   CALCIUM 9.2 01/11/2018   ANIONGAP 14 04/18/2015   Lab Results  Component Value Date  CHOL 161 08/15/2013   Lab Results  Component Value Date   HDL 70 08/15/2013   Lab Results  Component Value Date   LDLCALC 78 08/15/2013   Lab Results  Component Value Date   TRIG 64 08/15/2013   Lab Results  Component Value Date   CHOLHDL 2.3 08/15/2013   Lab Results  Component Value Date   HGBA1C 5.3 06/11/2019      Assessment & Plan:   Problem List Items Addressed This Visit      Digestive   Slow transit constipation - Primary    Start back on MiraLAX nightly.  1 capful.  After couple weeks if you are doing well okay to decrease to every other day.  Or you can just decrease down to half of a cap nightly.  You could also consider stool softeners as an option.  And there are some prescription options if that something that you would prefer.        Other   Generalized abdominal pain    Other Visit Diagnoses    Subungual contusion of toenail of left foot, initial encounter         Nail contusion - seems to be healing well.    Gen abdominal pain-secondary to constipation as above.  Did give her warning signs and symptoms to call back if she is not improving develops worsening pain or develops a fever.  No orders of the defined types were placed in this encounter.   Follow-up: Return if symptoms worsen or fail to improve.    Beatrice Lecher, MD

## 2019-06-21 NOTE — Assessment & Plan Note (Signed)
Start back on MiraLAX nightly.  1 capful.  After couple weeks if you are doing well okay to decrease to every other day.  Or you can just decrease down to half of a cap nightly.  You could also consider stool softeners as an option.  And there are some prescription options if that something that you would prefer.

## 2019-06-26 ENCOUNTER — Telehealth: Payer: Self-pay

## 2019-06-26 NOTE — Telephone Encounter (Signed)
Zoiey called and left a message stating the work note needs to have a time frame. She stated 6 months.

## 2019-06-26 NOTE — Telephone Encounter (Signed)
Called lvm advising  pt that letter has been written. Letter printed and placed up front and also advised that she can print this from my chart.Candace Greene, [place]

## 2019-07-04 ENCOUNTER — Other Ambulatory Visit: Payer: Self-pay | Admitting: Family Medicine

## 2019-07-04 DIAGNOSIS — M66231 Spontaneous rupture of extensor tendons, right forearm: Secondary | ICD-10-CM

## 2019-07-04 DIAGNOSIS — M66241 Spontaneous rupture of extensor tendons, right hand: Secondary | ICD-10-CM

## 2019-07-04 MED ORDER — MELOXICAM 15 MG PO TABS: ORAL_TABLET | ORAL | 3 refills | Status: DC

## 2019-07-04 NOTE — Telephone Encounter (Signed)
Patient needs a refill on meloxicam (MOBIC) 15 MG tablet [id number].

## 2019-07-04 NOTE — Telephone Encounter (Signed)
Refill sent.

## 2019-07-25 ENCOUNTER — Ambulatory Visit: Payer: BC Managed Care – PPO | Admitting: Family Medicine

## 2019-08-07 ENCOUNTER — Telehealth: Payer: Self-pay

## 2019-08-07 NOTE — Telephone Encounter (Signed)
Candace Greene called the after hours line about her toe being swollen, red and itchy. I called and left a message for patient to call us back.

## 2019-08-23 ENCOUNTER — Ambulatory Visit: Payer: BC Managed Care – PPO | Admitting: Family Medicine

## 2019-10-07 ENCOUNTER — Encounter: Payer: Self-pay | Admitting: Family Medicine

## 2019-10-07 ENCOUNTER — Ambulatory Visit (INDEPENDENT_AMBULATORY_CARE_PROVIDER_SITE_OTHER): Payer: BC Managed Care – PPO | Admitting: Family Medicine

## 2019-10-07 ENCOUNTER — Other Ambulatory Visit: Payer: Self-pay

## 2019-10-07 DIAGNOSIS — G5603 Carpal tunnel syndrome, bilateral upper limbs: Secondary | ICD-10-CM | POA: Diagnosis not present

## 2019-10-07 DIAGNOSIS — R519 Headache, unspecified: Secondary | ICD-10-CM

## 2019-10-07 MED ORDER — BUTALBITAL-APAP-CAFFEINE 50-325-40 MG PO TABS: 1.0000 | ORAL_TABLET | Freq: Four times a day (QID) | ORAL | 1 refills | Status: DC | PRN

## 2019-10-07 NOTE — Patient Instructions (Signed)
Carpal Tunnel Syndrome  Carpal tunnel syndrome is a condition that causes pain in your hand and arm. The carpal tunnel is a narrow area that is on the palm side of your wrist. Repeated wrist motion or certain diseases may cause swelling in the tunnel. This swelling can pinch the main nerve in the wrist (median nerve). What are the causes? This condition may be caused by:  Repeated wrist motions.  Wrist injuries.  Arthritis.  A sac of fluid (cyst) or abnormal growth (tumor) in the carpal tunnel.  Fluid buildup during pregnancy. Sometimes the cause is not known. What increases the risk? The following factors may make you more likely to develop this condition:  Having a job in which you move your wrist in the same way many times. This includes jobs like being a butcher or a cashier.  Being a woman.  Having other health conditions, such as: ? Diabetes. ? Obesity. ? A thyroid gland that is not active enough (hypothyroidism). ? Kidney failure. What are the signs or symptoms? Symptoms of this condition include:  A tingling feeling in your fingers.  Tingling or a loss of feeling (numbness) in your hand.  Pain in your entire arm. This pain may get worse when you bend your wrist and elbow for a long time.  Pain in your wrist that goes up your arm to your shoulder.  Pain that goes down into your palm or fingers.  A weak feeling in your hands. You may find it hard to grab and hold items. You may feel worse at night. How is this diagnosed? This condition is diagnosed with a medical history and physical exam. You may also have tests, such as:  Electromyogram (EMG). This test checks the signals that the nerves send to the muscles.  Nerve conduction study. This test checks how well signals pass through your nerves.  Imaging tests, such as X-rays, ultrasound, and MRI. These tests check for what might be the cause of your condition. How is this treated? This condition may be treated  with:  Lifestyle changes. You will be asked to stop or change the activity that caused your problem.  Doing exercise and activities that make bones and muscles stronger (physical therapy).  Learning how to use your hand again (occupational therapy).  Medicines for pain and swelling (inflammation). You may have injections in your wrist.  A wrist splint.  Surgery. Follow these instructions at home: If you have a splint:  Wear the splint as told by your doctor. Remove it only as told by your doctor.  Loosen the splint if your fingers: ? Tingle. ? Lose feeling (become numb). ? Turn cold and blue.  Keep the splint clean.  If the splint is not waterproof: ? Do not let it get wet. ? Cover it with a watertight covering when you take a bath or a shower. Managing pain, stiffness, and swelling   If told, put ice on the painful area: ? If you have a removable splint, remove it as told by your doctor. ? Put ice in a plastic bag. ? Place a towel between your skin and the bag. ? Leave the ice on for 20 minutes, 2-3 times per day. General instructions  Take over-the-counter and prescription medicines only as told by your doctor.  Rest your wrist from any activity that may cause pain. If needed, talk with your boss at work about changes that can help your wrist heal.  Do any exercises as told by your doctor,   physical therapist, or occupational therapist.  Keep all follow-up visits as told by your doctor. This is important. Contact a doctor if:  You have new symptoms.  Medicine does not help your pain.  Your symptoms get worse. Get help right away if:  You have very bad numbness or tingling in your wrist or hand. Summary  Carpal tunnel syndrome is a condition that causes pain in your hand and arm.  It is often caused by repeated wrist motions.  Lifestyle changes and medicines are used to treat this problem. Surgery may help in very bad cases.  Follow your doctor's  instructions about wearing a splint, resting your wrist, keeping follow-up visits, and calling for help. This information is not intended to replace advice given to you by your health care provider. Make sure you discuss any questions you have with your health care provider. Document Revised: 08/18/2017 Document Reviewed: 08/18/2017 Elsevier Patient Education  2020 Else[employer] Inc[employer]  

## 2019-10-07 NOTE — Assessment & Plan Note (Signed)
Episode resolved at this point.  Fioricet renewed.

## 2019-10-07 NOTE — Progress Notes (Signed)
Candace Greene - 49 y.o. female MRN [id number]  Date of birth: March 28, 1971  Subjective No chief complaint on file.   HPI Candace Greene is a 49 y.o. female with history of frequent headaches originally scheduled due to severe headache but reports that this has resolved at this point.  She would now like to discuss numbness in her hands.  Reports that she has experienced intermittent episodes of numbness with tingling in bilateral hands, L>R.  She has had some weakness at times causing her to drop things.  This is worse at night but does have symptoms during the day while working.  She works at [employer] and uses her hands quite a bit.  Her symptoms improve with stretches.    ROS:  A comprehensive ROS was completed and negative except as noted per HPI  No Known Allergies  Past Medical History:  Diagnosis Date  . Heel spur    right  . Pulmonary embolus [facility])     Past Surgical History:  Procedure Laterality Date  . No prior surgery      Social History   Socioeconomic History  . Marital status: Married    Spouse name: Not on file  . Number of children: 2  . Years of education: Not on file  . Highest education level: Not on file  Occupational History    Comment: [employer]  Tobacco Use  . Smoking status: Never Smoker  . Smokeless tobacco: Never Used  Vaping Use  . Vaping Use: Never used  Substance and Sexual Activity  . Alcohol use: Yes    Alcohol/week: 0.0 standard drinks    Comment: 3 glasses wine per night  . Drug use: No  . Sexual activity: Not on file  Other Topics Concern  . Not on file  Social History Narrative  . Not on file   Social Determinants of Health   Financial Resource Strain:   . Difficulty of Paying Living Expenses:   Food Insecurity:   . Worried About Charity fundraiser in the Last Year:   . Arboriculturist in the Last Year:   Transportation Needs:   . Film/video editor (Medical):   Marland Kitchen Lack of Transportation (Non-Medical):    Physical Activity:   . Days of Exercise per Week:   . Minutes of Exercise per Session:   Stress:   . Feeling of Stress :   Social Connections:   . Frequency of Communication with Friends and Family:   . Frequency of Social Gatherings with Friends and Family:   . Attends Religious Services:   . Active Member of Clubs or Organizations:   . Attends Archivist Meetings:   Marland Kitchen Marital Status:     Family History  Problem Relation Age of Onset  . Stroke Father        <60  . Heart attack Father   . Birth defects Other   . Hyperlipidemia Other   . Breast cancer Maternal Grandmother   . Breast cancer Maternal Aunt   . Cancer Other        GF    Health Maintenance  Topic Date Due  . Hepatitis C Screening  Never done  . COVID-19 Vaccine (1) Never done  . PAP SMEAR-Modifier  05/03/2018  . INFLUENZA VACCINE  11/24/2019  . TETANUS/TDAP  05/04/2023  . HIV Screening  Completed     ----------------------------------------------------------------------------------------------------------------------------------------------------------------------------------------------------------------- Physical Exam BP (!) 145/75 (BP Location: Left Arm, Patient Position: Sitting, Cuff Size:  Normal)   Pulse (!) 53   Ht 5' 1.81" (1.57 m)   Wt 197 lb (89.4 kg)   SpO2 99%   BMI 36.25 kg/m   Physical Exam Constitutional:      Appearance: Normal appearance.  HENT:     Head: Normocephalic and atraumatic.  Eyes:     General: No scleral icterus. Cardiovascular:     Rate and Rhythm: Normal rate and regular rhythm.  Pulmonary:     Effort: Pulmonary effort is normal.     Breath sounds: Normal breath sounds.  Neurological:     Mental Status: She is alert.     Comments: + tinel at carpal tunnel bilaterally.  + phalen test with numbness in median nerve distribution bilaterally.  Strength is fairly good bilaterally.   Psychiatric:        Behavior: Behavior normal.      ------------------------------------------------------------------------------------------------------------------------------------------------------------------------------------------------------------------- Assessment and Plan  Carpal tunnel syndrome Symptoms and distribution of numbness consistent with CTS.  Discussed home stretches and trying braces at bedtime.  If continuing to worsen we can see if Dr. Darene Lamer can do hydro-dissection around median nerve to provide relief prior to pursuing surgical options.   Headache disorder Episode resolved at this point.  Fioricet renewed.     Meds ordered this encounter  Medications  . butalbital-acetaminophen-caffeine (FIORICET) 50-325-40 MG tablet    Sig: Take 1-2 tablets by mouth every 6 (six) hours as needed for headache.    Dispense:  20 tablet    Refill:  1    No follow-ups on file.    This visit occurred during the SARS-CoV-2 public health emergency.  Safety protocols were in place, including screening questions prior to the visit, additional usage of staff PPE, and extensive cleaning of exam room while observing appropriate contact time as indicated for disinfecting solutions.

## 2019-10-07 NOTE — Assessment & Plan Note (Addendum)
Symptoms and distribution of numbness consistent with CTS.  Discussed home stretches and trying braces at bedtime.  If continuing to worsen we can see if Dr. Darene Lamer can do hydro-dissection around median nerve to provide relief prior to pursuing surgical options.

## 2019-12-02 ENCOUNTER — Other Ambulatory Visit: Payer: Self-pay

## 2019-12-02 ENCOUNTER — Ambulatory Visit (INDEPENDENT_AMBULATORY_CARE_PROVIDER_SITE_OTHER): Payer: BC Managed Care – PPO

## 2019-12-02 ENCOUNTER — Encounter: Payer: Self-pay | Admitting: Family Medicine

## 2019-12-02 ENCOUNTER — Ambulatory Visit (INDEPENDENT_AMBULATORY_CARE_PROVIDER_SITE_OTHER): Payer: BC Managed Care – PPO | Admitting: Family Medicine

## 2019-12-02 VITALS — BP 116/69 | HR 62 | Ht 62.0 in | Wt 200.0 lb

## 2019-12-02 DIAGNOSIS — M545 Low back pain, unspecified: Secondary | ICD-10-CM

## 2019-12-02 NOTE — Progress Notes (Signed)
Acute Office Visit  Subjective:    Patient ID: Candace Greene, female    DOB: [birth year], 49 y.o.   MRN: [id number]  Chief Complaint  Patient presents with  . Fall    HPI Patient is in today for tailbone injury and left low back. She was roller skating and feel straight down and hit her bottom. No head injury but says her head hurt immediately afterwards.   Is actually had some headaches on and off since then.  She says she continued to have pain over her low back along the buttock crease area and into that left buttock area.  Its been a couple weeks now and is still quite sore and tender. He tried to do some Epson salt soaks, alcohol, and heat.  It did help some.  Past Medical History:  Diagnosis Date  . Heel spur    right  . Pulmonary embolus [facility])     Past Surgical History:  Procedure Laterality Date  . No prior surgery      Family History  Problem Relation Age of Onset  . Stroke Father        <60  . Heart attack Father   . Birth defects Other   . Hyperlipidemia Other   . Breast cancer Maternal Grandmother   . Breast cancer Maternal Aunt   . Cancer Other        GF    Social History   Socioeconomic History  . Marital status: Married    Spouse name: Not on file  . Number of children: 2  . Years of education: Not on file  . Highest education level: Not on file  Occupational History    Comment: [employer]  Tobacco Use  . Smoking status: Never Smoker  . Smokeless tobacco: Never Used  Vaping Use  . Vaping Use: Never used  Substance and Sexual Activity  . Alcohol use: Yes    Alcohol/week: 0.0 standard drinks    Comment: 3 glasses wine per night  . Drug use: No  . Sexual activity: Not on file  Other Topics Concern  . Not on file  Social History Narrative  . Not on file   Social Determinants of Health   Financial Resource Strain:   . Difficulty of Paying Living Expenses:   Food Insecurity:   . Worried About Charity fundraiser in the Last Year:   . Arts development officer in the Last Year:   Transportation Needs:   . Film/video editor (Medical):   Marland Kitchen Lack of Transportation (Non-Medical):   Physical Activity:   . Days of Exercise per Week:   . Minutes of Exercise per Session:   Stress:   . Feeling of Stress :   Social Connections:   . Frequency of Communication with Friends and Family:   . Frequency of Social Gatherings with Friends and Family:   . Attends Religious Services:   . Active Member of Clubs or Organizations:   . Attends Archivist Meetings:   Marland Kitchen Marital Status:   Intimate Partner Violence:   . Fear of Current or Ex-Partner:   . Emotionally Abused:   Marland Kitchen Physically Abused:   . Sexually Abused:     Outpatient Medications Prior to Visit  Medication Sig Dispense Refill  . butalbital-acetaminophen-caffeine (FIORICET) 50-325-40 MG tablet Take 1-2 tablets by mouth every 6 (six) hours as needed for headache. 20 tablet 1  . meloxicam (MOBIC) 15 MG tablet One tab PO qAM with  a meal for 2 weeks, then daily prn pain. 30 tablet 3   No facility-administered medications prior to visit.    No Known Allergies  Review of Systems     Objective:    Physical Exam Vitals reviewed.  Constitutional:      Appearance: She is well-developed.  HENT:     Head: Normocephalic and atraumatic.  Eyes:     Conjunctiva/sclera: Conjunctivae normal.  Cardiovascular:     Rate and Rhythm: Normal rate.  Pulmonary:     Effort: Pulmonary effort is normal.  Musculoskeletal:     Comments: Nontender over the lumbar spine.  But she is tender just left of the sacrum near the SI joint.  She is a little bit tender going into the buttock area nontender over the left greater trochanteric bursa.  Hip range of motion normal.  Strength is 5 out of 5 at the hip knee and ankle.  Skin:    General: Skin is dry.     Coloration: Skin is not pale.  Neurological:     Mental Status: She is alert and oriented to person, place, and time.  Psychiatric:         Behavior: Behavior normal.     BP 116/69   Pulse 62   Ht 5\' 2"  (1.575 m)   Wt 200 lb (90.7 kg)   LMP 11/11/2019 (Approximate)   SpO2 100%   BMI 36.58 kg/m  Wt Readings from Last 3 Encounters:  12/02/19 200 lb (90.7 kg)  10/07/19 197 lb (89.4 kg)  06/21/19 197 lb (89.4 kg)    Health Maintenance Due  Topic Date Due  . Hepatitis C Screening  Never done  . PAP SMEAR-Modifier  05/03/2018  . INFLUENZA VACCINE  11/24/2019    There are no preventive care reminders to display for this patient.   Lab Results  Component Value Date   TSH 1.54 11/16/2018   Lab Results  Component Value Date   WBC 4.8 11/16/2018   HGB 11.2 (L) 11/16/2018   HCT 32.4 (L) 11/16/2018   MCV 95.9 11/16/2018   PLT 274 11/16/2018   Lab Results  Component Value Date   NA 139 01/11/2018   K 4.0 01/11/2018   CO2 25 01/11/2018   GLUCOSE 92 01/11/2018   BUN 11 01/11/2018   CREATININE 1.04 01/11/2018   BILITOT 0.5 01/11/2018   ALKPHOS 44 12/29/2015   AST 18 01/11/2018   ALT 10 01/11/2018   PROT 7.1 01/11/2018   ALBUMIN 3.7 12/29/2015   CALCIUM 9.2 01/11/2018   ANIONGAP 14 04/18/2015   Lab Results  Component Value Date   CHOL 161 08/15/2013   Lab Results  Component Value Date   HDL 70 08/15/2013   Lab Results  Component Value Date   LDLCALC 78 08/15/2013   Lab Results  Component Value Date   TRIG 64 08/15/2013   Lab Results  Component Value Date   CHOLHDL 2.3 08/15/2013   Lab Results  Component Value Date   HGBA1C 5.3 06/11/2019       Assessment & Plan:   Problem List Items Addressed This Visit    None    Visit Diagnoses    Acute midline low back pain without sciatica    -  Primary   Relevant Orders   DG Sacrum/Coccyx   DG Lumbar Spine Complete   Fall from roller shoes, initial encounter       Relevant Orders   DG Sacrum/Coccyx   DG Lumbar Spine Complete  Acute low midline back pain near the sacrum and coccyx worse on the left side.  Go ahead and get x-rays  today since has been a couple weeks even she still having significant pain with change in position such as getting up from a chair.  Continue to apply heat as needed.  Did discuss a trial of prednisone as long as the x-rays are normal and do not show any sign of fracture.  If not improving please let us know and we will refer to sports medicine.  No orders of the defined types were placed in this encounter.    Beatrice Lecher, MD

## 2019-12-02 NOTE — Progress Notes (Signed)
She reports that 2 wks ago she was skating and fell down on her bottom. She stated that it hurts more on the  L side. She also c/o headaches that have been off/on since this happened.   She has had trouble going from a sitting to standing position.   Denies any LOC. She has been using epsom salt,rubbing alcohol, heat and doing some stretches.

## 2019-12-25 ENCOUNTER — Ambulatory Visit (INDEPENDENT_AMBULATORY_CARE_PROVIDER_SITE_OTHER): Payer: BC Managed Care – PPO | Admitting: Physician Assistant

## 2019-12-25 ENCOUNTER — Other Ambulatory Visit: Payer: Self-pay

## 2019-12-25 ENCOUNTER — Encounter: Payer: Self-pay | Admitting: Physician Assistant

## 2019-12-25 VITALS — BP 118/62 | HR 56 | Ht 62.0 in | Wt 200.0 lb

## 2019-12-25 DIAGNOSIS — L304 Erythema intertrigo: Secondary | ICD-10-CM | POA: Diagnosis not present

## 2019-12-25 DIAGNOSIS — S161XXA Strain of muscle, fascia and tendon at neck level, initial encounter: Secondary | ICD-10-CM | POA: Diagnosis not present

## 2019-12-25 DIAGNOSIS — Z23 Encounter for immunization: Secondary | ICD-10-CM

## 2019-12-25 MED ORDER — NYSTATIN 100000 UNIT/GM EX POWD: 1.0000 "application " | Freq: Three times a day (TID) | CUTANEOUS | 0 refills | Status: DC

## 2019-12-25 MED ORDER — CYCLOBENZAPRINE HCL 10 MG PO TABS: 10.0000 mg | ORAL_TABLET | Freq: Three times a day (TID) | ORAL | 0 refills | Status: DC | PRN

## 2019-12-25 MED ORDER — KETOROLAC TROMETHAMINE 60 MG/2ML IM SOLN
60.0000 mg | Freq: Once | INTRAMUSCULAR | Status: AC
  Administered 2019-12-25: 60 mg via INTRAMUSCULAR

## 2019-12-25 MED ORDER — CLOTRIMAZOLE-BETAMETHASONE 1-0.05 % EX CREA: 1.0000 "application " | TOPICAL_CREAM | Freq: Two times a day (BID) | CUTANEOUS | 0 refills | Status: DC

## 2019-12-25 NOTE — Addendum Note (Signed)
Addended byAnnamaria Helling on: 12/25/2019 01:38 PM   Modules accepted: Orders

## 2019-12-25 NOTE — Progress Notes (Signed)
Subjective:    Patient ID: Candace Greene, female    DOB: 1971/03/23, 49 y.o.   MRN: [id number]  HPI  Patient is a 49 year old female who presents to the clinic to discuss bilateral itchy and irritated breast rash and neck pain.  Patient has a history of skin rash underneath both breasts.  It just seems to be worse than usual.  She usually uses nystatin powder and helps significantly.  This time she is getting more redness and irritation despite using nystatin powder.  It is very hot and she is sweating more.  She wonders what else she can do.  Patient also woke up this morning with left-sided neck pain and pressure.  Seems to extend from her upper neck into her upper back.  She denies any known trauma or injury.  She denies any numbness or tingling radiating down arm.  She does have decreased range of motion of neck due to pain.  She has not tried anything to make better today.   .. Active Ambulatory Problems    Diagnosis Date Noted  . Wrist tendonitis 11/28/2014  . History of pulmonary embolism 04/17/2015  . Abnormal echocardiogram 05/06/2015  . Elevated lipase 06/15/2015  . Generalized abdominal pain 06/15/2015  . Ovarian cyst 06/16/2015  . Uterine fibroid 06/16/2015  . Pica 09/01/2015  . DUB (dysfunctional uterine bleeding) 09/01/2015  . Iron deficiency anemia 09/01/2015  . Menorrhagia with irregular cycle 09/01/2015  . Fatty liver 11/04/2015  . Right knee pain 06/27/2016  . Carpal tunnel syndrome 05/11/2017  . Cubital tunnel syndrome 05/11/2017  . Ganglion cyst of dorsum of right wrist 04/01/2019  . Intertrigo 04/01/2019  . 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 Paronychia of great toe, right 05/28/2019  . Slow transit constipation 06/21/2019  . Headache disorder 10/07/2019   Resolved Ambulatory Problems    Diagnosis Date Noted  . KNEE PAIN 03/16/2010  . TOE PAIN 03/16/2010  . Irregular bleeding 11/28/2014  . Acute pulmonary  embolism ([place]) 04/17/2015  . Chest pain 05/06/2015  . Atypical chest pain 06/10/2015  . Epigastric abdominal tenderness on direct palpation 06/10/2015  . Abdominal guarding 06/15/2015  . Fibroid, uterine 09/01/2015  . Right foot pain 08/09/2016  . Positive Murphy's Sign 05/08/2017  . Pneumonia of left lower lobe due to infectious organism 09/12/2017   Past Medical History:  Diagnosis Date  . Heel spur   . Pulmonary embolus ([facility])      Review of Systems See hpi.     Objective:   Physical Exam Vitals reviewed.  Constitutional:      Appearance: Normal appearance.  Cardiovascular:     Rate and Rhythm: Normal rate.     Pulses: Normal pulses.  Pulmonary:     Effort: Pulmonary effort is normal.  Musculoskeletal:     Comments: Decreased decreased range of motion of the neck more to the left than the right. No pain to palpation over cervical spine. Upper back and paraspinal muscles are extremely tight on the left side.  Skin:    Comments: Erythematous macular rash underneath bilateral breasts.  Neurological:     General: No focal deficit present.     Mental Status: She is alert.  Psychiatric:        Mood and Affect: Mood normal.           Assessment & Plan:  .Marland KitchenJosette was seen today for breast pain and neck pain.  Diagnoses and all orders for this  visit:  Intertrigo -     clotrimazole-betamethasone (LOTRISONE) cream; Apply 1 application topically 2 (two) times daily. -     nystatin (MYCOSTATIN/NYSTOP) powder; Apply 1 application topically 3 (three) times daily.  Strain of neck muscle, initial encounter -     cyclobenzaprine (FLEXERIL) 10 MG tablet; Take 1 tablet (10 mg total) by mouth 3 (three) times daily as needed for muscle spasms.   Stronger topical cream was given for intertrigo.  Discussed importance of keeping area dry.  Handout given.  Toradol 60 mg IM given today.  Suggested chiropractic appointment if she has one at home hand.  Encourage massage, IcyHot,  TENS unit, continue Mobic after today.  Start with exercises given on print out for range of motion.  Always sleep with a very supportive pillow.  Follow-up as needed or if pain radiating into arm.

## 2019-12-25 NOTE — Patient Instructions (Addendum)
Consider chiropractor if not improving. Massage tens unit may help too. Use muscle relaxer. Ok to continue Candace Greene after today.   Cervical Strain and Sprain Rehab Ask your health care provider which exercises are safe for you. Do exercises exactly as told by your health care provider and adjust them as directed. It is normal to feel mild stretching, pulling, tightness, or discomfort as you do these exercises. Stop right away if you feel sudden pain or your pain gets worse. Do not begin these exercises until told by your health care provider. Stretching and range-of-motion exercises Cervical side bending  1. Using good posture, sit on a stable chair or stand up. 2. Without moving your shoulders, slowly tilt your left / right ear to your shoulder until you feel a stretch in the opposite side neck muscles. You should be looking straight ahead. 3. Hold for __________ seconds. 4. Repeat with the other side of your neck. Repeat __________ times. Complete this exercise __________ times a day. Cervical rotation  1. Using good posture, sit on a stable chair or stand up. 2. Slowly turn your head to the side as if you are looking over your left / right shoulder. ? Keep your eyes level with the ground. ? Stop when you feel a stretch along the side and the back of your neck. 3. Hold for __________ seconds. 4. Repeat this by turning to your other side. Repeat __________ times. Complete this exercise __________ times a day. Thoracic extension and pectoral stretch 1. Roll a towel or a small blanket so it is about 4 inches (10 cm) in diameter. 2. Lie down on your back on a firm surface. 3. Put the towel lengthwise, under your spine in the middle of your back. It should not be under your shoulder blades. The towel should line up with your spine from your middle back to your lower back. 4. Put your hands behind your head and let your elbows fall out to your sides. 5. Hold for __________ seconds. Repeat  __________ times. Complete this exercise __________ times a day. Strengthening exercises Isometric upper cervical flexion 1. Lie on your back with a thin pillow behind your head and a small rolled-up towel under your neck. 2. Gently tuck your chin toward your chest and nod your head down to look toward your feet. Do not lift your head off the pillow. 3. Hold for __________ seconds. 4. Release the tension slowly. Relax your neck muscles completely before you repeat this exercise. Repeat __________ times. Complete this exercise __________ times a day. Isometric cervical extension  1. Stand about 6 inches (15 cm) away from a wall, with your back facing the wall. 2. Place a soft object, about 6-8 inches (15-20 cm) in diameter, between the back of your head and the wall. A soft object could be a small pillow, a ball, or a folded towel. 3. Gently tilt your head back and press into the soft object. Keep your jaw and forehead relaxed. 4. Hold for __________ seconds. 5. Release the tension slowly. Relax your neck muscles completely before you repeat this exercise. Repeat __________ times. Complete this exercise __________ times a day. Posture and body mechanics Body mechanics refers to the movements and positions of your body while you do your daily activities. Posture is part of body mechanics. Good posture and healthy body mechanics can help to relieve stress in your body's tissues and joints. Good posture means that your spine is in its natural S-curve position (your spine is  neutral), your shoulders are pulled back slightly, and your head is not tipped forward. The following are general guidelines for applying improved posture and body mechanics to your everyday activities. Sitting  1. When sitting, keep your spine neutral and keep your feet flat on the floor. Use a footrest, if necessary, and keep your thighs parallel to the floor. Avoid rounding your shoulders, and avoid tilting your head  forward. 2. When working at a desk or a computer, keep your desk at a height where your hands are slightly lower than your elbows. Slide your chair under your desk so you are close enough to maintain good posture. 3. When working at a computer, place your monitor at a height where you are looking straight ahead and you do not have to tilt your head forward or downward to look at the screen. Standing   When standing, keep your spine neutral and keep your feet about hip-width apart. Keep a slight bend in your knees. Your ears, shoulders, and hips should line up.  When you do a task in which you stand in one place for a long time, place one foot up on a stable object that is 2-4 inches (5-10 cm) high, such as a footstool. This helps keep your spine neutral. Resting When lying down and resting, avoid positions that are most painful for you. Try to support your neck in a neutral position. You can use a contour pillow or a small rolled-up towel. Your pillow should support your neck but not push on it. This information is not intended to replace advice given to you by your health care provider. Make sure you discuss any questions you have with your health care provider. Document Revised: 08/01/2018 Document Reviewed: 01/10/2018 Elsevier Patient Education  2020 [place] is skin irritation (inflammation) that happens in warm, moist areas of the body. The irritation can cause a rash and make skin raw and itchy. The rash is usually pink or red. It happens mostly between folds of skin or where skin rubs together, such as:  Between the toes.  In the armpits.  In the groin area.  Under the belly.  Under the breasts.  Around the butt area. This condition is not passed from person to person (is not contagious). What are the causes?  Heat, moisture, rubbing, and not enough air movement.  The condition can be made worse by: ? Sweat. ? Bacteria. ? A fungus, such as  yeast. What increases the risk?  Moisture in your skin folds.  You are more likely to develop this condition if you: ? Have diabetes. ? Are overweight. ? Are not able to move around. ? Live in a warm and moist climate. ? Wear splints, braces, or other medical devices. ? Are not able to control your pee (urine) or poop (stool). What are the signs or symptoms?  A pink or red skin rash in the skin fold or near the skin fold.  Raw or scaly skin.  Itching.  A burning feeling.  Bleeding.  Leaking fluid.  A bad smell. How is this treated?  Cleaning and drying your skin.  Taking an antibiotic medicine or using an antibiotic skin cream for a bacterial infection.  Using an antifungal cream on your skin or taking pills for an infection that was caused by a fungus, such as yeast.  Using a steroid ointment to stop the itching and irritation.  Separating the skin fold with a clean cotton cloth to absorb moisture and  allow air to flow into the area. Follow these instructions at home:  Keep the affected area clean and dry.  Do not scratch your skin.  Stay cool as much as you can. Use an air conditioner or a fan, if you have one.  Apply over-the-counter and prescription medicines only as told by your doctor.  If you were prescribed an antibiotic medicine, use it as told by your doctor. Do not stop using the antibiotic even if your condition starts to get better.  Keep all follow-up visits as told by your doctor. This is important. How is this prevented?   Stay at a healthy weight.  Take care of your feet. This is very important if you have diabetes. You should: ? Wear shoes that fit well. ? Keep your feet dry. ? Wear clean cotton or wool socks.  Protect the skin in your groin and butt area as told by your doctor. To do this: ? Follow a regular cleaning routine. ? Use creams, powders, or ointments that protect your skin. ? Change protection pads often.  Do not wear  tight clothes. Wear clothes that: ? Are loose. ? Take moisture away from your body. ? Are made of cotton.  Wear a bra that gives good support, if needed.  Shower and dry yourself well after being active. Use a hair dryer on a cool setting to dry between skin folds.  Keep your blood sugar under control if you have diabetes. Contact a doctor if:  Your symptoms do not get better with treatment.  Your symptoms get worse or they spread.  You notice more redness and warmth.  You have a fever. Summary  Intertrigo is skin irritation that occurs when folds of skin rub together.  This condition is caused by heat, moisture, and rubbing.  This condition may be treated by cleaning and drying your skin and with medicines.  Apply over-the-counter and prescription medicines only as told by your doctor.  Keep all follow-up visits as told by your doctor. This is important. This information is not intended to replace advice given to you by your health care provider. Make sure you discuss any questions you have with your health care provider. Document Revised: 01/18/2018 Document Reviewed: 01/18/2018 Elsevier Patient Education  2020 Candace Greene.

## 2019-12-31 ENCOUNTER — Telehealth: Payer: Self-pay | Admitting: Family Medicine

## 2019-12-31 NOTE — Telephone Encounter (Signed)
Received fax for PA on Clotrimazole-Betamethasone Cream sent through cover my meds and received message it is too soon for a refill placed in providers for review. - CF

## 2020-01-20 ENCOUNTER — Ambulatory Visit (INDEPENDENT_AMBULATORY_CARE_PROVIDER_SITE_OTHER): Payer: BC Managed Care – PPO | Admitting: Family Medicine

## 2020-01-20 ENCOUNTER — Other Ambulatory Visit: Payer: Self-pay

## 2020-01-20 ENCOUNTER — Encounter: Payer: Self-pay | Admitting: Family Medicine

## 2020-01-20 VITALS — Ht 61.81 in | Wt 204.2 lb

## 2020-01-20 DIAGNOSIS — R519 Headache, unspecified: Secondary | ICD-10-CM | POA: Diagnosis not present

## 2020-01-20 DIAGNOSIS — Z20822 Contact with and (suspected) exposure to covid-19: Secondary | ICD-10-CM | POA: Diagnosis not present

## 2020-01-20 MED ORDER — KETOROLAC TROMETHAMINE 60 MG/2ML IM SOLN
60.0000 mg | Freq: Once | INTRAMUSCULAR | Status: AC
  Administered 2020-01-20: 60 mg via INTRAMUSCULAR

## 2020-01-20 NOTE — Patient Instructions (Signed)
We'll be in touch with COVID results.  Please quarantine until you receive these results.  You may continue fioricet as needed for headache.  The change in your headache pattern and menstrual cycle may mean that you are nearing menopause as well.

## 2020-01-20 NOTE — Assessment & Plan Note (Signed)
Toradol injection given today  She may continue fioricet as needed.  COVID swab completed today as she has had some sinus congestion along with some chills a couple of days ago.  Suspicion for this is pretty low though. Instructed to contact clinic if developing any new or worsening symptoms.

## 2020-01-20 NOTE — Progress Notes (Signed)
Analyse Candace Greene - 49 y.o. female MRN [id number]  Date of birth: 08-13-1970  Subjective Chief Complaint  Patient presents with  . Headache    HPI Candace Greene is a 49 y.o. female here today with complaint of headache.  She does have a history of recurrent headaches.  This headache started about 5 days ago.  Similar to previous headaches. She has tried fioricet with some improvement but has not resolved.  She has had some left ear pain and sinus congestion and pain. She had not had any fever but had chills a couple of nights ago.  She has not had respiratory symptoms, nausea, vomiting, diarrhea.   ROS:  A comprehensive ROS was completed and negative except as noted per HPI  No Known Allergies  Past Medical History:  Diagnosis Date  . Heel spur    right  . Pulmonary embolus [facility]     Past Surgical History:  Procedure Laterality Date  . No prior surgery      Social History   Socioeconomic History  . Marital status: Married    Spouse name: Not on file  . Number of children: 2  . Years of education: Not on file  . Highest education level: Not on file  Occupational History    Comment: [employer]  Tobacco Use  . Smoking status: Never Smoker  . Smokeless tobacco: Never Used  Vaping Use  . Vaping Use: Never used  Substance and Sexual Activity  . Alcohol use: Yes    Alcohol/week: 0.0 standard drinks    Comment: 3 glasses wine per night  . Drug use: No  . Sexual activity: Not on file  Other Topics Concern  . Not on file  Social History Narrative  . Not on file   Social Determinants of Health   Financial Resource Strain:   . Difficulty of Paying Living Expenses: Not on file  Food Insecurity:   . Worried About Charity fundraiser in the Last Year: Not on file  . Ran Out of Food in the Last Year: Not on file  Transportation Needs:   . Lack of Transportation (Medical): Not on file  . Lack of Transportation (Non-Medical): Not on file  Physical Activity:   . Days of  Exercise per Week: Not on file  . Minutes of Exercise per Session: Not on file  Stress:   . Feeling of Stress : Not on file  Social Connections:   . Frequency of Communication with Friends and Family: Not on file  . Frequency of Social Gatherings with Friends and Family: Not on file  . Attends Religious Services: Not on file  . Active Member of Clubs or Organizations: Not on file  . Attends Archivist Meetings: Not on file  . Marital Status: Not on file    Family History  Problem Relation Age of Onset  . Stroke Father        <60  . Heart attack Father   . Birth defects Other   . Hyperlipidemia Other   . Breast cancer Maternal Grandmother   . Breast cancer Maternal Aunt   . Cancer Other        GF    Health Maintenance  Topic Date Due  . Hepatitis C Screening  Never done  . PAP SMEAR-Modifier  05/03/2018  . TETANUS/TDAP  05/04/2023  . INFLUENZA VACCINE  Completed  . COVID-19 Vaccine  Completed  . HIV Screening  Completed     ----------------------------------------------------------------------------------------------------------------------------------------------------------------------------------------------------------------- Physical Exam  Ht 5' 1.81" (1.57 m)   Wt 204 lb 3.2 oz (92.6 kg)   SpO2 100%   BMI 37.58 kg/m   Physical Exam Constitutional:      Appearance: She is well-developed.  HENT:     Head: Normocephalic and atraumatic.     Right Ear: Tympanic membrane normal.     Left Ear: Tympanic membrane normal.  Eyes:     General: No scleral icterus. Cardiovascular:     Rate and Rhythm: Normal rate and regular rhythm.  Pulmonary:     Effort: Pulmonary effort is normal.     Breath sounds: Normal breath sounds.  Musculoskeletal:     Cervical back: Neck supple.  Neurological:     General: No focal deficit present.     Mental Status: She is alert and oriented to person, place, and time.     Cranial Nerves: No cranial nerve deficit.      Motor: No weakness.  Psychiatric:        Mood and Affect: Mood normal.        Behavior: Behavior normal.     ------------------------------------------------------------------------------------------------------------------------------------------------------------------------------------------------------------------- Assessment and Plan  Headache disorder Toradol injection given today  She may continue fioricet as needed.  COVID swab completed today as she has had some sinus congestion along with some chills a couple of days ago.  Suspicion for this is pretty low though. Instructed to contact clinic if developing any new or worsening symptoms.    Meds ordered this encounter  Medications  . ketorolac (TORADOL) injection 60 mg    No follow-ups on file.    This visit occurred during the SARS-CoV-2 public health emergency.  Safety protocols were in place, including screening questions prior to the visit, additional usage of staff PPE, and extensive cleaning of exam room while observing appropriate contact time as indicated for disinfecting solutions.

## 2020-01-22 ENCOUNTER — Other Ambulatory Visit: Payer: Self-pay | Admitting: Family Medicine

## 2020-01-22 ENCOUNTER — Telehealth: Payer: Self-pay

## 2020-01-22 LAB — SARS-COV-2, NAA 2 DAY TAT

## 2020-01-22 LAB — NOVEL CORONAVIRUS, NAA: SARS-CoV-2, NAA: NOT DETECTED

## 2020-01-22 MED ORDER — AMOXICILLIN 500 MG PO CAPS: 500.0000 mg | ORAL_CAPSULE | Freq: Three times a day (TID) | ORAL | 0 refills | Status: DC

## 2020-01-22 NOTE — Telephone Encounter (Signed)
Pt called stating that her ears and throat continues to hurt. Pt was informed of negative Covid results. Requesting recommendation / antibiotic from provider. Pls advise, thanks.

## 2020-01-22 NOTE — Telephone Encounter (Signed)
Task completed. At patient's request left a detailed vm msg regarding abx sent to the pharmacy. Direct call back info provided.

## 2020-01-22 NOTE — Telephone Encounter (Signed)
Rx for amoxicillin sent in.

## 2020-02-19 ENCOUNTER — Encounter: Payer: Self-pay | Admitting: Physician Assistant

## 2020-02-19 ENCOUNTER — Telehealth: Payer: BC Managed Care – PPO | Admitting: Physician Assistant

## 2020-02-19 ENCOUNTER — Telehealth (INDEPENDENT_AMBULATORY_CARE_PROVIDER_SITE_OTHER): Payer: BC Managed Care – PPO | Admitting: Physician Assistant

## 2020-02-19 VITALS — Ht 62.0 in | Wt 204.0 lb

## 2020-02-19 DIAGNOSIS — M79604 Pain in right leg: Secondary | ICD-10-CM | POA: Diagnosis not present

## 2020-02-19 DIAGNOSIS — J0110 Acute frontal sinusitis, unspecified: Secondary | ICD-10-CM | POA: Diagnosis not present

## 2020-02-19 DIAGNOSIS — Z86711 Personal history of pulmonary embolism: Secondary | ICD-10-CM

## 2020-02-19 MED ORDER — AMOXICILLIN-POT CLAVULANATE 875-125 MG PO TABS: 1.0000 | ORAL_TABLET | Freq: Two times a day (BID) | ORAL | 0 refills | Status: DC

## 2020-02-19 MED ORDER — FLUTICASONE PROPIONATE 50 MCG/ACT NA SUSP: 2.0000 | Freq: Every day | NASAL | 2 refills | Status: AC

## 2020-02-19 MED ORDER — MELOXICAM 15 MG PO TABS: ORAL_TABLET | ORAL | 3 refills | Status: DC

## 2020-02-19 NOTE — Progress Notes (Addendum)
Patient ID: Candace Greene, female   DOB: Jun 29, 1970, 49 y.o.   MRN: [id number] .Marland KitchenVirtual Visit via Telephone Note  I connected with [name] on 02/19/20 at  7:30 AM EDT by telephone and verified that I am speaking with the correct person using two identifiers.  Location: Patient: home Provider: clinic   I discussed the limitations, risks, security and privacy concerns of performing an evaluation and management service by telephone and the availability of in person appointments. I also discussed with the patient that there may be a patient responsible charge related to this service. The patient expressed understanding and agreed to proceed.   History of Present Illness: Patient is a 49 year old female. Comes into the clinic with worsening headaches and sinus pressure for the last week. She admits that she originally started having URI symptoms a month ago on 9/27 she was seen by Dr. Kerby Nora. She was Covid tested and negative. She did seem to get a little bit better but then symptoms got worse again. Her headache is frontal. She does admit to some sinus pressure and drainage. She feels very congested. She does have sore throat and some left ear pain she denies any fever, chills, GI symptoms, loss of smell or taste, shortness of breath. Over-the-counter medicine does not seem to be helping.  She also mentions some right leg pain that seems to be more lower upper thigh posteriorly. She denies any swelling, redness, warmth. She is able to walk without difficulty. She does have a history of blood clots. She denies any medication changes, trauma, injury, travel. It does seem to be getting better today.  .. Active Ambulatory Problems    Diagnosis Date Noted  . Wrist tendonitis 11/28/2014  . History of pulmonary embolism 04/17/2015  . Abnormal echocardiogram 05/06/2015  . Elevated lipase 06/15/2015  . Generalized abdominal pain 06/15/2015  . Ovarian cyst 06/16/2015  . Uterine fibroid  06/16/2015  . Pica 09/01/2015  . DUB (dysfunctional uterine bleeding) 09/01/2015  . Iron deficiency anemia 09/01/2015  . Menorrhagia with irregular cycle 09/01/2015  . Fatty liver 11/04/2015  . Right leg pain 06/27/2016  . Carpal tunnel syndrome 05/11/2017  . Cubital tunnel syndrome 05/11/2017  . Ganglion cyst of dorsum of right wrist 04/01/2019  . Intertrigo 04/01/2019  . 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 Paronychia of great toe, right 05/28/2019  . Slow transit constipation 06/21/2019  . Headache disorder 10/07/2019   Resolved Ambulatory Problems    Diagnosis Date Noted  . KNEE PAIN 03/16/2010  . TOE PAIN 03/16/2010  . Irregular bleeding 11/28/2014  . Acute pulmonary embolism ([place]) 04/17/2015  . Chest pain 05/06/2015  . Atypical chest pain 06/10/2015  . Epigastric abdominal tenderness on direct palpation 06/10/2015  . Abdominal guarding 06/15/2015  . Fibroid, uterine 09/01/2015  . Right foot pain 08/09/2016  . Positive Murphy's Sign 05/08/2017  . Pneumonia of left lower lobe due to infectious organism 09/12/2017   Past Medical History:  Diagnosis Date  . Heel spur   . Pulmonary embolus ([facility])    Reviewed med, allergy, problem list.     Observations/Objective: No acute distress Normal mood.  No labored breathing.   .. Today's Vitals   02/19/20 0716  Weight: 204 lb (92.5 kg)  Height: 5\' 2"  (1.575 m)   Body mass index is 37.31 kg/m.    Assessment and Plan: .Marland KitchenJosette was seen today for headache.  Diagnoses and all orders for this visit:  Acute  non-recurrent frontal sinusitis -     amoxicillin-clavulanate (AUGMENTIN) 875-125 MG tablet; Take 1 tablet by mouth 2 (two) times daily. -     fluticasone (FLONASE) 50 MCG/ACT nasal spray; Place 2 sprays into both nostrils daily.  Right leg pain -     meloxicam (MOBIC) 15 MG tablet; One tab PO qAM with a meal for 2 weeks, then daily prn pain.  History of pulmonary  embolism   Pt started with URI symptoms 1 month ago. Got better and then got worse again. consistent with sinusitis. Treated with augmentin and flonase. Follow up as needed or if not improving or worsening.   Right leg pain that is non-specific. Pt states doing better this morning. Ok to use mobic. Could be muscle strain etc. Discuss hamstring stretches. Sent refill.  Discussed warning signs of DVT such as worsening pain, redness, warmth, swelling, trouble walking. Please let us know if not improving.     Follow Up Instructions:    I discussed the assessment and treatment plan with the patient. The patient was provided an opportunity to ask questions and all were answered. The patient agreed with the plan and demonstrated an understanding of the instructions.   The patient was advised to call back or seek an in-person evaluation if the symptoms worsen or if the condition fails to improve as anticipated.  I provided 20 minutes of non-face-to-face time during this encounter.   Iran Planas, PA-C

## 2020-02-19 NOTE — Progress Notes (Deleted)
Started a week ago: Headaches Sore Throat Ear pain (left)  No congestion No body aches No GI symptoms  Has been taking tylenol - helps but symptoms come back

## 2020-04-21 ENCOUNTER — Ambulatory Visit: Payer: BC Managed Care – PPO | Admitting: Family Medicine

## 2020-04-30 ENCOUNTER — Other Ambulatory Visit: Payer: Self-pay

## 2020-04-30 ENCOUNTER — Ambulatory Visit (INDEPENDENT_AMBULATORY_CARE_PROVIDER_SITE_OTHER): Payer: BC Managed Care – PPO | Admitting: Nurse Practitioner

## 2020-04-30 ENCOUNTER — Encounter: Payer: Self-pay | Admitting: Nurse Practitioner

## 2020-04-30 VITALS — BP 146/67 | HR 71 | Ht 62.0 in | Wt 206.0 lb

## 2020-04-30 DIAGNOSIS — S161XXD Strain of muscle, fascia and tendon at neck level, subsequent encounter: Secondary | ICD-10-CM

## 2020-04-30 DIAGNOSIS — G43901 Migraine, unspecified, not intractable, with status migrainosus: Secondary | ICD-10-CM | POA: Diagnosis not present

## 2020-04-30 MED ORDER — KETOROLAC TROMETHAMINE 60 MG/2ML IM SOLN
60.0000 mg | Freq: Once | INTRAMUSCULAR | Status: AC
  Administered 2020-04-30: 60 mg via INTRAMUSCULAR

## 2020-04-30 MED ORDER — BUTALBITAL-APAP-CAFFEINE 50-325-40 MG PO TABS: 1.0000 | ORAL_TABLET | Freq: Four times a day (QID) | ORAL | 3 refills | Status: AC | PRN

## 2020-04-30 MED ORDER — SUMATRIPTAN SUCCINATE 6 MG/0.5ML ~~LOC~~ SOLN
6.0000 mg | Freq: Once | SUBCUTANEOUS | Status: AC
  Administered 2020-04-30: 6 mg via SUBCUTANEOUS

## 2020-04-30 MED ORDER — DEXAMETHASONE SODIUM PHOSPHATE 10 MG/ML IJ SOLN
10.0000 mg | Freq: Once | INTRAMUSCULAR | Status: AC
  Administered 2020-04-30: 10 mg via INTRAMUSCULAR

## 2020-04-30 MED ORDER — RIZATRIPTAN BENZOATE 10 MG PO TABS: 10.0000 mg | ORAL_TABLET | ORAL | 11 refills | Status: DC | PRN

## 2020-04-30 MED ORDER — CYCLOBENZAPRINE HCL 10 MG PO TABS: 10.0000 mg | ORAL_TABLET | Freq: Three times a day (TID) | ORAL | 2 refills | Status: DC | PRN

## 2020-04-30 NOTE — Progress Notes (Signed)
Patient reports headache started on Saturday and has been consistent since.  She has tried different medications and nothing has helped.

## 2020-04-30 NOTE — Patient Instructions (Signed)
Menopause Menopause is the normal time of life when menstrual periods stop completely. It is usually confirmed by 12 months without a menstrual period. The transition to menopause (perimenopause) most often happens between the ages of 45 and 55. During perimenopause, hormone levels change in your body, which can cause symptoms and affect your health. Menopause may increase your risk for:  Loss of bone (osteoporosis), which causes bone breaks (fractures).  Depression.  Hardening and narrowing of the arteries (atherosclerosis), which can cause heart attacks and strokes. What are the causes? This condition is usually caused by a natural change in hormone levels that happens as you get older. The condition may also be caused by surgery to remove both ovaries (bilateral oophorectomy). What increases the risk? This condition is more likely to start at an earlier age if you have certain medical conditions or treatments, including:  A tumor of the pituitary gland in the brain.  A disease that affects the ovaries and hormone production.  Radiation treatment for cancer.  Certain cancer treatments, such as chemotherapy or hormone (anti-estrogen) therapy.  Heavy smoking and excessive alcohol use.  Family history of Candace Greene menopause. This condition is also more likely to develop earlier in women who are very thin. What are the signs or symptoms? Symptoms of this condition include:  Hot flashes.  Irregular menstrual periods.  Night sweats.  Changes in feelings about sex. This could be a decrease in sex drive or an increased comfort around your sexuality.  Vaginal dryness and thinning of the vaginal walls. This may cause painful intercourse.  Dryness of the skin and development of wrinkles.  Headaches.  Problems sleeping (insomnia).  Mood swings or irritability.  Memory problems.  Weight gain.  Hair growth on the face and chest.  Bladder infections or problems with urinating. How  is this diagnosed? This condition is diagnosed based on your medical history, a physical exam, your age, your menstrual history, and your symptoms. Hormone tests may also be done. How is this treated? In some cases, no treatment is needed. You and your health care provider should make a decision together about whether treatment is necessary. Treatment will be based on your individual condition and preferences. Treatment for this condition focuses on managing symptoms. Treatment may include:  Menopausal hormone therapy (MHT).  Medicines to treat specific symptoms or complications.  Acupuncture.  Vitamin or herbal supplements. Before starting treatment, make sure to let your health care provider know if you have a personal or family history of:  Heart disease.  Breast cancer.  Blood clots.  Diabetes.  Osteoporosis. Follow these instructions at home: Lifestyle  Do not use any products that contain nicotine or tobacco, such as cigarettes and e-cigarettes. If you need help quitting, ask your health care provider.  Get at least 30 minutes of physical activity on 5 or more days each week.  Avoid alcoholic and caffeinated beverages, as well as spicy foods. This may help prevent hot flashes.  Get 7-8 hours of sleep each night.  If you have hot flashes, try: ? Dressing in layers. ? Avoiding things that may trigger hot flashes, such as spicy food, warm places, or stress. ? Taking slow, deep breaths when a hot flash starts. ? Keeping a fan in your home and office.  Find ways to manage stress, such as deep breathing, meditation, or journaling.  Consider going to group therapy with other women who are having menopause symptoms. Ask your health care provider about recommended group therapy meetings. Eating and   drinking  Eat a healthy, balanced diet that contains whole grains, lean protein, low-fat dairy, and plenty of fruits and vegetables.  Your health care provider may recommend  adding more soy to your diet. Foods that contain soy include tofu, tempeh, and soy milk.  Eat plenty of foods that contain calcium and vitamin D for bone health. Items that are rich in calcium include low-fat milk, yogurt, beans, almonds, sardines, broccoli, and kale. Medicines  Take over-the-counter and prescription medicines only as told by your health care provider.  Talk with your health care provider before starting any herbal supplements. If prescribed, take vitamins and supplements as told by your health care provider. These may include: ? Calcium. Women age 50 and older should get 1,200 mg (milligrams) of calcium every day. ? Vitamin D. Women need 600-800 International Units of vitamin D each day. ? Vitamins B12 and B6. Aim for 50 micrograms of B12 and 1.5 mg of B6 each day. General instructions  Keep track of your menstrual periods, including: ? When they occur. ? How heavy they are and how long they last. ? How much time passes between periods.  Keep track of your symptoms, noting when they start, how often you have them, and how long they last.  Use vaginal lubricants or moisturizers to help with vaginal dryness and improve comfort during sex.  Keep all follow-up visits as told by your health care provider. This is important. This includes any group therapy or counseling. Contact a health care provider if:  You are still having menstrual periods after age 50.  You have pain during sex.  You have not had a period for 12 months and you develop vaginal bleeding. Get help right away if:  You have: ? Severe depression. ? Excessive vaginal bleeding. ? Pain when you urinate. ? A fast or irregular heart beat (palpitations). ? Severe headaches. ? Abdomen (abdominal) pain or severe indigestion.  You fell and you think you have a broken bone.  You develop leg or chest pain.  You develop vision problems.  You feel a lump in your breast. Summary  Menopause is the normal  time of life when menstrual periods stop completely. It is usually confirmed by 12 months without a menstrual period.  The transition to menopause (perimenopause) most often happens between the ages of 24 and 51.  Symptoms can be managed through medicines, lifestyle changes, and complementary therapies such as acupuncture.  Eat a balanced diet that is rich in nutrients to promote bone health and heart health and to manage symptoms during menopause. This information is not intended to replace advice given to you by your health care provider. Make sure you discuss any questions you have with your health care provider. Document Revised: 03/24/2017 Document Reviewed: 05/14/2016 Elsevier Patient Education  2020 [employer]. Migraine Headache A migraine headache is an intense, throbbing pain on one side or both sides of the head. Migraine headaches may also cause other symptoms, such as nausea, vomiting, and sensitivity to light and noise. A migraine headache can last from 4 hours to 3 days. Talk with your doctor about what things may bring on (trigger) your migraine headaches. What are the causes? The exact cause of this condition is not known. However, a migraine may be caused when nerves in the brain become irritated and release chemicals that cause inflammation of blood vessels. This inflammation causes pain. This condition may be triggered or caused by:  Drinking alcohol.  Smoking.  Taking medicines, such as: ?  Medicine used to treat chest pain (nitroglycerin). ? Birth control pills. ? Estrogen. ? Certain blood pressure medicines.  Eating or drinking products that contain nitrates, glutamate, aspartame, or tyramine. Aged cheeses, chocolate, or caffeine may also be triggers.  Doing physical activity. Other things that may trigger a migraine headache include:  Menstruation.  Pregnancy.  Hunger.  Stress.  Lack of sleep or too much sleep.  Weather changes.  Fatigue. What  increases the risk? The following factors may make you more likely to experience migraine headaches:  Being a certain age. This condition is more common in people who are 90-32 years old.  Being female.  Having a family history of migraine headaches.  Being Caucasian.  Having a mental health condition, such as depression or anxiety.  Being obese. What are the signs or symptoms? The main symptom of this condition is pulsating or throbbing pain. This pain may:  Happen in any area of the head, such as on one side or both sides.  Interfere with daily activities.  Get worse with physical activity.  Get worse with exposure to bright lights or loud noises. Other symptoms may include:  Nausea.  Vomiting.  Dizziness.  General sensitivity to bright lights, loud noises, or smells. Before you get a migraine headache, you may get warning signs (an aura). An aura may include:  Seeing flashing lights or having blind spots.  Seeing bright spots, halos, or zigzag lines.  Having tunnel vision or blurred vision.  Having numbness or a tingling feeling.  Having trouble talking.  Having muscle weakness. Some people have symptoms after a migraine headache (postdromal phase), such as:  Feeling tired.  Difficulty concentrating. How is this diagnosed? A migraine headache can be diagnosed based on:  Your symptoms.  A physical exam.  Tests, such as: ? CT scan or an MRI of the head. These imaging tests can help rule out other causes of headaches. ? Taking fluid from the spine (lumbar puncture) and analyzing it (cerebrospinal fluid analysis, or CSF analysis). How is this treated? This condition may be treated with medicines that:  Relieve pain.  Relieve nausea.  Prevent migraine headaches. Treatment for this condition may also include:  Acupuncture.  Lifestyle changes like avoiding foods that trigger migraine headaches.  Biofeedback.  Cognitive behavioral  therapy. Follow these instructions at home: Medicines  Take over-the-counter and prescription medicines only as told by your health care provider.  Ask your health care provider if the medicine prescribed to you: ? Requires you to avoid driving or using heavy machinery. ? Can cause constipation. You may need to take these actions to prevent or treat constipation:  Drink enough fluid to keep your urine pale yellow.  Take over-the-counter or prescription medicines.  Eat foods that are high in fiber, such as beans, whole grains, and fresh fruits and vegetables.  Limit foods that are high in fat and processed sugars, such as fried or sweet foods. Lifestyle  Do not drink alcohol.  Do not use any products that contain nicotine or tobacco, such as cigarettes, e-cigarettes, and chewing tobacco. If you need help quitting, ask your health care provider.  Get at least 8 hours of sleep every night.  Find ways to manage stress, such as meditation, deep breathing, or yoga. General instructions      Keep a journal to find out what may trigger your migraine headaches. For example, write down: ? What you eat and drink. ? How much sleep you get. ? Any change to your  diet or medicines.  If you have a migraine headache: ? Avoid things that make your symptoms worse, such as bright lights. ? It may help to lie down in a dark, quiet room. ? Do not drive or use heavy machinery. ? Ask your health care provider what activities are safe for you while you are experiencing symptoms.  Keep all follow-up visits as told by your health care provider. This is important. Contact a health care provider if:  You develop symptoms that are different or more severe than your usual migraine headache symptoms.  You have more than 15 headache days in one month. Get help right away if:  Your migraine headache becomes severe.  Your migraine headache lasts longer than 72 hours.  You have a fever.  You have  a stiff neck.  You have vision loss.  Your muscles feel weak or like you cannot control them.  You start to lose your balance often.  You have trouble walking.  You faint.  You have a seizure. Summary  A migraine headache is an intense, throbbing pain on one side or both sides of the head. Migraines may also cause other symptoms, such as nausea, vomiting, and sensitivity to light and noise.  This condition may be treated with medicines and lifestyle changes. You may also need to avoid certain things that trigger a migraine headache.  Keep a journal to find out what may trigger your migraine headaches.  Contact your health care provider if you have more than 15 headache days in a month or you develop symptoms that are different or more severe than your usual migraine headache symptoms. This information is not intended to replace advice given to you by your health care provider. Make sure you discuss any questions you have with your health care provider. Document Revised: 08/03/2018 Document Reviewed: 05/24/2018 Elsevier Patient Education  [place].

## 2020-04-30 NOTE — Progress Notes (Signed)
Acute Office Visit  Subjective:    Patient ID: Candace Greene, female    DOB: 06/04/1970, 50 y.o.   MRN: [id number]  No chief complaint on file.   HPI Patient is in today for migraine headache that has been present since last Saturday. She reports that the headache started on the left side of her head but as time has progressed it has moved to the right side. SHe has also noticed spasms in her neck muscles on the right side. She endorses sensitivity to light and sound, nausea, tenderness to the scalp in the area of the headache, and significant pressure.   She started taking Tylenol for the pain initially, but was able to refill her Fioricet and tried that, but neither were helpful for her headaches. She is not having any other symptoms such as cough, congestion, rhinorrhea, shortness of breath, weakness, or difficulty speaking.   She reports that she has been having more migraines recently (in the past year or so) than she ever did before. This causes her to have to call out of work due to the severity and that results in points against her with work. In the past she had FMLA to allow intermittent time off for migraines. She is interested in looking into this again.  Past Medical History:  Diagnosis Date  . Heel spur    right  . Pulmonary embolus [facility])     Past Surgical History:  Procedure Laterality Date  . No prior surgery      Family History  Problem Relation Age of Onset  . Stroke Father        <60  . Heart attack Father   . Birth defects Other   . Hyperlipidemia Other   . Breast cancer Maternal Grandmother   . Breast cancer Maternal Aunt   . Cancer Other        GF    Social History   Socioeconomic History  . Marital status: Married    Spouse name: Not on file  . Number of children: 2  . Years of education: Not on file  . Highest education level: Not on file  Occupational History    Comment: [employer]  Tobacco Use  . Smoking status: Never Smoker  . Smokeless  tobacco: Never Used  Vaping Use  . Vaping Use: Never used  Substance and Sexual Activity  . Alcohol use: Yes    Alcohol/week: 0.0 standard drinks    Comment: 3 glasses wine per night  . Drug use: No  . Sexual activity: Not on file  Other Topics Concern  . Not on file  Social History Narrative  . Not on file   Social Determinants of Health   Financial Resource Strain: Not on file  Food Insecurity: Not on file  Transportation Needs: Not on file  Physical Activity: Not on file  Stress: Not on file  Social Connections: Not on file  Intimate Partner Violence: Not on file    Outpatient Medications Prior to Visit  Medication Sig Dispense Refill  . amoxicillin-clavulanate (AUGMENTIN) 875-125 MG tablet Take 1 tablet by mouth 2 (two) times daily. 20 tablet 0  . butalbital-acetaminophen-caffeine (FIORICET) 50-325-40 MG tablet Take 1-2 tablets by mouth every 6 (six) hours as needed for headache. 20 tablet 1  . clotrimazole-betamethasone (LOTRISONE) cream Apply 1 application topically 2 (two) times daily. 90 g 0  . cyclobenzaprine (FLEXERIL) 10 MG tablet Take 1 tablet (10 mg total) by mouth 3 (three) times daily as needed for  muscle spasms. 30 tablet 0  . fluticasone (FLONASE) 50 MCG/ACT nasal spray Place 2 sprays into both nostrils daily. 16 g 2  . meloxicam (MOBIC) 15 MG tablet One tab PO qAM with a meal for 2 weeks, then daily prn pain. 30 tablet 3  . nystatin (MYCOSTATIN/NYSTOP) powder Apply 1 application topically 3 (three) times daily. 15 g 0   No facility-administered medications prior to visit.    No Known Allergies  Review of Systems All review of systems negative except what is listed in the HPI     Objective:    Physical Exam Vitals and nursing note reviewed.  Constitutional:      Appearance: She is well-developed. She is ill-appearing.  HENT:     Head: Normocephalic.     Mouth/Throat:     Mouth: Mucous membranes are moist.     Pharynx: Oropharynx is clear.  Eyes:      General: No visual field deficit.    Extraocular Movements: Extraocular movements intact.     Right eye: Normal extraocular motion and no nystagmus.     Left eye: Normal extraocular motion and no nystagmus.     Pupils: Pupils are equal, round, and reactive to light.     Right eye: Pupil is round and reactive.     Left eye: Pupil is round and reactive.  Neck:     Comments: Spasms noted in trapezius muscles R>L Cardiovascular:     Rate and Rhythm: Normal rate and regular rhythm.     Heart sounds: Normal heart sounds.  Pulmonary:     Effort: Pulmonary effort is normal.     Breath sounds: Normal breath sounds.  Musculoskeletal:     Cervical back: Muscular tenderness present. Decreased range of motion.  Lymphadenopathy:     Cervical: No cervical adenopathy.  Skin:    General: Skin is warm and dry.     Capillary Refill: Capillary refill takes less than 2 seconds.  Neurological:     Mental Status: She is alert.     GCS: GCS eye subscore is 4. GCS verbal subscore is 5. GCS motor subscore is 6.     Cranial Nerves: No cranial nerve deficit, dysarthria or facial asymmetry.     Sensory: No sensory deficit.     Motor: No weakness.     Coordination: Coordination normal.     Gait: Gait normal.  Psychiatric:        Mood and Affect: Mood normal.        Speech: Speech normal.        Behavior: Behavior normal.     There were no vitals taken for this visit. Wt Readings from Last 3 Encounters:  02/19/20 204 lb (92.5 kg)  01/20/20 204 lb 3.2 oz (92.6 kg)  12/25/19 200 lb (90.7 kg)    Health Maintenance Due  Topic Date Due  . Hepatitis C Screening  Never done  . PAP SMEAR-Modifier  05/03/2018  . COVID-19 Vaccine (3 - Moderna risk 4-dose series) 08/19/2019    There are no preventive care reminders to display for this patient.   Lab Results  Component Value Date   TSH 1.54 11/16/2018   Lab Results  Component Value Date   WBC 4.8 11/16/2018   HGB 11.2 (L) 11/16/2018   HCT  32.4 (L) 11/16/2018   MCV 95.9 11/16/2018   PLT 274 11/16/2018   Lab Results  Component Value Date   NA 139 01/11/2018   K 4.0 01/11/2018   CO2  25 01/11/2018   GLUCOSE 92 01/11/2018   BUN 11 01/11/2018   CREATININE 1.04 01/11/2018   BILITOT 0.5 01/11/2018   ALKPHOS 44 12/29/2015   AST 18 01/11/2018   ALT 10 01/11/2018   PROT 7.1 01/11/2018   ALBUMIN 3.7 12/29/2015   CALCIUM 9.2 01/11/2018   ANIONGAP 14 04/18/2015   Lab Results  Component Value Date   CHOL 161 08/15/2013   Lab Results  Component Value Date   HDL 70 08/15/2013   Lab Results  Component Value Date   LDLCALC 78 08/15/2013   Lab Results  Component Value Date   TRIG 64 08/15/2013   Lab Results  Component Value Date   CHOLHDL 2.3 08/15/2013   Lab Results  Component Value Date   HGBA1C 5.3 06/11/2019       Assessment & Plan:    1. Migraine with status migrainosus, not intractable, unspecified migraine type Symptoms and presentation consistent with migraine headache. Will treat in the office today with migraine cocktail to help abort symptoms. Patient does not currently have abortive medication with triptan, discussed this option and patient agrees to try maxalt for abortive treatment. Refill also provided for Fioricet.  Work note provided. 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 I do suspect these may have a component related to possible peri-menopause as she did mention that her menstrual cycles are irregular, which started about the time the headaches seemed to increase in frequency.  Recommend follow-up if headaches continue.  - ketorolac (TORADOL) injection 60 mg - SUMAtriptan (IMITREX) injection 6 mg - dexamethasone (DECADRON) injection 10 mg - rizatriptan (MAXALT) 10 MG tablet; Take 1 tablet (10 mg total) by mouth as needed for migraine. May repeat in 2 hours if needed  Dispense: 18 tablet; Refill: 11 -  butalbital-acetaminophen-caffeine (FIORICET) 50-325-40 MG tablet; Take 1-2 tablets by mouth every 6 (six) hours as needed for headache.  Dispense: 20 tablet; Refill: 3  2. Strain of neck muscle, subsequent encounter Neck muscles tight with evidence of spasm noted in the trapezius R>L. This could be a sequela of the migraine or possibly contributing to the migraine. 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  Recommend rest, heat, and gentle neck exercises to help with muscle spasms. Refills provided on flexeril, which have been helpful in the past.  Follow-up with Dr. Karie Schwalbe if symptoms persist.  - cyclobenzaprine (FLEXERIL) 10 MG tablet; Take 1 tablet (10 mg total) by mouth 3 (three) times daily as needed for muscle spasms.  Dispense: 30 tablet; Refill: 2   Tollie Eth, NP

## 2020-05-01 ENCOUNTER — Encounter: Payer: Self-pay | Admitting: *Deleted

## 2020-05-01 ENCOUNTER — Telehealth: Payer: Self-pay | Admitting: Nurse Practitioner

## 2020-05-01 NOTE — Telephone Encounter (Signed)
Pt called saying that her employer stated she needed to be  written out for longer is she isn't feeling well. She said the note that you wrote was until Monday.

## 2020-05-01 NOTE — Telephone Encounter (Signed)
She would like to be written out until next Wednesday. Her woke is ok with her staying out until Wednesday.  Fax to [phone]

## 2020-05-04 ENCOUNTER — Encounter: Payer: Self-pay | Admitting: *Deleted

## 2020-05-08 ENCOUNTER — Telehealth (INDEPENDENT_AMBULATORY_CARE_PROVIDER_SITE_OTHER): Payer: BC Managed Care – PPO | Admitting: Family Medicine

## 2020-05-08 DIAGNOSIS — J019 Acute sinusitis, unspecified: Secondary | ICD-10-CM | POA: Diagnosis not present

## 2020-05-08 MED ORDER — HYDROCODONE-HOMATROPINE 5-1.5 MG/5ML PO SYRP: 5.0000 mL | ORAL_SOLUTION | Freq: Every evening | ORAL | 0 refills | Status: DC | PRN

## 2020-05-08 MED ORDER — DOXYCYCLINE HYCLATE 100 MG PO TABS: 100.0000 mg | ORAL_TABLET | Freq: Two times a day (BID) | ORAL | 0 refills | Status: DC

## 2020-05-08 NOTE — Progress Notes (Signed)
Virtual Visit via Telephone Note  I connected with Candace Greene on 05/08/20 at  4:20 PM EST by telephone and verified that I am speaking with the correct person using two identifiers.   I discussed the limitations, risks, security and privacy concerns of performing an evaluation and management service by telephone and the availability of in person appointments. I also discussed with the patient that there may be a patient responsible charge related to this service. The patient expressed understanding and agreed to proceed.  Patient location: at home Provider loccation: In office   Subjective:    CC: Sinus congestion  HPI:   Pt reports that she was seen last wk for migraines and was given medication for the migraine which helped.  Overall migraines have improved.  But she says starting on Monday she started to feel like she was sort of getting some cold symptoms. She started experiencing L ear pain pressure, bilateral facial pressure,sneezing, watery eyes. She began  taking coricidin for Cold and Flu. No bodyaches. Hbp,mucinex. Says having some upper back pain. Face feels puffy and getting some mucous. No cough. Voice coming and going.  She has been coughing mostly from drainage but feels like it has been keeping her up at night.  She denies f/s/c/n/v/d   Past medical history, Surgical history, Family history not pertinant except as noted below, Social history, Allergies, and medications have been entered into the medical record, reviewed, and corrections made.   Review of Systems: No fevers, chills, night sweats, weight loss, chest pain, or shortness of breath.   Objective:    General: Speaking clearly in complete sentences without any shortness of breath.  Alert and oriented x3.  Normal judgment. No apparent acute distress.    Impression and Recommendations:    Acute sinusitis-could be viral or even COVID did discuss with her that I would encourage her to actually go and get  tested for COVID if she is able to get swabbed.  We are getting ready to head into the weekend and there is a snowstorm coming so we discussed going ahead and sending over an antibiotic so that over the next couple days if she is not continuing to improve with just her over-the-counter cough and cold medications that she can start the antibiotic if needed or if she feels like she is suddenly getting worse.  Also sent over prescription for cough medication.    I discussed the assessment and treatment plan with the patient. The patient was provided an opportunity to ask questions and all were answered. The patient agreed with the plan and demonstrated an understanding of the instructions.   The patient was advised to call back or seek an in-person evaluation if the symptoms worsen or if the condition fails to improve as anticipated.  I provided 15 minutes of non-face-to-face time during this encounter.   Beatrice Lecher, MD

## 2020-05-08 NOTE — Progress Notes (Signed)
LVM letting pt know that I was calling to do her prescreening.   Pt reports that she was seen last wk for migraines and was given medication for the migraine which helped.   She stated that a few days ago she started experiencing L ear pain pressure, facial pressure,sneezing, watery eyes. She began  taking coricidin Hbp,mucinex.   She denies f/s/c/n/v/d

## 2020-05-12 ENCOUNTER — Telehealth: Payer: Self-pay | Admitting: *Deleted

## 2020-05-12 NOTE — Telephone Encounter (Signed)
LVM advising pt that she can go p/u her medication and the cost will be $10.40.

## 2020-05-12 NOTE — Telephone Encounter (Signed)
Spoke w/Ashley and she informed me that pt's insurance will only cover a 7 day supply and will need a PA if the script is kept the same for a 12 day supply.   She also informed me that they applied a discount card and it will pay for the 12 day supply $10.40.

## 2020-05-18 ENCOUNTER — Other Ambulatory Visit: Payer: Self-pay | Admitting: Family Medicine

## 2020-05-18 DIAGNOSIS — Z1231 Encounter for screening mammogram for malignant neoplasm of breast: Secondary | ICD-10-CM

## 2020-06-08 ENCOUNTER — Ambulatory Visit (INDEPENDENT_AMBULATORY_CARE_PROVIDER_SITE_OTHER): Payer: BC Managed Care – PPO | Admitting: Family Medicine

## 2020-06-08 VITALS — BP 118/77 | HR 56 | Ht 62.0 in | Wt 207.0 lb

## 2020-06-08 DIAGNOSIS — M542 Cervicalgia: Secondary | ICD-10-CM | POA: Diagnosis not present

## 2020-06-08 DIAGNOSIS — R519 Headache, unspecified: Secondary | ICD-10-CM | POA: Diagnosis not present

## 2020-06-08 DIAGNOSIS — G43901 Migraine, unspecified, not intractable, with status migrainosus: Secondary | ICD-10-CM | POA: Insufficient documentation

## 2020-06-08 MED ORDER — ZOLMITRIPTAN 5 MG PO TABS: 5.0000 mg | ORAL_TABLET | ORAL | 0 refills | Status: DC | PRN

## 2020-06-08 MED ORDER — TOPIRAMATE 25 MG PO TABS: ORAL_TABLET | ORAL | 0 refills | Status: DC

## 2020-06-08 NOTE — Progress Notes (Signed)
Established Patient Office Visit  Subjective:  Patient ID: Candace Greene, female    DOB: October 25, 1970  Age: 50 y.o. MRN: [id number]  CC:  Chief Complaint  Patient presents with  . Headache    HPI Candace Greene presents for Headaches.  She says that they have ramped up recently.  The start of the top of her head go down into her neck and then into her back.  She has been having several days in a row with headaches.  Last episode was yesterday though she does not have a headache today.  Typically she will take the Maxalt and get some temporary relief but then the headache seems to come back social follow-up with Fioricet.  She has tried taking a second Maxalt but it just makes her nauseated so quit doing that.  She is also tried some Tylenol which did not really help.  He was actually seen for these headaches about 6 weeks ago by one of my partners.    Past Medical History:  Diagnosis Date  . Heel spur    right  . Pulmonary embolus [facility])     Past Surgical History:  Procedure Laterality Date  . No prior surgery      Family History  Problem Relation Age of Onset  . Stroke Father        <60  . Heart attack Father   . Birth defects Other   . Hyperlipidemia Other   . Breast cancer Maternal Grandmother   . Breast cancer Maternal Aunt   . Cancer Other        GF    Social History   Socioeconomic History  . Marital status: Married    Spouse name: Not on file  . Number of children: 2  . Years of education: Not on file  . Highest education level: Not on file  Occupational History    Comment: [employer]  Tobacco Use  . Smoking status: Never Smoker  . Smokeless tobacco: Never Used  Vaping Use  . Vaping Use: Never used  Substance and Sexual Activity  . Alcohol use: Yes    Alcohol/week: 0.0 standard drinks    Comment: 3 glasses wine per night  . Drug use: No  . Sexual activity: Not on file  Other Topics Concern  . Not on file  Social History Narrative  . Not on file    Social Determinants of Health   Financial Resource Strain: Not on file  Food Insecurity: Not on file  Transportation Needs: Not on file  Physical Activity: Not on file  Stress: Not on file  Social Connections: Not on file  Intimate Partner Violence: Not on file    Outpatient Medications Prior to Visit  Medication Sig Dispense Refill  . butalbital-acetaminophen-caffeine (FIORICET) 50-325-40 MG tablet Take 1-2 tablets by mouth every 6 (six) hours as needed for headache. 20 tablet 3  . cyclobenzaprine (FLEXERIL) 10 MG tablet Take 1 tablet (10 mg total) by mouth 3 (three) times daily as needed for muscle spasms. 30 tablet 2  . fluticasone (FLONASE) 50 MCG/ACT nasal spray Place 2 sprays into both nostrils daily. 16 g 2  . meloxicam (MOBIC) 15 MG tablet One tab PO qAM with a meal for 2 weeks, then daily prn pain. 30 tablet 3  . rizatriptan (MAXALT) 10 MG tablet Take 1 tablet (10 mg total) by mouth as needed for migraine. May repeat in 2 hours if needed 18 tablet 11  . doxycycline (VIBRA-TABS) 100 MG  tablet Take 1 tablet (100 mg total) by mouth 2 (two) times daily. 14 tablet 0  . HYDROcodone-homatropine (HYCODAN) 5-1.5 MG/5ML syrup Take 5 mLs by mouth at bedtime as needed for cough. 60 mL 0   No facility-administered medications prior to visit.    Allergies  Allergen Reactions  . Maxalt [Rizatriptan] Nausea Only    ROS Review of Systems    Objective:    Physical Exam Constitutional:      Appearance: She is well-developed and well-nourished.  HENT:     Head: Normocephalic and atraumatic.  Eyes:     Extraocular Movements: Extraocular movements intact.     Pupils: Pupils are equal, round, and reactive to light.  Cardiovascular:     Rate and Rhythm: Normal rate and regular rhythm.     Heart sounds: Normal heart sounds.  Pulmonary:     Effort: Pulmonary effort is normal.     Breath sounds: Normal breath sounds.  Musculoskeletal:     Cervical back: Neck supple.  Skin:     General: Skin is warm and dry.  Neurological:     Mental Status: She is alert and oriented to person, place, and time.  Psychiatric:        Mood and Affect: Mood and affect normal.        Behavior: Behavior normal.     BP 118/77   Pulse (!) 56   Ht 5\' 2"  (1.575 m)   Wt 207 lb (93.9 kg)   SpO2 100%   BMI 37.86 kg/m  Wt Readings from Last 3 Encounters:  06/08/20 207 lb (93.9 kg)  04/30/20 206 lb (93.4 kg)  02/19/20 204 lb (92.5 kg)     Health Maintenance Due  Topic Date Due  . Hepatitis C Screening  Never done  . PAP SMEAR-Modifier  05/03/2018  . COVID-19 Vaccine (3 - Moderna risk 4-dose series) 08/19/2019    There are no preventive care reminders to display for this patient.  Lab Results  Component Value Date   TSH 1.54 11/16/2018   Lab Results  Component Value Date   WBC 4.8 11/16/2018   HGB 11.2 (L) 11/16/2018   HCT 32.4 (L) 11/16/2018   MCV 95.9 11/16/2018   PLT 274 11/16/2018   Lab Results  Component Value Date   NA 139 01/11/2018   K 4.0 01/11/2018   CO2 25 01/11/2018   GLUCOSE 92 01/11/2018   BUN 11 01/11/2018   CREATININE 1.04 01/11/2018   BILITOT 0.5 01/11/2018   ALKPHOS 44 12/29/2015   AST 18 01/11/2018   ALT 10 01/11/2018   PROT 7.1 01/11/2018   ALBUMIN 3.7 12/29/2015   CALCIUM 9.2 01/11/2018   ANIONGAP 14 04/18/2015   Lab Results  Component Value Date   CHOL 161 08/15/2013   Lab Results  Component Value Date   HDL 70 08/15/2013   Lab Results  Component Value Date   LDLCALC 78 08/15/2013   Lab Results  Component Value Date   TRIG 64 08/15/2013   Lab Results  Component Value Date   CHOLHDL 2.3 08/15/2013   Lab Results  Component Value Date   HGBA1C 5.3 06/11/2019      Assessment & Plan:   Problem List Items Addressed This Visit      Cardiovascular and Mediastinum   Migraine with status migrainosus, not intractable    Do think some of these headaches or migraines but they have been much more persistent without a clear  trigger.  And they seem  to be resistant to treatment.  I am going to go ahead and switch her rescue medication from to see [name] get a go ahead and start Topamax as well and I had like to work-up further with a brain MRI.  They seem to be very much unilateral.  Mostly on that right side.  I do think she has some neck issues that could be contributing as well and we also discussed possible formal physical therapy to help with that as well.      Relevant Medications   topiramate (TOPAMAX) 25 MG tablet   zolmitriptan (ZOMIG) 5 MG tablet   Other Relevant Orders   MR Brain W Wo Contrast     Other   Headache disorder - Primary   Relevant Medications   topiramate (TOPAMAX) 25 MG tablet   zolmitriptan (ZOMIG) 5 MG tablet   Other Relevant Orders   Ambulatory referral to Physical Therapy   MR Brain W Wo Contrast    Other Visit Diagnoses    Neck pain       Relevant Orders   Ambulatory referral to Physical Therapy     Neck pain-I do think this could be contributing to a lot of her discomfort and pain.  I think it could certainly be triggering her migraines as well.  So we will move forward with physical therapy for her neck.  Meds ordered this encounter  Medications  . topiramate (TOPAMAX) 25 MG tablet    Sig: Take 1 tablet (25 mg total) by mouth at bedtime for 7 days, THEN 1 tablet (25 mg total) 2 (two) times daily for 7 days, THEN 2 tablets (50 mg total) 2 (two) times daily for 14 days.    Dispense:  80 tablet    Refill:  0  . zolmitriptan (ZOMIG) 5 MG tablet    Sig: Take 1 tablet (5 mg total) by mouth as needed for migraine. Can repeat dose in 1-2 hours after. No more than 2 tabs in 24 hours.    Dispense:  10 tablet    Refill:  0    Follow-up: Return in about 2 months (around 08/06/2020) for Migraines.    Beatrice Lecher, MD

## 2020-06-09 ENCOUNTER — Encounter: Payer: Self-pay | Admitting: Family Medicine

## 2020-06-09 NOTE — Assessment & Plan Note (Signed)
Do think some of these headaches or migraines but they have been much more persistent without a clear trigger.  And they seem to be resistant to treatment.  I am going to go ahead and switch her rescue medication from to see [name] get a go ahead and start Topamax as well and I had like to work-up further with a brain MRI.  They seem to be very much unilateral.  Mostly on that right side.  I do think she has some neck issues that could be contributing as well and we also discussed possible formal physical therapy to help with that as well.

## 2020-06-19 ENCOUNTER — Telehealth: Payer: Self-pay | Admitting: Family Medicine

## 2020-06-19 NOTE — Telephone Encounter (Signed)
Please call patient and let her know that our initial request for an MRI was declined.  We can move forward with a peer to peer review I just wanted to see if she was feeling any better if she is doing better than we can always hold off but if she is not and still having persistent headaches then I can move forward with contacting them to do an appeal.

## 2020-06-22 ENCOUNTER — Other Ambulatory Visit: Payer: Self-pay

## 2020-06-22 ENCOUNTER — Ambulatory Visit (INDEPENDENT_AMBULATORY_CARE_PROVIDER_SITE_OTHER): Payer: BC Managed Care – PPO

## 2020-06-22 DIAGNOSIS — R519 Headache, unspecified: Secondary | ICD-10-CM

## 2020-06-22 DIAGNOSIS — G43901 Migraine, unspecified, not intractable, with status migrainosus: Secondary | ICD-10-CM

## 2020-06-22 MED ORDER — GADOBUTROL 1 MMOL/ML IV SOLN
9.3000 mL | Freq: Once | INTRAVENOUS | Status: AC | PRN
  Administered 2020-06-22: 10 mL via INTRAVENOUS

## 2020-06-25 MED ORDER — AMOXICILLIN-POT CLAVULANATE 875-125 MG PO TABS: 1.0000 | ORAL_TABLET | Freq: Two times a day (BID) | ORAL | 0 refills | Status: DC

## 2020-07-03 ENCOUNTER — Other Ambulatory Visit: Payer: Self-pay

## 2020-07-03 ENCOUNTER — Ambulatory Visit: Payer: BC Managed Care – PPO

## 2020-07-03 ENCOUNTER — Ambulatory Visit
Admission: RE | Admit: 2020-07-03 | Discharge: 2020-07-03 | Disposition: A | Discharge status: Home / Self Care | Payer: BC Managed Care – PPO | Source: Ambulatory Visit | Attending: Family Medicine | Admitting: Family Medicine

## 2020-07-03 DIAGNOSIS — Z1231 Encounter for screening mammogram for malignant neoplasm of breast: Secondary | ICD-10-CM

## 2020-07-08 ENCOUNTER — Ambulatory Visit: Payer: BC Managed Care – PPO | Admitting: Rehabilitative and Restorative Service Providers"

## 2020-07-15 ENCOUNTER — Encounter: Payer: BC Managed Care – PPO | Admitting: Rehabilitative and Restorative Service Providers"

## 2020-07-17 ENCOUNTER — Ambulatory Visit: Payer: BC Managed Care – PPO | Admitting: Rehabilitative and Restorative Service Providers"

## 2020-07-22 ENCOUNTER — Encounter: Payer: BC Managed Care – PPO | Admitting: Rehabilitative and Restorative Service Providers"

## 2020-09-21 ENCOUNTER — Telehealth: Payer: Self-pay

## 2020-09-21 NOTE — Telephone Encounter (Signed)
Transition Care Management Unsuccessful Follow-up Telephone Call  Date of discharge and from where:  09/19/2020 from [facility]  Attempts:  1st Attempt  Reason for unsuccessful TCM follow-up call:  Left voice message

## 2020-09-22 NOTE — Telephone Encounter (Signed)
Transition Care Management Unsuccessful Follow-up Telephone Call  Date of discharge and from where:  09/18/20 from [facility]  Attempts:  2nd Attempt  Reason for unsuccessful TCM follow-up call:  Left voice message

## 2020-09-23 NOTE — Telephone Encounter (Signed)
Transition Care Management Unsuccessful Follow-up Telephone Call  Date of discharge and from where:  09/18/20 from [facility]  Attempts:  3rd Attempt  Reason for unsuccessful TCM follow-up call:  Unable to reach patient

## 2020-11-25 ENCOUNTER — Ambulatory Visit (INDEPENDENT_AMBULATORY_CARE_PROVIDER_SITE_OTHER): Payer: BC Managed Care – PPO | Admitting: Family Medicine

## 2020-11-25 ENCOUNTER — Encounter: Payer: Self-pay | Admitting: Family Medicine

## 2020-11-25 VITALS — BP 115/61 | HR 56 | Temp 99.1°F | Ht 62.0 in | Wt 198.0 lb

## 2020-11-25 DIAGNOSIS — M7989 Other specified soft tissue disorders: Secondary | ICD-10-CM

## 2020-11-25 DIAGNOSIS — G43901 Migraine, unspecified, not intractable, with status migrainosus: Secondary | ICD-10-CM

## 2020-11-25 DIAGNOSIS — M25511 Pain in right shoulder: Secondary | ICD-10-CM | POA: Diagnosis not present

## 2020-11-25 LAB — URINALYSIS, ROUTINE W REFLEX MICROSCOPIC
Bilirubin Urine: NEGATIVE
Glucose, UA: NEGATIVE
Hgb urine dipstick: NEGATIVE
Ketones, ur: NEGATIVE
Leukocytes,Ua: NEGATIVE
Nitrite: NEGATIVE
Protein, ur: NEGATIVE
Specific Gravity, Urine: 1.019 (ref 1.001–1.035)
pH: 7 (ref 5.0–8.0)

## 2020-11-25 LAB — D-DIMER, QUANTITATIVE: D-Dimer, Quant: 0.64 mcg/mL FEU — ABNORMAL HIGH (ref <0.50)

## 2020-11-25 MED ORDER — TOPIRAMATE 25 MG PO TABS: ORAL_TABLET | ORAL | 0 refills | Status: DC

## 2020-11-25 MED ORDER — ZOLMITRIPTAN 5 MG PO TABS: 5.0000 mg | ORAL_TABLET | ORAL | 3 refills | Status: DC | PRN

## 2020-11-25 MED ORDER — TOPIRAMATE 50 MG PO TABS: 50.0000 mg | ORAL_TABLET | Freq: Two times a day (BID) | ORAL | 0 refills | Status: DC

## 2020-11-25 NOTE — Progress Notes (Signed)
Acute Office Visit  Subjective:    Patient ID: Candace Greene, female    DOB: 08/14/1970, 50 y.o.   MRN: [id number]  Chief Complaint  Patient presents with   Migraine    HPI Patient is in today for follow-up migraine headaches.   She said it really started flaring about 2 weeks ago.  This last headache was quite severe over the weekend.  She sometimes will get light sensitivity and noise sensitivity and sometimes will even vomit or have diarrhea.  In fact on Saturday she had some vomiting and diarrhea.  She did try to take rizatriptan.  They evidently never filled the zolmitriptan.  But she says she really does not feel like it helped at all.  She also reports leg swelling that she noticed yesterday she said when she got her the shower she noticed a swollen area on the inner thigh slightly posterior to that right knee.  She said this morning when she got up she noticed that area was a little bit better but she was having swelling in the right distal ankle and foot.  She denies any known injury or trauma.  She also reports she has been having some right shoulder pain she says the headaches have mostly been on the right side of her head radiating down into her neck and then down into her shoulder.  She does not have any limitation in range of motion in that shoulder.  No recent injury or trauma  Past Medical History:  Diagnosis Date   Heel spur    right   Pulmonary embolus (HCC)     Past Surgical History:  Procedure Laterality Date   No prior surgery      Family History  Problem Relation Age of Onset   Stroke Father        <60   Heart attack Father    Birth defects Other    Hyperlipidemia Other    Breast cancer Maternal Grandmother    Breast cancer Maternal Aunt    Cancer Other        GF    Social History   Socioeconomic History   Marital status: Married    Spouse name: Not on file   Number of children: 2   Years of education: Not on file   Highest education level:  Not on file  Occupational History    Comment: [employer]  Tobacco Use   Smoking status: Never   Smokeless tobacco: Never  Vaping Use   Vaping Use: Never used  Substance and Sexual Activity   Alcohol use: Yes    Alcohol/week: 0.0 standard drinks    Comment: 3 glasses wine per night   Drug use: No   Sexual activity: Not on file  Other Topics Concern   Not on file  Social History Narrative   Not on file   Social Determinants of Health   Financial Resource Strain: Not on file  Food Insecurity: Not on file  Transportation Needs: Not on file  Physical Activity: Not on file  Stress: Not on file  Social Connections: Not on file  Intimate Partner Violence: Not on file    Outpatient Medications Prior to Visit  Medication Sig Dispense Refill   butalbital-acetaminophen-caffeine (FIORICET) 50-325-40 MG tablet Take 1-2 tablets by mouth every 6 (six) hours as needed for headache. 20 tablet 3   cyclobenzaprine (FLEXERIL) 10 MG tablet Take 1 tablet (10 mg total) by mouth 3 (three) times daily as needed for muscle spasms.  30 tablet 2   fluticasone (FLONASE) 50 MCG/ACT nasal spray Place 2 sprays into both nostrils daily. 16 g 2   meloxicam (MOBIC) 15 MG tablet One tab PO qAM with a meal for 2 weeks, then daily prn pain. 30 tablet 3   zolmitriptan (ZOMIG) 5 MG tablet Take 1 tablet (5 mg total) by mouth as needed for migraine. Can repeat dose in 1-2 hours after. No more than 2 tabs in 24 hours. 10 tablet 0   amoxicillin-clavulanate (AUGMENTIN) 875-125 MG tablet Take 1 tablet by mouth 2 (two) times daily. 20 tablet 0   topiramate (TOPAMAX) 25 MG tablet Take 1 tablet (25 mg total) by mouth at bedtime for 7 days, THEN 1 tablet (25 mg total) 2 (two) times daily for 7 days, THEN 2 tablets (50 mg total) 2 (two) times daily for 14 days. 80 tablet 0   No facility-administered medications prior to visit.    Allergies  Allergen Reactions   Maxalt [Rizatriptan] Nausea Only    Review of Systems      Objective:    Physical Exam Constitutional:      Appearance: She is well-developed.  HENT:     Head: Normocephalic and atraumatic.  Cardiovascular:     Rate and Rhythm: Normal rate and regular rhythm.     Heart sounds: Normal heart sounds.  Pulmonary:     Effort: Pulmonary effort is normal.     Breath sounds: Normal breath sounds.  Musculoskeletal:     Comments: Right shoulder with normal range of motion.  Trace swelling around the right ankle foot.  Dorsal pedal pulse 2+.  No palpable lumps in the thigh or lower leg.  Nontender calf squeeze.  Skin:    General: Skin is warm and dry.  Neurological:     Mental Status: She is alert and oriented to person, place, and time.  Psychiatric:        Behavior: Behavior normal.    BP 115/61   Pulse (!) 56   Temp 99.1 F (37.3 C) (Oral)   Ht '5\' 2"'$  (1.575 m)   Wt 198 lb (89.8 kg)   LMP 11/07/2020   SpO2 100% Comment: on RA  BMI 36.21 kg/m  Wt Readings from Last 3 Encounters:  11/25/20 198 lb (89.8 kg)  06/08/20 207 lb (93.9 kg)  04/30/20 206 lb (93.4 kg)    Health Maintenance Due  Topic Date Due   Pneumococcal Vaccine 86-14 Years old (1 - PCV) Never done   Hepatitis C Screening  Never done   PAP SMEAR-Modifier  05/03/2018   COVID-19 Vaccine (3 - Moderna risk series) 08/19/2019   INFLUENZA VACCINE  11/23/2020    There are no preventive care reminders to display for this patient.   Lab Results  Component Value Date   TSH 1.54 11/16/2018   Lab Results  Component Value Date   WBC 4.8 11/16/2018   HGB 11.2 (L) 11/16/2018   HCT 32.4 (L) 11/16/2018   MCV 95.9 11/16/2018   PLT 274 11/16/2018   Lab Results  Component Value Date   NA 139 01/11/2018   K 4.0 01/11/2018   CO2 25 01/11/2018   GLUCOSE 92 01/11/2018   BUN 11 01/11/2018   CREATININE 1.04 01/11/2018   BILITOT 0.5 01/11/2018   ALKPHOS 44 12/29/2015   AST 18 01/11/2018   ALT 10 01/11/2018   PROT 7.1 01/11/2018   ALBUMIN 3.7 12/29/2015   CALCIUM 9.2  01/11/2018   ANIONGAP 14 04/18/2015  Lab Results  Component Value Date   CHOL 161 08/15/2013   Lab Results  Component Value Date   HDL 70 08/15/2013   Lab Results  Component Value Date   LDLCALC 78 08/15/2013   Lab Results  Component Value Date   TRIG 64 08/15/2013   Lab Results  Component Value Date   CHOLHDL 2.3 08/15/2013   Lab Results  Component Value Date   HGBA1C 5.3 06/11/2019       Assessment & Plan:   Problem List Items Addressed This Visit       Cardiovascular and Mediastinum   Migraine with status migrainosus, not intractable    I would like to get her back on the Topamax she did take the initial taper for 30 days but then did not continue the medication I explained that that is just to get her up to a certain dose and then we would actually continue to refill the medicine and keep her on it for at least 6 to 12 months to try to reduce the frequency of her headaches.  She tolerated it well initially so we will try to start the taper again and send over new continuing prescription to be filled.  Plan to follow-up in 3 to 4 months.       Relevant Medications   topiramate (TOPAMAX) 25 MG tablet   topiramate (TOPAMAX) 50 MG tablet   zolmitriptan (ZOMIG) 5 MG tablet   Other Visit Diagnoses     Right leg swelling    -  Primary   Relevant Orders   D-dimer, quantitative   Urinalysis, Routine w reflex microscopic   Acute pain of right shoulder          Right leg swelling-unclear etiology again no injury or trauma.  Overall she is low risk for DVT but I do not have another good explanation for why she is having right lower extremity swelling.  We will get a stat D-dimer and if positive will work-up further.  Right shoulder pain-unclear etiology she has normal range of motion again no specific injury or trauma.  Will give handout for stretches to do on her own at home if not improving then please let me know.  Meds ordered this encounter  Medications    topiramate (TOPAMAX) 25 MG tablet    Sig: Take 1 tablet (25 mg total) by mouth at bedtime for 7 days, THEN 1 tablet (25 mg total) 2 (two) times daily for 7 days, THEN 2 tablets (50 mg total) 2 (two) times daily for 14 days.    Dispense:  80 tablet    Refill:  0   topiramate (TOPAMAX) 50 MG tablet    Sig: Take 1 tablet (50 mg total) by mouth 2 (two) times daily.    Dispense:  180 tablet    Refill:  0    OK to fill after starts taper   zolmitriptan (ZOMIG) 5 MG tablet    Sig: Take 1 tablet (5 mg total) by mouth as needed for migraine. Can repeat dose in 1-2 hours after. No more than 2 tabs in 24 hours.    Dispense:  12 tablet    Refill:  3     Beatrice Lecher, MD

## 2020-11-25 NOTE — Assessment & Plan Note (Signed)
I would like to get her back on the Topamax she did take the initial taper for 30 days but then did not continue the medication I explained that that is just to get her up to a certain dose and then we would actually continue to refill the medicine and keep her on it for at least 6 to 12 months to try to reduce the frequency of her headaches.  She tolerated it well initially so we will try to start the taper again and send over new continuing prescription to be filled.  Plan to follow-up in 3 to 4 months.

## 2021-02-25 ENCOUNTER — Ambulatory Visit: Payer: BC Managed Care – PPO | Admitting: Family Medicine

## 2021-02-25 ENCOUNTER — Ambulatory Visit (INDEPENDENT_AMBULATORY_CARE_PROVIDER_SITE_OTHER): Payer: BC Managed Care – PPO | Admitting: Family Medicine

## 2021-02-25 ENCOUNTER — Other Ambulatory Visit: Payer: Self-pay

## 2021-02-25 ENCOUNTER — Ambulatory Visit (INDEPENDENT_AMBULATORY_CARE_PROVIDER_SITE_OTHER): Payer: BC Managed Care – PPO

## 2021-02-25 VITALS — BP 121/50 | HR 68 | Ht 62.0 in | Wt 199.0 lb

## 2021-02-25 DIAGNOSIS — M79661 Pain in right lower leg: Secondary | ICD-10-CM

## 2021-02-25 DIAGNOSIS — Z23 Encounter for immunization: Secondary | ICD-10-CM

## 2021-02-25 DIAGNOSIS — M79671 Pain in right foot: Secondary | ICD-10-CM | POA: Diagnosis not present

## 2021-02-25 DIAGNOSIS — M7989 Other specified soft tissue disorders: Secondary | ICD-10-CM | POA: Diagnosis not present

## 2021-02-25 NOTE — Progress Notes (Signed)
Call pt: no sign of blood clot in the leg!!

## 2021-02-25 NOTE — Progress Notes (Signed)
Established Patient Office Visit  Subjective:  Patient ID: Candace Greene, female    DOB: [birth year]  Age: 50 y.o. MRN: [id number]  CC:  Chief Complaint  Patient presents with   Leg Swelling   Migraine    HPI Candace Greene presents for   Right foot pain and swelling times about 2 weeks she denies any known injury or trauma.  She says it hurts over the anterior ankle and over the top of the foot and over the bottom of the arch.  She is also had a little bit of discomfort in the right calf as well.  She is also felt like her right foot has looked swollen.  Said she tried to even rested a little bit today and tried to stay off of it just to see if that helped.  But it really has not.  She says just stretching and pointing her toe causes the foot to feel painful.  Past Medical History:  Diagnosis Date   Heel spur    right   Pulmonary embolus [facility])     Past Surgical History:  Procedure Laterality Date   No prior surgery      Family History  Problem Relation Age of Onset   Stroke Father        <60   Heart attack Father    Birth defects Other    Hyperlipidemia Other    Breast cancer Maternal Grandmother    Breast cancer Maternal Aunt    Cancer Other        GF    Social History   Socioeconomic History   Marital status: Married    Spouse name: Not on file   Number of children: 2   Years of education: Not on file   Highest education level: Not on file  Occupational History    Comment: [employer]  Tobacco Use   Smoking status: Never   Smokeless tobacco: Never  Vaping Use   Vaping Use: Never used  Substance and Sexual Activity   Alcohol use: Yes    Alcohol/week: 0.0 standard drinks    Comment: 3 glasses wine per night   Drug use: No   Sexual activity: Not on file  Other Topics Concern   Not on file  Social History Narrative   Not on file   Social Determinants of Health   Financial Resource Strain: Not on file  Food Insecurity: Not on file   Transportation Needs: Not on file  Physical Activity: Not on file  Stress: Not on file  Social Connections: Not on file  Intimate Partner Violence: Not on file    Outpatient Medications Prior to Visit  Medication Sig Dispense Refill   butalbital-acetaminophen-caffeine (FIORICET) 50-325-40 MG tablet Take 1-2 tablets by mouth every 6 (six) hours as needed for headache. 20 tablet 3   cyclobenzaprine (FLEXERIL) 10 MG tablet Take 1 tablet (10 mg total) by mouth 3 (three) times daily as needed for muscle spasms. 30 tablet 2   fluticasone (FLONASE) 50 MCG/ACT nasal spray Place 2 sprays into both nostrils daily. 16 g 2   meloxicam (MOBIC) 15 MG tablet One tab PO qAM with a meal for 2 weeks, then daily prn pain. 30 tablet 3   topiramate (TOPAMAX) 50 MG tablet Take 1 tablet (50 mg total) by mouth 2 (two) times daily. 180 tablet 0   zolmitriptan (ZOMIG) 5 MG tablet Take 1 tablet (5 mg total) by mouth as needed for migraine. Can repeat dose in  1-2 hours after. No more than 2 tabs in 24 hours. 12 tablet 3   topiramate (TOPAMAX) 25 MG tablet Take 1 tablet (25 mg total) by mouth at bedtime for 7 days, THEN 1 tablet (25 mg total) 2 (two) times daily for 7 days, THEN 2 tablets (50 mg total) 2 (two) times daily for 14 days. 80 tablet 0   No facility-administered medications prior to visit.    Allergies  Allergen Reactions   Maxalt [Rizatriptan] Nausea Only    ROS Review of Systems    Objective:    Physical Exam Vitals reviewed.  Constitutional:      Appearance: She is well-developed.  HENT:     Head: Normocephalic and atraumatic.  Eyes:     Conjunctiva/sclera: Conjunctivae normal.  Cardiovascular:     Rate and Rhythm: Normal rate.  Pulmonary:     Effort: Pulmonary effort is normal.  Musculoskeletal:     Comments: Right calf with some mild tenderness.  She does have some swelling around her right ankle and right foot especially over the top of the foot.  She is tender over the distal  second, third and fourth metatarsals.  Somewhat tender over the anterior ankle.  Nontender over the medial and lateral malleolus.  Ankle and toes with normal range of motion.  No significant bruising.No pitting edema.    Skin:    General: Skin is dry.  Neurological:     Mental Status: She is alert and oriented to person, place, and time.  Psychiatric:        Behavior: Behavior normal.    BP (!) 121/50   Pulse 68   Ht 5\' 2"  (1.575 m)   Wt 199 lb (90.3 kg)   LMP 02/21/2021   SpO2 99%   BMI 36.40 kg/m  Wt Readings from Last 3 Encounters:  02/25/21 199 lb (90.3 kg)  11/25/20 198 lb (89.8 kg)  06/08/20 207 lb (93.9 kg)     Health Maintenance Due  Topic Date Due   Hepatitis C Screening  Never done   Zoster Vaccines- Shingrix (1 of 2) Never done   PAP SMEAR-Modifier  05/03/2018   COVID-19 Vaccine (3 - Moderna risk series) 08/19/2019    There are no preventive care reminders to display for this patient.  Lab Results  Component Value Date   TSH 1.54 11/16/2018   Lab Results  Component Value Date   WBC 4.8 11/16/2018   HGB 11.2 (L) 11/16/2018   HCT 32.4 (L) 11/16/2018   MCV 95.9 11/16/2018   PLT 274 11/16/2018   Lab Results  Component Value Date   NA 139 01/11/2018   K 4.0 01/11/2018   CO2 25 01/11/2018   GLUCOSE 92 01/11/2018   BUN 11 01/11/2018   CREATININE 1.04 01/11/2018   BILITOT 0.5 01/11/2018   ALKPHOS 44 12/29/2015   AST 18 01/11/2018   ALT 10 01/11/2018   PROT 7.1 01/11/2018   ALBUMIN 3.7 12/29/2015   CALCIUM 9.2 01/11/2018   ANIONGAP 14 04/18/2015   Lab Results  Component Value Date   CHOL 161 08/15/2013   Lab Results  Component Value Date   HDL 70 08/15/2013   Lab Results  Component Value Date   LDLCALC 78 08/15/2013   Lab Results  Component Value Date   TRIG 64 08/15/2013   Lab Results  Component Value Date   CHOLHDL 2.3 08/15/2013   Lab Results  Component Value Date   HGBA1C 5.3 06/11/2019  Assessment & Plan:   Problem  List Items Addressed This Visit   None Visit Diagnoses     Flu vaccine need    -  Primary   Relevant Orders   Flu Vaccine QUAD 21mo+IM (Fluarix, Fluzone & Alfiuria Quad PF) (Completed)   Right foot pain       Relevant Orders   DG Foot Complete Right   US Venous Img Lower Unilateral Right (Completed)   Right calf pain       Relevant Orders   US Venous Img Lower Unilateral Right (Completed)   Swelling of right foot       Relevant Orders   US Venous Img Lower Unilateral Right (Completed)       Right calf pain-consider DVT though she does not have any specific risk factors currently but she does have some discomfort in the calf and swelling in the right foot.  So we will get an ultrasound for further evaluation.  Right foot pain-she is very tender over the distal metatarsals and she does have some localized swelling as well as some general swelling around the ankle but no pitting.  I would like to get plain film x-rays just to rule out fracture.  If negative then I do think she might benefit from ASO ankle brace for support for the next 1 to 2 weeks to see if that is helpful and tried to rest it and support it and ice it as needed.  Flu vaccine given today.  No orders of the defined types were placed in this encounter.   Follow-up: No follow-ups on file.    Beatrice Lecher, MD

## 2021-02-26 ENCOUNTER — Encounter: Payer: Self-pay | Admitting: Family Medicine

## 2021-03-01 NOTE — Progress Notes (Signed)
Call patient: X-ray shows no recent fracture or dislocation which is good she does have what looks like a little calcified ligament near the second long bone in the foot.  And she does have a small heel spur.  Nothing specific to explain the swelling though.  So I like to either get her in with Dr. Darene Lamer or with podiatry for further work-up if she is still having pain and swelling.  So please remind her that she is due for her Pap smear and if she has had it done elsewhere then lets call to get a copy of that report.

## 2021-03-23 ENCOUNTER — Other Ambulatory Visit (HOSPITAL_COMMUNITY)
Admission: RE | Admit: 2021-03-23 | Discharge: 2021-03-23 | Disposition: A | Discharge status: Home / Self Care | Payer: BC Managed Care – PPO | Source: Ambulatory Visit | Attending: Family Medicine | Admitting: Family Medicine

## 2021-03-23 ENCOUNTER — Other Ambulatory Visit: Payer: Self-pay

## 2021-03-23 ENCOUNTER — Ambulatory Visit (INDEPENDENT_AMBULATORY_CARE_PROVIDER_SITE_OTHER): Payer: BC Managed Care – PPO | Admitting: Family Medicine

## 2021-03-23 ENCOUNTER — Encounter: Payer: Self-pay | Admitting: Family Medicine

## 2021-03-23 VITALS — BP 127/74 | HR 61 | Ht 62.0 in | Wt 196.0 lb

## 2021-03-23 DIAGNOSIS — N923 Ovulation bleeding: Secondary | ICD-10-CM

## 2021-03-23 DIAGNOSIS — R42 Dizziness and giddiness: Secondary | ICD-10-CM | POA: Diagnosis not present

## 2021-03-23 DIAGNOSIS — Z124 Encounter for screening for malignant neoplasm of cervix: Secondary | ICD-10-CM | POA: Diagnosis not present

## 2021-03-23 NOTE — Progress Notes (Signed)
Established Patient Office Visit  Subjective:  Patient ID: Candace Greene, female    DOB: 03/12/1971  Age: 50 y.o. MRN: [id number]  CC:  Chief Complaint  Patient presents with   Gynecologic Exam    HPI Candace Greene presents for pap smear.  Per our records she is due.  She also reports for the last 2-3 months she has been having some intermenstrual bleeding.  It is a little lighter than her normal period but usually last for several days.  She also reports she has been having some dizziness and sinus pressure. She has also been having a tooth problem. She has been seeing her dentist and they have put her on 2 rounds of antibiotics.    She says the second round of antibiotics did seem to help a little bit more but she is still having some pain and they have recommended a root canal.  During all of this she also developed some dizziness and just felt like sometimes the room or the world was spinning.  It usually just last for for seconds to maybe minutes.  Past Medical History:  Diagnosis Date   Heel spur    right   Pulmonary embolus [facility])     Past Surgical History:  Procedure Laterality Date   No prior surgery      Family History  Problem Relation Age of Onset   Stroke Father        <60   Heart attack Father    Birth defects Other    Hyperlipidemia Other    Breast cancer Maternal Grandmother    Breast cancer Maternal Aunt    Cancer Other        GF    Social History   Socioeconomic History   Marital status: Married    Spouse name: Not on file   Number of children: 2   Years of education: Not on file   Highest education level: Not on file  Occupational History    Comment: [employer]  Tobacco Use   Smoking status: Never   Smokeless tobacco: Never  Vaping Use   Vaping Use: Never used  Substance and Sexual Activity   Alcohol use: Yes    Alcohol/week: 0.0 standard drinks    Comment: 3 glasses wine per night   Drug use: No   Sexual activity: Not on file  Other  Topics Concern   Not on file  Social History Narrative   Not on file   Social Determinants of Health   Financial Resource Strain: Not on file  Food Insecurity: Not on file  Transportation Needs: Not on file  Physical Activity: Not on file  Stress: Not on file  Social Connections: Not on file  Intimate Partner Violence: Not on file    Outpatient Medications Prior to Visit  Medication Sig Dispense Refill   clindamycin (CLEOCIN) 150 MG capsule Take by mouth.     butalbital-acetaminophen-caffeine (FIORICET) 50-325-40 MG tablet Take 1-2 tablets by mouth every 6 (six) hours as needed for headache. 20 tablet 3   cyclobenzaprine (FLEXERIL) 10 MG tablet Take 1 tablet (10 mg total) by mouth 3 (three) times daily as needed for muscle spasms. 30 tablet 2   fluticasone (FLONASE) 50 MCG/ACT nasal spray Place 2 sprays into both nostrils daily. 16 g 2   meloxicam (MOBIC) 15 MG tablet One tab PO qAM with a meal for 2 weeks, then daily prn pain. 30 tablet 3   topiramate (TOPAMAX) 50 MG tablet Take  1 tablet (50 mg total) by mouth 2 (two) times daily. 180 tablet 0   zolmitriptan (ZOMIG) 5 MG tablet Take 1 tablet (5 mg total) by mouth as needed for migraine. Can repeat dose in 1-2 hours after. No more than 2 tabs in 24 hours. 12 tablet 3   No facility-administered medications prior to visit.    Allergies  Allergen Reactions   Maxalt [Rizatriptan] Nausea Only    ROS Review of Systems    Objective:    Physical Exam Vitals reviewed. Exam conducted with a chaperone present.  Constitutional:      Appearance: She is well-developed.  HENT:     Head: Normocephalic and atraumatic.     Right Ear: Tympanic membrane, ear canal and external ear normal.     Left Ear: Tympanic membrane, ear canal and external ear normal.  Eyes:     Conjunctiva/sclera: Conjunctivae normal.  Cardiovascular:     Rate and Rhythm: Normal rate.  Pulmonary:     Effort: Pulmonary effort is normal.  Genitourinary:     Labia:        Right: No rash.        Left: No rash.      Vagina: Normal.     Cervix: Friability and cervical bleeding present. No cervical motion tenderness, discharge or lesion.     Uterus: Normal.      Adnexa:        Right: Tenderness present.      Comments: Tenderness in the RLQ.   Skin:    General: Skin is dry.  Neurological:     Mental Status: She is alert and oriented to person, place, and time.  Psychiatric:        Behavior: Behavior normal.    BP 127/74   Pulse 61   Ht 5\' 2"  (1.575 m)   Wt 196 lb (88.9 kg)   LMP 02/21/2021   SpO2 100%   BMI 35.85 kg/m  Wt Readings from Last 3 Encounters:  03/23/21 196 lb (88.9 kg)  02/25/21 199 lb (90.3 kg)  11/25/20 198 lb (89.8 kg)     Health Maintenance Due  Topic Date Due   Hepatitis C Screening  Never done   Zoster Vaccines- Shingrix (1 of 2) Never done   PAP SMEAR-Modifier  05/03/2018   COVID-19 Vaccine (3 - Moderna risk series) 08/19/2019    There are no preventive care reminders to display for this patient.  Lab Results  Component Value Date   TSH 1.54 11/16/2018   Lab Results  Component Value Date   WBC 4.8 11/16/2018   HGB 11.2 (L) 11/16/2018   HCT 32.4 (L) 11/16/2018   MCV 95.9 11/16/2018   PLT 274 11/16/2018   Lab Results  Component Value Date   NA 139 01/11/2018   K 4.0 01/11/2018   CO2 25 01/11/2018   GLUCOSE 92 01/11/2018   BUN 11 01/11/2018   CREATININE 1.04 01/11/2018   BILITOT 0.5 01/11/2018   ALKPHOS 44 12/29/2015   AST 18 01/11/2018   ALT 10 01/11/2018   PROT 7.1 01/11/2018   ALBUMIN 3.7 12/29/2015   CALCIUM 9.2 01/11/2018   ANIONGAP 14 04/18/2015   Lab Results  Component Value Date   CHOL 161 08/15/2013   Lab Results  Component Value Date   HDL 70 08/15/2013   Lab Results  Component Value Date   LDLCALC 78 08/15/2013   Lab Results  Component Value Date   TRIG 64 08/15/2013   Lab Results  Component Value Date   CHOLHDL 2.3 08/15/2013   Lab Results  Component Value  Date   HGBA1C 5.3 06/11/2019      Assessment & Plan:   Problem List Items Addressed This Visit   None Visit Diagnoses     Screening for cervical cancer    -  Primary   Relevant Orders   Cytology - PAP   Vertigo       Intermenstrual bleeding          Vertigo -sounds most consistent with benign positional vertigo.  Given exercises to do on her own at home if not improving please let me know.  Sounds like her sinus symptoms have actually improved with the recent antibiotics so I do not think it secondary to a sinus infection.  Pap Smear  - performed.  We will call with results once available.  Intermenstrual bleeding-we did discuss getting an ultrasound if it happens again this month would like to give it 1 more month to see if it is a consistent pattern and if it is she may be having some perimenopausal bleeding.    No orders of the defined types were placed in this encounter.   Follow-up: Return if symptoms worsen or fail to improve.    Beatrice Lecher, MD

## 2021-03-26 LAB — CYTOLOGY - PAP
Comment: NEGATIVE
Diagnosis: NEGATIVE
Diagnosis: REACTIVE
High risk HPV: NEGATIVE

## 2021-03-26 NOTE — Progress Notes (Signed)
Your Pap smear is normal. You are negative for HPV as well. Repeat pap smear in 5 years.

## 2021-04-07 ENCOUNTER — Ambulatory Visit (INDEPENDENT_AMBULATORY_CARE_PROVIDER_SITE_OTHER): Payer: BC Managed Care – PPO | Admitting: Family Medicine

## 2021-04-07 ENCOUNTER — Other Ambulatory Visit: Payer: Self-pay

## 2021-04-07 ENCOUNTER — Ambulatory Visit (INDEPENDENT_AMBULATORY_CARE_PROVIDER_SITE_OTHER): Payer: BC Managed Care – PPO

## 2021-04-07 ENCOUNTER — Encounter: Payer: Self-pay | Admitting: Family Medicine

## 2021-04-07 VITALS — BP 124/65 | HR 60 | Ht 62.0 in | Wt 198.0 lb

## 2021-04-07 DIAGNOSIS — W08XXXA Fall from other furniture, initial encounter: Secondary | ICD-10-CM | POA: Diagnosis not present

## 2021-04-07 DIAGNOSIS — M25512 Pain in left shoulder: Secondary | ICD-10-CM

## 2021-04-07 NOTE — Progress Notes (Signed)
G left  Acute Office Visit  Subjective:    Patient ID: Candace Greene, female    DOB: 23-Jan-1971, 50 y.o.   MRN: [id number]  Chief Complaint  Patient presents with   Arm Pain    Injured left arm on . Patient states arm is     HPI Patient is in today for left arm injury.  She actually fell on her left arm and now is having a hard time lifting it.  She was standing on a stepstool putting Christmas lights up so she was reaching upward and fell to the side and landed on her left arm and shoulder.  This occurred yesterday on December 13.  She says now she is having a hard time lifting her arm past 90 degrees.  It is painful. She is supposed to return to work tomorrow.  Past Medical History:  Diagnosis Date   Heel spur    right   Pulmonary embolus [facility])     Past Surgical History:  Procedure Laterality Date   No prior surgery      Family History  Problem Relation Age of Onset   Stroke Father        <60   Heart attack Father    Birth defects Other    Hyperlipidemia Other    Breast cancer Maternal Grandmother    Breast cancer Maternal Aunt    Cancer Other        GF    Social History   Socioeconomic History   Marital status: Married    Spouse name: Not on file   Number of children: 2   Years of education: Not on file   Highest education level: Not on file  Occupational History    Comment: [employer]  Tobacco Use   Smoking status: Never   Smokeless tobacco: Never  Vaping Use   Vaping Use: Never used  Substance and Sexual Activity   Alcohol use: Yes    Alcohol/week: 0.0 standard drinks    Comment: 3 glasses wine per night   Drug use: No   Sexual activity: Not on file  Other Topics Concern   Not on file  Social History Narrative   Not on file   Social Determinants of Health   Financial Resource Strain: Not on file  Food Insecurity: Not on file  Transportation Needs: Not on file  Physical Activity: Not on file  Stress: Not on file  Social Connections: Not on  file  Intimate Partner Violence: Not on file    Outpatient Medications Prior to Visit  Medication Sig Dispense Refill   butalbital-acetaminophen-caffeine (FIORICET) 50-325-40 MG tablet Take 1-2 tablets by mouth every 6 (six) hours as needed for headache. 20 tablet 3   cyclobenzaprine (FLEXERIL) 10 MG tablet Take 1 tablet (10 mg total) by mouth 3 (three) times daily as needed for muscle spasms. 30 tablet 2   fluticasone (FLONASE) 50 MCG/ACT nasal spray Place 2 sprays into both nostrils daily. 16 g 2   meloxicam (MOBIC) 15 MG tablet One tab PO qAM with a meal for 2 weeks, then daily prn pain. 30 tablet 3   topiramate (TOPAMAX) 50 MG tablet Take 1 tablet (50 mg total) by mouth 2 (two) times daily. 180 tablet 0   zolmitriptan (ZOMIG) 5 MG tablet Take 1 tablet (5 mg total) by mouth as needed for migraine. Can repeat dose in 1-2 hours after. No more than 2 tabs in 24 hours. 12 tablet 3   No facility-administered medications prior  to visit.    Allergies  Allergen Reactions   Maxalt [Rizatriptan] Nausea Only    Review of Systems     Objective:    Physical Exam Vitals reviewed.  Constitutional:      Appearance: She is well-developed.  HENT:     Head: Normocephalic and atraumatic.  Eyes:     Conjunctiva/sclera: Conjunctivae normal.  Cardiovascular:     Rate and Rhythm: Normal rate.  Pulmonary:     Effort: Pulmonary effort is normal.  Musculoskeletal:     Comments: Nontender over the clavicle or scapula.  Nontender over the [facility] joint or bicep head.  She is tender over the upper outer shoulder.  Significant bruising or skin laceration.  She is able to extend to about 90 degrees and then has severe pain.  She has pain with crossover as well.  Negative empty can test.  She is able to reach behind her back as long as she moves very slowly.  That hurts a little bit more anteriorly.  Normal external range of motion.  Skin:    General: Skin is dry.     Coloration: Skin is not pale.   Neurological:     Mental Status: She is alert and oriented to person, place, and time.  Psychiatric:        Behavior: Behavior normal.    BP 124/65    Pulse 60    Ht 5\' 2"  (1.575 m)    Wt 198 lb (89.8 kg)    LMP 03/27/2021    SpO2 99%    BMI 36.21 kg/m  Wt Readings from Last 3 Encounters:  04/07/21 198 lb (89.8 kg)  03/23/21 196 lb (88.9 kg)  02/25/21 199 lb (90.3 kg)    Health Maintenance Due  Topic Date Due   Hepatitis C Screening  Never done   Zoster Vaccines- Shingrix (1 of 2) Never done   COVID-19 Vaccine (3 - Moderna risk series) 08/19/2019    There are no preventive care reminders to display for this patient.   Lab Results  Component Value Date   TSH 1.54 11/16/2018   Lab Results  Component Value Date   WBC 4.8 11/16/2018   HGB 11.2 (L) 11/16/2018   HCT 32.4 (L) 11/16/2018   MCV 95.9 11/16/2018   PLT 274 11/16/2018   Lab Results  Component Value Date   NA 139 01/11/2018   K 4.0 01/11/2018   CO2 25 01/11/2018   GLUCOSE 92 01/11/2018   BUN 11 01/11/2018   CREATININE 1.04 01/11/2018   BILITOT 0.5 01/11/2018   ALKPHOS 44 12/29/2015   AST 18 01/11/2018   ALT 10 01/11/2018   PROT 7.1 01/11/2018   ALBUMIN 3.7 12/29/2015   CALCIUM 9.2 01/11/2018   ANIONGAP 14 04/18/2015   Lab Results  Component Value Date   CHOL 161 08/15/2013   Lab Results  Component Value Date   HDL 70 08/15/2013   Lab Results  Component Value Date   LDLCALC 78 08/15/2013   Lab Results  Component Value Date   TRIG 64 08/15/2013   Lab Results  Component Value Date   CHOLHDL 2.3 08/15/2013   Lab Results  Component Value Date   HGBA1C 5.3 06/11/2019       Assessment & Plan:   Problem List Items Addressed This Visit   None Visit Diagnoses     Acute pain of left shoulder    -  Primary   Relevant Orders   DG Shoulder Left  Fall from stool          Acute left shoulder pain status post fall injury-we will get x-ray just to rule out fracture.  The do not feel any  palpable bony abnormalities.  I think this is mostly soft tissue related.  Recommend icing, anti-inflammatory such as Advil or Motrin.  Working on gentle range of motion and I demonstrated some exercises to do at home including range of motion shoulder rolls as well as circles leaning forward to work on range of motion without having to use resistance.  Also wrote her out of work at least until Monday if she is not feeling better at that point we will probably get her in with our sports medicine provider.  No orders of the defined types were placed in this encounter.    Beatrice Lecher, MD

## 2021-04-09 NOTE — Progress Notes (Signed)
Call patient: X-ray of the shoulder actually looks pretty good no sign of fracture dislocation or significant arthritis seen.  Soft tissues look okay as well.  That just rules out and lots of swelling.  Please see if she is feeling any better and if she is getting any improvement in her range of motion

## 2021-04-21 ENCOUNTER — Telehealth: Payer: Self-pay | Admitting: General Practice

## 2021-04-21 NOTE — Telephone Encounter (Signed)
Transition Care Management Unsuccessful Follow-up Telephone Call  Date of discharge and from where:  04/19/21 from [facility]  Attempts:  1st Attempt  Reason for unsuccessful TCM follow-up call:  Left voice message

## 2021-04-23 NOTE — Telephone Encounter (Signed)
Transition Care Management Unsuccessful Follow-up Telephone Call  Date of discharge and from where:  04/19/21 from [facility]  Attempts:  2nd Attempt  Reason for unsuccessful TCM follow-up call:  Left voice message

## 2021-04-27 NOTE — Telephone Encounter (Signed)
Transition Care Management Unsuccessful Follow-up Telephone Call  Date of discharge and from where:  04/19/21 from [facility]  Attempts:  3rd Attempt  Reason for unsuccessful TCM follow-up call:  Left voice message

## 2021-07-07 ENCOUNTER — Other Ambulatory Visit: Payer: Self-pay | Admitting: Family Medicine

## 2021-07-07 DIAGNOSIS — Z1231 Encounter for screening mammogram for malignant neoplasm of breast: Secondary | ICD-10-CM

## 2021-07-14 ENCOUNTER — Ambulatory Visit: Payer: BC Managed Care – PPO | Admitting: Family Medicine

## 2021-07-29 ENCOUNTER — Ambulatory Visit: Payer: BC Managed Care – PPO

## 2021-08-18 ENCOUNTER — Ambulatory Visit: Payer: BC Managed Care – PPO | Admitting: Family Medicine

## 2021-08-23 ENCOUNTER — Other Ambulatory Visit: Payer: Self-pay | Admitting: Nurse Practitioner

## 2021-08-23 DIAGNOSIS — G43901 Migraine, unspecified, not intractable, with status migrainosus: Secondary | ICD-10-CM

## 2021-08-27 NOTE — Telephone Encounter (Signed)
Please call patient and verify if she really needs this prescription filled she already has Zomax on file so not sure if she is actually using this 1 or not. ?

## 2021-08-30 ENCOUNTER — Other Ambulatory Visit: Payer: Self-pay | Admitting: *Deleted

## 2021-08-30 DIAGNOSIS — G43901 Migraine, unspecified, not intractable, with status migrainosus: Secondary | ICD-10-CM

## 2021-08-30 MED ORDER — ZOLMITRIPTAN 5 MG PO TABS: 5.0000 mg | ORAL_TABLET | ORAL | 3 refills | Status: DC | PRN

## 2021-08-31 NOTE — Telephone Encounter (Signed)
Left detailed message on patient's voice mail.

## 2021-09-09 ENCOUNTER — Encounter: Payer: Self-pay | Admitting: Family Medicine

## 2021-09-09 ENCOUNTER — Ambulatory Visit (INDEPENDENT_AMBULATORY_CARE_PROVIDER_SITE_OTHER): Payer: BC Managed Care – PPO | Admitting: Family Medicine

## 2021-09-09 ENCOUNTER — Ambulatory Visit (INDEPENDENT_AMBULATORY_CARE_PROVIDER_SITE_OTHER): Payer: BC Managed Care – PPO

## 2021-09-09 VITALS — BP 108/77 | HR 52 | Ht 62.0 in | Wt 203.0 lb

## 2021-09-09 DIAGNOSIS — M25561 Pain in right knee: Secondary | ICD-10-CM | POA: Diagnosis not present

## 2021-09-09 DIAGNOSIS — M7989 Other specified soft tissue disorders: Secondary | ICD-10-CM

## 2021-09-09 DIAGNOSIS — M79604 Pain in right leg: Secondary | ICD-10-CM | POA: Diagnosis not present

## 2021-09-09 DIAGNOSIS — M25512 Pain in left shoulder: Secondary | ICD-10-CM | POA: Diagnosis not present

## 2021-09-09 DIAGNOSIS — G43901 Migraine, unspecified, not intractable, with status migrainosus: Secondary | ICD-10-CM | POA: Diagnosis not present

## 2021-09-09 DIAGNOSIS — Z1231 Encounter for screening mammogram for malignant neoplasm of breast: Secondary | ICD-10-CM | POA: Diagnosis not present

## 2021-09-09 DIAGNOSIS — G8929 Other chronic pain: Secondary | ICD-10-CM

## 2021-09-09 MED ORDER — ELETRIPTAN HYDROBROMIDE 40 MG PO TABS: 40.0000 mg | ORAL_TABLET | ORAL | 3 refills | Status: AC | PRN

## 2021-09-09 MED ORDER — MELOXICAM 15 MG PO TABS: ORAL_TABLET | ORAL | 3 refills | Status: DC

## 2021-09-09 NOTE — Assessment & Plan Note (Addendum)
Zomig didn't work.  maxalt causes nausea.   Imitrexa had SE  We discussed options.  She really does not want to go on a daily medication at this point but she would like to consider either going back on the Maxalt which she does feel helped it just causes nausea or try something new.  It looks like per her list she has never tried Relpax before so we will start with that 1.  If it is not helpful then we can always go back to the Maxalt 10 mg.  We can maybe even prescribe Phenergan with it so she could take the 2 combined. See if we can try Relpax 40 mg grams

## 2021-09-09 NOTE — Patient Instructions (Signed)
Please stop up front make an appoint with Dr. Dianah Field for your left shoulder.  He can also look at your knee as well.

## 2021-09-09 NOTE — Progress Notes (Signed)
Established Patient Office Visit  Subjective   Patient ID: Candace Greene, female    DOB: 19-Nov-1970  Age: 51 y.o. MRN: [id number]  Chief Complaint  Patient presents with   Migraine   Leg Swelling    R lower leg starting behind her knee going down to her foot   Leg Pain    Pt reports that her R leg will throb   Arm Pain    L arm pain. Pt stated that it is hard for her to lift her arm    HPI  Here today to discuss her migraine headaches.  They have been flaring again.  She had had a flare last summer and we decided to restart her Topamax.  But she did not continue it.  She had frequent repetitive headaches for almost 3 weeks but feels like now it is better.  She has not currently on Topamax.  She did try the Zomig that I sent over to the pharmacy but says it really was not helpful.  He would rather go back to her old triptan which she feels was more helpful.  She felt the Zomig really did not work very well at all.  She was still having to take over-the-counter's with it to get any relief.  Still having pain in her left shoulder after a fall. I saw her in Dec for this issues.  She still having significant pain especially if she lifts that shoulder to 90 degrees.  It is hard to lift past that but she is able to do it.  She has been taking muscle relaxer here and there but really just because of sedation and does not really help her pain.  She is not currently taking an anti-inflammatory.  Also having pain behind her right knee.  It is worse with more activity sometimes she has to prop it up at night.  Sometimes it just feels tight she is also been having a little medial knee pain just below the joint line on that right knee as well.  And sometimes she will get some swelling down in that right foot which is intermittent.  She says she tried to wear high heels the other day and it started to swell so bad she had to take her shoe off.    ROS    Objective:     BP 108/77   Pulse (!) 52    Ht '5\' 2"'$  (1.575 m)   Wt 203 lb (92.1 kg)   SpO2 100%   BMI 37.13 kg/m    Physical Exam Vitals reviewed.  Constitutional:      Appearance: She is well-developed.  HENT:     Head: Normocephalic and atraumatic.  Eyes:     Conjunctiva/sclera: Conjunctivae normal.  Cardiovascular:     Rate and Rhythm: Normal rate.  Pulmonary:     Effort: Pulmonary effort is normal.  Skin:    General: Skin is dry.     Coloration: Skin is not pale.  Neurological:     Mental Status: She is alert and oriented to person, place, and time.  Psychiatric:        Behavior: Behavior normal.     No results found for any visits on 09/09/21.    The ASCVD Risk score (Arnett DK, et al., 2019) failed to calculate for the following reasons:   Cannot find a previous HDL lab   Cannot find a previous total cholesterol lab    Assessment & Plan:   Problem List  Items Addressed This Visit       Cardiovascular and Mediastinum   Migraine with status migrainosus, not intractable - Primary    Zomig didn't work.  maxalt causes nausea.   Imitrexa had SE  We discussed options.  She really does not want to go on a daily medication at this point but she would like to consider either going back on the Maxalt which she does feel helped it just causes nausea or try something new.  It looks like per her list she has never tried Relpax before so we will start with that 1.  If it is not helpful then we can always go back to the Maxalt 10 mg.  We can maybe even prescribe Phenergan with it so she could take the 2 combined. See if we can try Relpax 40 mg grams       Relevant Medications   eletriptan (RELPAX) 40 MG tablet   meloxicam (MOBIC) 15 MG tablet     Other   Right leg pain   Relevant Medications   meloxicam (MOBIC) 15 MG tablet   Other Relevant Orders   [place] RT LOWER EXTREM LTD SOFT TISSUE NON VASCULAR   Other Visit Diagnoses     Chronic left shoulder pain       Relevant Medications   meloxicam (MOBIC) 15  MG tablet   Acute pain of right knee       Right leg swelling           Right leg pain-concerning for possible Baker's cyst behind the knee.  She had plain film x-rays done about 5 years ago and did not show any significant arthritis no recent trauma or injury most of her pain again is behind the knee and she is getting getting some intermittent swelling in that right lower leg and foot from time to time.  Left shoulder pain-she is able to raise that arm above 90 degrees but starts having significant pain and tenderness at 90 degrees.  We will have her schedule an appointment with our sports med doc for further work-up.  We did do an x-ray initially back in December after the fall and there was no fracture.  But I suspect she probably either has some bursitis or maybe even a rotator cuff injury.  No follow-ups on file.    Beatrice Lecher, MD

## 2021-09-10 ENCOUNTER — Ambulatory Visit (INDEPENDENT_AMBULATORY_CARE_PROVIDER_SITE_OTHER): Payer: BC Managed Care – PPO

## 2021-09-10 ENCOUNTER — Ambulatory Visit (INDEPENDENT_AMBULATORY_CARE_PROVIDER_SITE_OTHER): Payer: BC Managed Care – PPO | Admitting: Sports Medicine

## 2021-09-10 DIAGNOSIS — M7542 Impingement syndrome of left shoulder: Secondary | ICD-10-CM

## 2021-09-10 DIAGNOSIS — M79604 Pain in right leg: Secondary | ICD-10-CM

## 2021-09-10 DIAGNOSIS — M1711 Unilateral primary osteoarthritis, right knee: Secondary | ICD-10-CM | POA: Diagnosis not present

## 2021-09-10 DIAGNOSIS — M5416 Radiculopathy, lumbar region: Secondary | ICD-10-CM | POA: Diagnosis not present

## 2021-09-10 DIAGNOSIS — Z09 Encounter for follow-up examination after completed treatment for conditions other than malignant neoplasm: Secondary | ICD-10-CM

## 2021-09-10 DIAGNOSIS — M545 Low back pain, unspecified: Secondary | ICD-10-CM

## 2021-09-10 MED ORDER — MELOXICAM 15 MG PO TABS: ORAL_TABLET | ORAL | 3 refills | Status: DC

## 2021-09-10 MED ORDER — PREDNISONE 50 MG PO TABS: ORAL_TABLET | ORAL | 0 refills | Status: DC

## 2021-09-10 NOTE — Progress Notes (Signed)
    Procedures performed today:    None.  Independent interpretation of notes and tests performed by another provider:   None.  Brief History, Exam, Impression, and Recommendations:    Right lumbar radiculopathy Over a month of axial discogenic back pain radiating down the right leg to the bottom of the right foot consistent with an L5 distribution radiculitis, adding x-rays, home physical therapy, 5 days of prednisone. Return to see me in 6 weeks, MRI for interventional planning if not better.  Impingement syndrome, shoulder, left Positive impingement signs on exam. X-rays unrevealing, Mobic, home conditioning, return in 6 weeks, injection if not better.  Primary osteoarthritis of right knee Joint line pain, minimal swelling. DVT ultrasound negative. Adding x-rays, Mobic as above, home conditioning, return to see me in 4 to 6 weeks, injection if not better.  Chronic process with exacerbation and pharmacologic intervention  ___________________________________________ Gwen Her. Dianah Field, M.D., ABFM., CAQSM. Primary Care and [place] Instructor of [employer] [facility] [employer]

## 2021-09-10 NOTE — Assessment & Plan Note (Signed)
Over a month of axial discogenic back pain radiating down the right leg to the bottom of the right foot consistent with an L5 distribution radiculitis, adding x-rays, home physical therapy, 5 days of prednisone. Return to see me in 6 weeks, MRI for interventional planning if not better.

## 2021-09-10 NOTE — Progress Notes (Signed)
Call pt: no sign of abnormality on the [place].  Thus I really like to get you in with Dr. Darene Lamer for your knee I still feel a lot of your pain is originating from your knee.

## 2021-09-10 NOTE — Assessment & Plan Note (Signed)
Joint line pain, minimal swelling. DVT ultrasound negative. Adding x-rays, Mobic as above, home conditioning, return to see me in 4 to 6 weeks, injection if not better.

## 2021-09-10 NOTE — Assessment & Plan Note (Signed)
Positive impingement signs on exam. X-rays unrevealing, Mobic, home conditioning, return in 6 weeks, injection if not better.

## 2021-09-10 NOTE — Progress Notes (Signed)
Please call patient. Normal mammogram.  Repeat in 1 year.  

## 2021-11-15 ENCOUNTER — Telehealth: Payer: Self-pay

## 2021-11-15 NOTE — Telephone Encounter (Signed)
Patient called on 11/14/21 with symptoms of a bad headache. Per on-call nurse patient took their migraine rx. On-call nurse did recommend for patient to g to the ER. Attempted to contact patient on 11/15/21. No answer, left a detailed reason of the call. Patient advised to return a call back to the clinic. Direct call back info provided.

## 2021-11-16 ENCOUNTER — Telehealth: Payer: Self-pay | Admitting: General Practice

## 2021-11-16 NOTE — Telephone Encounter (Signed)
Transition Care Management Unsuccessful Follow-up Telephone Call  Date of discharge and from where:  11/14/21 from [facility]  Attempts:  1st Attempt  Reason for unsuccessful TCM follow-up call:  Left voice message

## 2021-11-17 NOTE — Telephone Encounter (Signed)
Transition Care Management Unsuccessful Follow-up Telephone Call  Date of discharge and from where:  11/14/21 from [facility]  Attempts:  2nd Attempt  Reason for unsuccessful TCM follow-up call:  Left voice message

## 2021-11-23 NOTE — Telephone Encounter (Signed)
Transition Care Management Unsuccessful Follow-up Telephone Call  Date of discharge and from where:  11/14/21 from [facility]  Attempts:  3rd Attempt  Reason for unsuccessful TCM follow-up call:  Left voice message

## 2021-11-29 NOTE — Telephone Encounter (Signed)
Patient went to the ER on 11/14/21 for her symptoms. Patient informed to follow up with provider.

## 2022-01-25 ENCOUNTER — Ambulatory Visit (INDEPENDENT_AMBULATORY_CARE_PROVIDER_SITE_OTHER): Payer: BC Managed Care – PPO | Admitting: Physician Assistant

## 2022-01-25 VITALS — BP 120/92 | HR 60 | Temp 98.3°F | Ht 62.0 in | Wt 197.0 lb

## 2022-01-25 DIAGNOSIS — Z23 Encounter for immunization: Secondary | ICD-10-CM | POA: Diagnosis not present

## 2022-01-25 DIAGNOSIS — M5136 Other intervertebral disc degeneration, lumbar region: Secondary | ICD-10-CM | POA: Diagnosis not present

## 2022-01-25 DIAGNOSIS — R300 Dysuria: Secondary | ICD-10-CM | POA: Diagnosis not present

## 2022-01-25 DIAGNOSIS — N898 Other specified noninflammatory disorders of vagina: Secondary | ICD-10-CM

## 2022-01-25 DIAGNOSIS — M545 Low back pain, unspecified: Secondary | ICD-10-CM

## 2022-01-25 LAB — POCT URINALYSIS DIP (CLINITEK)
Bilirubin, UA: NEGATIVE
Blood, UA: NEGATIVE
Glucose, UA: NEGATIVE mg/dL
Ketones, POC UA: NEGATIVE mg/dL
Leukocytes, UA: NEGATIVE
Nitrite, UA: NEGATIVE
POC PROTEIN,UA: NEGATIVE
Spec Grav, UA: 1.02 (ref 1.010–1.025)
Urobilinogen, UA: 1 E.U./dL
pH, UA: 7 (ref 5.0–8.0)

## 2022-01-25 MED ORDER — FLUCONAZOLE 150 MG PO TABS: 150.0000 mg | ORAL_TABLET | Freq: Once | ORAL | 0 refills | Status: AC

## 2022-01-25 MED ORDER — CYCLOBENZAPRINE HCL 10 MG PO TABS: 10.0000 mg | ORAL_TABLET | Freq: Three times a day (TID) | ORAL | 2 refills | Status: AC | PRN

## 2022-01-25 MED ORDER — KETOROLAC TROMETHAMINE 60 MG/2ML IM SOLN
60.0000 mg | Freq: Once | INTRAMUSCULAR | Status: AC
  Administered 2022-01-25: 60 mg via INTRAMUSCULAR

## 2022-01-25 MED ORDER — PREDNISONE 50 MG PO TABS: ORAL_TABLET | ORAL | 0 refills | Status: DC

## 2022-01-25 NOTE — Patient Instructions (Addendum)
Tens unit and heating pad Prednisone burst Muscle relaxer as needed   Degenerative Disk Disease  Degenerative disk disease is a condition caused by changes that occur in the spinal disks as a person ages. Spinal disks are soft and compressible disks located between the bones of your spine (vertebrae). These disks act like shock absorbers. Degenerative disk disease can affect the whole spine. However, the neck and lower back are most often affected. Many changes can occur in the spinal disks with aging, such as: The spinal disks may dry and shrink. Small tears may occur in the tough, outer covering of the disk (annulus). The disk space may become smaller due to loss of water. Abnormal growths in the bone (spurs) may occur. This can put pressure on the nerve roots exiting the spinal canal, causing pain. The spinal canal may become narrowed. What are the causes? This condition may be caused by: Normal degeneration with age. Injuries. Certain activities and sports that cause damage. What increases the risk? The following factors may make you more likely to develop this condition: Being overweight. Having a family history of degenerative disk disease. Smoking and use of products that contain nicotine and tobacco. Sudden injury. Doing work that requires heavy lifting. What are the signs or symptoms? Symptoms of this condition include: Pain that varies in intensity. Some people have no pain, while others have severe pain. The location of the pain depends on the part of your backbone that is affected. You may have: Pain in your neck or arm if a disk in your neck area is affected. Pain in your back, buttocks, or legs if a disk in your lower back is affected. Pain that becomes worse while bending or reaching up, or with twisting movements. Pain that may start gradually and worsen as time passes. It may also start after a major or minor injury. Numbness or tingling in the arms or legs. How is  this diagnosed? This condition may be diagnosed based on: Your symptoms and medical history. A physical exam. Imaging tests, including: X-ray of the spine. CT scan. MRI. How is this treated? This condition may be treated with: Medicines. Injection of steroids into the back. Rehabilitation exercises. These activities aim to strengthen muscles in your back and abdomen to better support your spine. If treatments do not help to relieve your symptoms or you have severe pain, you may need surgery. Follow these instructions at home: Medicines Take over-the-counter and prescription medicines only as told by your health care provider. Ask your health care provider if the medicine prescribed to you: Requires you to avoid driving or using machinery. Can cause constipation. You may need to take these actions to prevent or treat constipation: Drink enough fluid to keep your urine pale yellow. Take over-the-counter or prescription medicines. Eat foods that are high in fiber, such as beans, whole grains, and fresh fruits and vegetables. Limit foods that are high in fat and processed sugars, such as fried or sweet foods. Activity Rest as told by your health care provider. Avoid sitting for a long time without moving. Get up to take short walks every 1-2 hours. This is important to improve blood flow and breathing. Ask for help if you feel weak or unsteady. Return to your normal activities as told by your health care provider. Ask your health care provider what activities are safe for you. Perform relaxation exercises as told by your health care provider. Maintain good posture. Do not lift anything that is heavier than 10  lb (4.5 kg), or the limit that you are told, until your health care provider says that it is safe. Follow proper lifting and walking techniques as told by your health care provider. Managing pain, stiffness, and swelling     If directed, put ice on the painful area. Icing can  help to relieve pain. To do this: Put ice in a plastic bag. Place a towel between your skin and the bag. Leave the ice on for 20 minutes, 2-3 times a day. Remove the ice if your skin turns bright red. This is very important. If you cannot feel pain, heat, or cold, you have a greater risk of damage to the area. If directed, apply heat to the painful area as often as told by your health care provider. Heat can reduce the stiffness of your muscles. Use the heat source that your health care provider recommends, such as a moist heat pack or a heating pad. Place a towel between your skin and the heat source. Leave the heat on for 20-30 minutes. Remove the heat if your skin turns bright red. This is especially important if you are unable to feel pain, heat, or cold. You may have a greater risk of getting burned. General instructions Change your sitting, standing, and sleeping habits as told by your health care provider. Avoid sitting in the same position for long periods of time. Change positions frequently. Lose weight or maintain a healthy weight as told by your health care provider. Do not use any products that contain nicotine or tobacco, such as cigarettes, e-cigarettes, and chewing tobacco. If you need help quitting, ask your health care provider. Wear supportive footwear. Keep all follow-up visits. This is important. This may include visits for physical therapy. Contact a health care provider if you: Have pain that does not go away within 1-4 weeks. Lose your appetite. Lose weight without trying. Get help right away if you: Have severe pain. Notice weakness in your arms, hands, or legs. Begin to lose control of your bladder or bowel movements. Have fevers or night sweats. Summary Degenerative disk disease is a condition caused by changes that occur in the spinal disks as a person ages. This condition can affect the whole spine. However, the neck and lower back are most often  affected. Take over-the-counter and prescription medicines only as told by your health care provider. This information is not intended to replace advice given to you by your health care provider. Make sure you discuss any questions you have with your health care provider. Document Revised: 07/25/2019 Document Reviewed: 07/25/2019 Elsevier Patient Education  [place].

## 2022-01-25 NOTE — Progress Notes (Unsigned)
Acute Office Visit  Subjective:     Patient ID: Candace Greene, female    DOB: [birth year], 51 y.o.   MRN: [id number]  Chief Complaint  Patient presents with   Back Pain    HPI Patient is in today for low back pain that is off and on but worse for the last few days, dysuria and vaginal itching.   She has not been sexually active in a few months but recently did have sex. She feels like most of itching is external. No vaginal discharge or odor. No fever, chills, body aches, nausea, flank pain. She has not tried anything to make better. She can only urinate 2 times in 8 hour period at work.   Pt has lumbar DDD. She denies any radiation of pain down legs or bladder dysfunction. NSAIDs help some. No recent flare. No known injury. She does have a very physical job.   .. Active Ambulatory Problems    Diagnosis Date Noted   Wrist tendonitis 11/28/2014   History of pulmonary embolism 04/17/2015   Abnormal echocardiogram 05/06/2015   Elevated lipase 06/15/2015   Ovarian cyst 06/16/2015   Uterine fibroid 06/16/2015   Pica 09/01/2015   DUB (dysfunctional uterine bleeding) 09/01/2015   Iron deficiency anemia 09/01/2015   Menorrhagia with irregular cycle 09/01/2015   Fatty liver 11/04/2015   Right leg pain 06/27/2016   Carpal tunnel syndrome 05/11/2017   Cubital tunnel syndrome 05/11/2017   Ganglion cyst of dorsum of right wrist 04/01/2019   Intertrigo 04/01/2019   Nontraumatic rupture of extensor tendons of hand and wrist, right 04/01/2019   Slow transit constipation 06/21/2019   Headache disorder 10/07/2019   Migraine with status migrainosus, not intractable 06/08/2020   Right lumbar radiculopathy 09/10/2021   Impingement syndrome, shoulder, left 09/10/2021   Primary osteoarthritis of right knee 09/10/2021   DDD (degenerative disc disease), lumbar 01/25/2022   Acute bilateral low back pain without sciatica 01/25/2022   Resolved Ambulatory Problems    Diagnosis Date Noted    KNEE PAIN 03/16/2010   TOE PAIN 03/16/2010   Irregular bleeding 11/28/2014   Acute pulmonary embolism ([place]) 04/17/2015   Chest pain 05/06/2015   Atypical chest pain 06/10/2015   Epigastric abdominal tenderness on direct palpation 06/10/2015   Abdominal guarding 06/15/2015   Generalized abdominal pain 06/15/2015   Fibroid, uterine 09/01/2015   Right foot pain 08/09/2016   Positive Murphy's Sign 05/08/2017   Pneumonia of left lower lobe due to infectious organism 09/12/2017   Paronychia of great toe, right 05/28/2019   Past Medical History:  Diagnosis Date   Heel spur    Pulmonary embolus ([place])      ROS See HPI.      Objective:    BP (!) 120/92   Pulse 60   Temp 98.3 F (36.8 C) (Oral)   Ht '5\' 2"'$  (1.575 m)   Wt 197 lb (89.4 kg)   SpO2 98%   BMI 36.03 kg/m  BP Readings from Last 3 Encounters:  01/25/22 (!) 120/92  09/09/21 108/77  04/07/21 124/65   Wt Readings from Last 3 Encounters:  01/25/22 197 lb (89.4 kg)  09/09/21 203 lb (92.1 kg)  04/07/21 198 lb (89.8 kg)    .Marland Kitchen Results for orders placed or performed in visit on 01/25/22  WET PREP FOR [place], YEAST, CLUE   Specimen: GYN  Result Value Ref Range   MICRO NUMBER: [id number]    Specimen Quality Adequate    SOURCE: NOT GIVEN  Status FINAL    RESULT      No Trichomonas vaginalis seen. No yeast seen No clue cells seen Epithelial Cells Present  POCT URINALYSIS DIP (CLINITEK)  Result Value Ref Range   Color, UA yellow yellow   Clarity, UA clear clear   Glucose, UA negative negative mg/dL   Bilirubin, UA negative negative   Ketones, POC UA negative negative mg/dL   Spec Grav, UA 1.020 1.010 - 1.025   Blood, UA negative negative   pH, UA 7.0 5.0 - 8.0   POC PROTEIN,UA negative negative, trace   Urobilinogen, UA 1.0 0.2 or 1.0 E.U./dL   Nitrite, UA Negative Negative   Leukocytes, UA Negative Negative     Physical Exam Constitutional:      Appearance: Normal appearance. She is obese.  HENT:      Head: Normocephalic.  Cardiovascular:     Rate and Rhythm: Normal rate and regular rhythm.  Pulmonary:     Breath sounds: Normal breath sounds.  Abdominal:     General: There is no distension.     Palpations: Abdomen is soft. There is no mass.     Tenderness: There is no abdominal tenderness. There is no right CVA tenderness, left CVA tenderness, guarding or rebound.     Hernia: No hernia is present.  Genitourinary:    Comments: Hyperpigmentation external labia folds with glossy white appearance Musculoskeletal:     Right lower leg: No edema.     Left lower leg: No edema.     Comments: NROM at waist 2+ symmetric pataller pulses 5/5 bilateral extremity strength Negative SLR, bilaterally  Neurological:     General: No focal deficit present.     Mental Status: She is alert and oriented to person, place, and time.  Psychiatric:        Mood and Affect: Mood normal.          Assessment & Plan:  .Marland KitchenJosette was seen today for back pain.  Diagnoses and all orders for this visit:  Acute bilateral low back pain without sciatica -     POCT URINALYSIS DIP (CLINITEK) -     predniSONE (DELTASONE) 50 MG tablet; One tab PO daily for 5 days. -     cyclobenzaprine (FLEXERIL) 10 MG tablet; Take 1 tablet (10 mg total) by mouth 3 (three) times daily as needed for muscle spasms. -     ketorolac (TORADOL) injection 60 mg  Dysuria -     POCT URINALYSIS DIP (CLINITEK) -     Urine Culture -     WET PREP FOR TRICH, YEAST, CLUE  Vaginal itching -     fluconazole (DIFLUCAN) 150 MG tablet; Take 1 tablet (150 mg total) by mouth once for 1 dose. Repeat in 72 hours if symptoms persist. -     WET PREP FOR TRICH, YEAST, CLUE  DDD (degenerative disc disease), lumbar -     predniSONE (DELTASONE) 50 MG tablet; One tab PO daily for 5 days. -     cyclobenzaprine (FLEXERIL) 10 MG tablet; Take 1 tablet (10 mg total) by mouth 3 (three) times daily as needed for muscle spasms. -     ketorolac (TORADOL)  injection 60 mg  Needs flu shot -     Flu Vaccine QUAD 31moIM (Fluarix, Fluzone & Alfiuria Quad PF)   UA negative for blood, leuks, protein, nitrites. Will culture Wet prep ordered Concern for external yeast Diflucan sent for itching Encouraged epson salt water soaks in tub  I  suspect low back pain is coming from lumbar DDD Discussed conservative management with NSAIDs, tens unit, stretches, good lifting techniques, icy hot patches No red flag on todays exam Flexeril to use as needed Prednisone burst given Toradol '60mg'$  IM  Note for work to urinate at least 4 times in an 8 hour period.   Iran Planas, PA-C

## 2022-01-26 ENCOUNTER — Encounter: Payer: Self-pay | Admitting: Physician Assistant

## 2022-01-26 LAB — WET PREP FOR TRICH, YEAST, CLUE
MICRO NUMBER:: 14000674
Specimen Quality: ADEQUATE

## 2022-01-26 NOTE — Progress Notes (Signed)
No trich/no yeast/no BV. If itching after diflucan let me know.

## 2022-01-27 LAB — URINE CULTURE
MICRO NUMBER:: 14005173
Result:: NO GROWTH
SPECIMEN QUALITY:: ADEQUATE

## 2022-01-27 NOTE — Progress Notes (Signed)
No growth on urine culture. No urinary tract infection.

## 2022-02-11 ENCOUNTER — Ambulatory Visit (INDEPENDENT_AMBULATORY_CARE_PROVIDER_SITE_OTHER): Payer: BC Managed Care – PPO

## 2022-02-11 ENCOUNTER — Ambulatory Visit (INDEPENDENT_AMBULATORY_CARE_PROVIDER_SITE_OTHER): Payer: BC Managed Care – PPO | Admitting: Family Medicine

## 2022-02-11 ENCOUNTER — Encounter: Payer: Self-pay | Admitting: Family Medicine

## 2022-02-11 VITALS — BP 132/88 | HR 57 | Ht 62.0 in | Wt 198.0 lb

## 2022-02-11 DIAGNOSIS — M25571 Pain in right ankle and joints of right foot: Secondary | ICD-10-CM | POA: Diagnosis not present

## 2022-02-11 DIAGNOSIS — S93401A Sprain of unspecified ligament of right ankle, initial encounter: Secondary | ICD-10-CM | POA: Diagnosis not present

## 2022-02-11 NOTE — Progress Notes (Signed)
   Acute Office Visit  Subjective:     Patient ID: Candace Greene, female    DOB: 14-Apr-1971, 51 y.o.   MRN: [id number]  Chief Complaint  Patient presents with   Ankle Pain    HPI Patient is in today for Right ankle pain secondary to work injury on Monday.  She was at work and slipped on some grease in her right foot went behind her and she actually ended up landing on the foot itself with it inverted.  Using Ice and soaking in epsom salt.  She says its not too painful to walk its more when she has been standing for an extended period of time that is when it becomes really sore and she just has to get pressure off of it.  Most of her pain is lateral most of her swelling and bruising is lateral.   ROS      Objective:    BP 132/88   Pulse (!) 57   Ht '5\' 2"'$  (1.575 m)   Wt 198 lb (89.8 kg)   SpO2 100%   BMI 36.21 kg/m    Physical Exam Vitals reviewed.  Constitutional:      Appearance: She is well-developed.  HENT:     Head: Normocephalic and atraumatic.  Eyes:     Conjunctiva/sclera: Conjunctivae normal.  Cardiovascular:     Rate and Rhythm: Normal rate.  Pulmonary:     Effort: Pulmonary effort is normal.  Musculoskeletal:     Comments: Ankle with normal range of motion.  Nontender over the fifth metatarsal she is tender a little inferior and medial to the lateral malleolus.  She has significant swelling and bruising laterally.  Strength is 5 out of 5 in all directions.  Skin:    General: Skin is dry.     Coloration: Skin is not pale.  Neurological:     Mental Status: She is alert and oriented to person, place, and time.  Psychiatric:        Behavior: Behavior normal.     No results found for any visits on 02/11/22.      Assessment & Plan:   Problem List Items Addressed This Visit   None Visit Diagnoses     Acute right ankle pain    -  Primary   Relevant Orders   DG Ankle Complete Right   Sprain of right ankle, unspecified ligament, initial encounter           Right ankle sprain -  Pain is mostly lateral tender around the malleolus medially.  I am less suspicious of a fracture but with the injury occurring at work I would rather err on the side of ruling out a fracture.  I strongly suspect that she has a strain.  Ace wrap applied today recommend rest, ice, elevation and using Ace wrap over the weekend.  I am can go ahead and write her out of work for the next 5 days so she can try to get off that foot a little bit she works 2 jobs currently.  If she is not improving at that point then we can get her in with sports medicine.  When she is starting to feel better with the ankle and to have her do some rehab exercises.  Provided handout today.   No orders of the defined types were placed in this encounter.   No follow-ups on file.  Beatrice Lecher, MD

## 2022-02-15 NOTE — Progress Notes (Signed)
No sign of fracture. Hopefully your ankle is starting to feel better. If not better into next week then we can schedule with sports med

## 2022-02-16 ENCOUNTER — Ambulatory Visit (INDEPENDENT_AMBULATORY_CARE_PROVIDER_SITE_OTHER): Payer: BC Managed Care – PPO | Admitting: Sports Medicine

## 2022-02-16 ENCOUNTER — Encounter: Payer: Self-pay | Admitting: Sports Medicine

## 2022-02-16 DIAGNOSIS — S93401D Sprain of unspecified ligament of right ankle, subsequent encounter: Secondary | ICD-10-CM

## 2022-02-16 MED ORDER — MELOXICAM 15 MG PO TABS: ORAL_TABLET | ORAL | 3 refills | Status: DC

## 2022-02-16 NOTE — Assessment & Plan Note (Signed)
This pleasant 51 year old female is approximately 9 days post inversion injury right ankle, she had some swelling, saw the PCP, got some x-rays which were negative. Continues to have discomfort, pain is predominantly over the ATFL, ankle is stable with good motion and good strength. I would like her in an ASO, adding home conditioning, meloxicam. Return to see me if not better in 2 to 3 weeks.

## 2022-02-16 NOTE — Progress Notes (Signed)
    Procedures performed today:    None.  Independent interpretation of notes and tests performed by another provider:   None.  Brief History, Exam, Impression, and Recommendations:    Moderate ankle sprain, right, subsequent encounter This pleasant 51 year old female is approximately 9 days post inversion injury right ankle, she had some swelling, saw the PCP, got some x-rays which were negative. Continues to have discomfort, pain is predominantly over the ATFL, ankle is stable with good motion and good strength. I would like her in an ASO, adding home conditioning, meloxicam. Return to see me if not better in 2 to 3 weeks.    ____________________________________________ Gwen Her. Dianah Field, M.D., ABFM., CAQSM., AME. Primary Care and Sports Medicine [facility]  Adjunct Professor of [place] [employer] [facility] of Medicine  Risk manager

## 2022-03-08 ENCOUNTER — Telehealth: Payer: Self-pay | Admitting: Family Medicine

## 2022-03-08 NOTE — Telephone Encounter (Signed)
Pt called. She wants to know if FMLA paperwork was faxed to [facility].

## 2022-03-09 ENCOUNTER — Ambulatory Visit: Payer: BC Managed Care – PPO | Admitting: Sports Medicine

## 2022-03-09 NOTE — Telephone Encounter (Signed)
Forms faxed,confirmation received and scanned into chart.

## 2022-03-09 NOTE — Telephone Encounter (Signed)
Forms completed and placed in [place]

## 2022-03-25 ENCOUNTER — Ambulatory Visit (INDEPENDENT_AMBULATORY_CARE_PROVIDER_SITE_OTHER): Payer: BC Managed Care – PPO | Admitting: Sports Medicine

## 2022-03-25 DIAGNOSIS — S93401D Sprain of unspecified ligament of right ankle, subsequent encounter: Secondary | ICD-10-CM

## 2022-03-25 NOTE — Assessment & Plan Note (Signed)
This is a very pleasant 51 year old female, she is here for follow-up and now approximately 7 weeks post inversion injury of the right ankle, she had some swelling, negative x-rays, she still has a bit of swelling but pain has improved considerably, she has good motion, good strength, return as needed.

## 2022-03-25 NOTE — Progress Notes (Signed)
    Procedures performed today:    None.  Independent interpretation of notes and tests performed by another provider:   None.  Brief History, Exam, Impression, and Recommendations:    Moderate ankle sprain, right, subsequent encounter This is a very pleasant 51 year old female, she is here for follow-up and now approximately 7 weeks post inversion injury of the right ankle, she had some swelling, negative x-rays, she still has a bit of swelling but pain has improved considerably, she has good motion, good strength, return as needed.    ____________________________________________ Gwen Her. Dianah Field, M.D., ABFM., CAQSM., AME. Primary Care and Sports Medicine [facility]  Adjunct Professor of [place] [employer] [facility] of Medicine  Risk manager

## 2022-04-13 ENCOUNTER — Encounter: Payer: Self-pay | Admitting: Family Medicine

## 2022-04-13 ENCOUNTER — Ambulatory Visit (INDEPENDENT_AMBULATORY_CARE_PROVIDER_SITE_OTHER): Payer: BC Managed Care – PPO

## 2022-04-13 ENCOUNTER — Ambulatory Visit (INDEPENDENT_AMBULATORY_CARE_PROVIDER_SITE_OTHER): Payer: BC Managed Care – PPO | Admitting: Family Medicine

## 2022-04-13 VITALS — BP 120/67 | HR 65 | Ht 62.0 in | Wt 200.0 lb

## 2022-04-13 DIAGNOSIS — N920 Excessive and frequent menstruation with regular cycle: Secondary | ICD-10-CM

## 2022-04-13 DIAGNOSIS — M25531 Pain in right wrist: Secondary | ICD-10-CM

## 2022-04-13 DIAGNOSIS — M549 Dorsalgia, unspecified: Secondary | ICD-10-CM

## 2022-04-13 DIAGNOSIS — R21 Rash and other nonspecific skin eruption: Secondary | ICD-10-CM

## 2022-04-13 MED ORDER — NYSTATIN 100000 UNIT/GM EX CREA: 1.0000 | TOPICAL_CREAM | Freq: Two times a day (BID) | CUTANEOUS | 2 refills | Status: DC

## 2022-04-13 MED ORDER — NYSTATIN 100000 UNIT/GM EX POWD: 1.0000 | Freq: Three times a day (TID) | CUTANEOUS | 2 refills | Status: DC
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
## 2022-04-14 NOTE — Progress Notes (Signed)
:   X-ray is negative for any type of fracture as per the radiologist.  Continue with elevation, Ace wrap, ice and anti-inflammatory as needed.

## 2022-04-21 ENCOUNTER — Other Ambulatory Visit: Payer: BC Managed Care – PPO

## 2022-04-29 ENCOUNTER — Ambulatory Visit (INDEPENDENT_AMBULATORY_CARE_PROVIDER_SITE_OTHER): Payer: BC Managed Care – PPO

## 2022-04-29 DIAGNOSIS — N920 Excessive and frequent menstruation with regular cycle: Secondary | ICD-10-CM

## 2022-05-02 NOTE — Progress Notes (Signed)
Call patient she does have several small uterine fibroids.  The lining of the uterus looks a little thicker than it should be.  I would like to get you in with OB/GYN.  It is not urgent but I would like to address the heavy frequent bleeding and come up with a game plan for you.  If you have a preference for provider or location then please let me know.

## 2022-05-03 ENCOUNTER — Other Ambulatory Visit: Payer: Self-pay | Admitting: *Deleted

## 2022-05-03 DIAGNOSIS — D250 Submucous leiomyoma of uterus: Secondary | ICD-10-CM

## 2022-05-03 DIAGNOSIS — N920 Excessive and frequent menstruation with regular cycle: Secondary | ICD-10-CM

## 2022-05-23 ENCOUNTER — Telehealth: Payer: Self-pay | Admitting: General Practice

## 2022-05-23 NOTE — Telephone Encounter (Signed)
Transition Care Management Unsuccessful Follow-up Telephone Call  Date of discharge and from where:  05/22/22 from [facility]  Attempts:  1st Attempt  Reason for unsuccessful TCM follow-up call:  Left voice message

## 2022-05-26 NOTE — Telephone Encounter (Signed)
Transition Care Management Unsuccessful Follow-up Telephone Call  Date of discharge and from where:  05/22/22 from [facility]  Attempts:  2nd Attempt  Reason for unsuccessful TCM follow-up call:  Left voice message

## 2022-05-27 NOTE — Telephone Encounter (Signed)
Transition Care Management Unsuccessful Follow-up Telephone Call  Date of discharge and from where:  05/22/22 from [facility]  Attempts:  3rd Attempt  Reason for unsuccessful TCM follow-up call:  Left voice message

## 2022-07-12 ENCOUNTER — Ambulatory Visit (INDEPENDENT_AMBULATORY_CARE_PROVIDER_SITE_OTHER): Payer: 59

## 2022-07-12 ENCOUNTER — Encounter: Payer: Self-pay | Admitting: Family Medicine

## 2022-07-12 ENCOUNTER — Ambulatory Visit (INDEPENDENT_AMBULATORY_CARE_PROVIDER_SITE_OTHER): Payer: 59 | Admitting: Family Medicine

## 2022-07-12 VITALS — BP 127/77 | HR 57 | Ht 62.0 in | Wt 200.0 lb

## 2022-07-12 DIAGNOSIS — W19XXXA Unspecified fall, initial encounter: Secondary | ICD-10-CM | POA: Diagnosis not present

## 2022-07-12 DIAGNOSIS — G8929 Other chronic pain: Secondary | ICD-10-CM | POA: Diagnosis not present

## 2022-07-12 DIAGNOSIS — W1830XA Fall on same level, unspecified, initial encounter: Secondary | ICD-10-CM

## 2022-07-12 DIAGNOSIS — M25571 Pain in right ankle and joints of right foot: Secondary | ICD-10-CM | POA: Diagnosis not present

## 2022-07-12 DIAGNOSIS — M25561 Pain in right knee: Secondary | ICD-10-CM | POA: Diagnosis not present

## 2022-07-12 DIAGNOSIS — M25531 Pain in right wrist: Secondary | ICD-10-CM | POA: Diagnosis not present
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
## 2022-07-13 NOTE — Progress Notes (Signed)
Call patient: X-ray just shows some mild arthritis particularly in the middle part of the knee.  No fracture etc.  Would recommend physical therapy especially if she is not improving towards the end of the week.

## 2022-07-14 ENCOUNTER — Telehealth: Payer: Self-pay | Admitting: Family Medicine

## 2022-07-14 NOTE — Telephone Encounter (Signed)
Forms placed up front for pt to be called for payment and to have forms faxed.

## 2022-07-14 NOTE — Telephone Encounter (Signed)
Forms completed for Ward Chatters. Placed in [employer] .

## 2022-07-18 ENCOUNTER — Ambulatory Visit: Payer: BC Managed Care – PPO | Admitting: Family Medicine

## 2022-08-07 ENCOUNTER — Ambulatory Visit (INDEPENDENT_AMBULATORY_CARE_PROVIDER_SITE_OTHER): Payer: 59

## 2022-08-07 DIAGNOSIS — M25571 Pain in right ankle and joints of right foot: Secondary | ICD-10-CM

## 2022-08-07 DIAGNOSIS — M25531 Pain in right wrist: Secondary | ICD-10-CM

## 2022-08-07 DIAGNOSIS — G8929 Other chronic pain: Secondary | ICD-10-CM | POA: Diagnosis not present

## 2022-08-10 ENCOUNTER — Other Ambulatory Visit: Payer: Self-pay | Admitting: *Deleted

## 2022-08-10 ENCOUNTER — Encounter: Payer: BC Managed Care – PPO | Admitting: Obstetrics and Gynecology

## 2022-08-10 ENCOUNTER — Encounter: Payer: Self-pay | Admitting: Family Medicine

## 2022-08-10 DIAGNOSIS — M87039 Idiopathic aseptic necrosis of unspecified carpus: Secondary | ICD-10-CM | POA: Insufficient documentation

## 2022-08-10 DIAGNOSIS — M778 Other enthesopathies, not elsewhere classified: Secondary | ICD-10-CM

## 2022-08-10 DIAGNOSIS — S93401D Sprain of unspecified ligament of right ankle, subsequent encounter: Secondary | ICD-10-CM

## 2022-08-10 NOTE — Progress Notes (Signed)
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  I would like to refer her to an orthopedist for this she is okay with that then please let me know and if she has a preference for provider or location please let me know.

## 2022-08-10 NOTE — Progress Notes (Signed)
Call pt: MRI of the ankle does show some swelling and a heel spur.  But nothing specific to explain the pain all the ligaments and tendons look good.  Again the only thing that they are noting is a heel spur on the very bottom of the hill the part of the heel that actually touches the ground.  If she is having pain in that area I can always refer her to podiatrist and see if they can help.

## 2022-08-17 ENCOUNTER — Encounter: Payer: 59 | Admitting: Obstetrics and Gynecology

## 2022-09-01 ENCOUNTER — Telehealth: Payer: Self-pay | Admitting: Family Medicine

## 2022-09-01 ENCOUNTER — Ambulatory Visit (INDEPENDENT_AMBULATORY_CARE_PROVIDER_SITE_OTHER): Payer: 59 | Admitting: Family Medicine

## 2022-09-01 ENCOUNTER — Encounter: Payer: Self-pay | Admitting: Family Medicine

## 2022-09-01 VITALS — BP 134/62 | HR 56 | Temp 98.8°F | Wt 202.0 lb

## 2022-09-01 DIAGNOSIS — S61412A Laceration without foreign body of left hand, initial encounter: Secondary | ICD-10-CM

## 2022-09-01 NOTE — Telephone Encounter (Signed)
Faxed form. Patient advised.

## 2022-09-01 NOTE — Telephone Encounter (Signed)
Patient called about the note that was written today to return to work be faxed to her office please Fax [phone]  attn: [phone]

## 2022-09-01 NOTE — Progress Notes (Signed)
   Acute Office Visit  Subjective:     Patient ID: Candace Greene, female    DOB: 05/10/1970, 52 y.o.   MRN: [id number]  Chief Complaint  Patient presents with   Wound Check    HPI Patient is in today for follow-up from laceration to left palm hand at base of thenar eminence while she was cleaning glass.  She was seen in the emergency department on May 5.  X-rays was negative for foreign body.  Wound was repaired with sutures and she was given a prescription for Keflex.Finger have beenswelling on and off.    ROS      Objective:    BP 134/62   Pulse (!) 56   Temp 98.8 F (37.1 C) (Oral)   Wt 202 lb (91.6 kg)   SpO2 99%   BMI 36.95 kg/m    Physical Exam Vitals reviewed.  Constitutional:      Appearance: She is well-developed.  HENT:     Head: Normocephalic and atraumatic.  Eyes:     Conjunctiva/sclera: Conjunctivae normal.  Cardiovascular:     Rate and Rhythm: Normal rate.  Pulmonary:     Effort: Pulmonary effort is normal.  Skin:    General: Skin is dry.     Coloration: Skin is not pale.     Comments: Incison C/D/I, healing well. No active drainage or erythema.  Thenar eminence is swollen but feels soft.   Neurological:     Mental Status: She is alert and oriented to person, place, and time.  Psychiatric:        Behavior: Behavior normal.     No results found for any visits on 09/01/22.      Assessment & Plan:   Problem List Items Addressed This Visit   None Visit Diagnoses     Laceration of left hand without foreign body, initial encounter    -  Primary      Incision is healing well.  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  She already has an appointment scheduled for next Tuesday to have the sutures removed and that we will give her a couple more days afterwards to make sure that she is  well-healed and able to go back to work.  Discussed that the hand swelling intermittently is probably just some dependent edema and to try to keep it a little elevated when that happens.  Recommend applying Vaseline 3 days before suture removal.  Make sure to complete antibiotics.  Call back if any concerns or problems  No orders of the defined types were placed in this encounter.   No follow-ups on file.  Nani Gasser, MD

## 2022-09-06 ENCOUNTER — Encounter: Payer: Self-pay | Admitting: Family Medicine

## 2022-09-06 ENCOUNTER — Ambulatory Visit (INDEPENDENT_AMBULATORY_CARE_PROVIDER_SITE_OTHER): Payer: 59 | Admitting: Family Medicine

## 2022-09-06 ENCOUNTER — Telehealth: Payer: Self-pay | Admitting: Family Medicine

## 2022-09-06 DIAGNOSIS — S61412A Laceration without foreign body of left hand, initial encounter: Secondary | ICD-10-CM | POA: Diagnosis not present

## 2022-09-06 DIAGNOSIS — Z4802 Encounter for removal of sutures: Secondary | ICD-10-CM

## 2022-09-06 NOTE — Patient Instructions (Signed)
Regular wound care.  Okay to get the area wet.  Call if any concerns if it is not continuing to heal and improvement of the swelling not resolving.  Okay to return to work on Monday.

## 2022-09-06 NOTE — Telephone Encounter (Signed)
I printed pts FMLA papers for her and then placed in Dr. Norman Herrlich basket to be filled out.  Please fax and call patient when they are ready.

## 2022-09-06 NOTE — Progress Notes (Signed)
   Established Patient Office Visit  Subjective   Patient ID: Candace Greene, female    DOB: 08/06/1970  Age: 52 y.o. MRN: [id number]  Chief Complaint  Patient presents with   Suture / Staple Removal    HPI Here for suture removal from palm of hand from laceration to her left hand from a piece of glass.  She says at times it still hurts and occasionally will throb.  She has not had any drainage from the incision.  No fevers or chills.     ROS    Objective:     There were no vitals taken for this visit.   Physical Exam Musculoskeletal:     Comments: Hand and fingers with normal range of motion.  There is a little bit of swelling and bruising over the thenar eminence.     Incision is clean dry and intact.  Sutures easily removed.  Patient tolerated procedure well.  There is still a little bit of bruising and swelling over the thenar eminence.  No results found for any visits on 09/06/22.    The ASCVD Risk score (Arnett DK, et al., 2019) failed to calculate for the following reasons:   Cannot find a previous HDL lab   Cannot find a previous total cholesterol lab    Assessment & Plan:   Problem List Items Addressed This Visit   None Visit Diagnoses     Visit for suture removal    -  Primary   Laceration of left hand without foreign body, initial encounter          Follow-up wound care discussed. Okay to return to work on Monday. Look out for FMLA forms.  Complete as soon as we get those then.   No follow-ups on file.    Nani Gasser, MD

## 2022-09-07 NOTE — Telephone Encounter (Signed)
Completed and placed in Tony B basket 

## 2022-09-08 NOTE — Telephone Encounter (Addendum)
Called pt and informed her that her forms have been completed and faxed.   Copy scanned into chart

## 2022-11-08 ENCOUNTER — Other Ambulatory Visit: Payer: Self-pay | Admitting: Family Medicine

## 2022-11-08 DIAGNOSIS — Z1231 Encounter for screening mammogram for malignant neoplasm of breast: Secondary | ICD-10-CM

## 2022-11-11 ENCOUNTER — Ambulatory Visit: Payer: 59 | Admitting: Sports Medicine

## 2022-11-24 ENCOUNTER — Encounter: Payer: Self-pay | Admitting: Family Medicine

## 2022-11-24 ENCOUNTER — Ambulatory Visit (INDEPENDENT_AMBULATORY_CARE_PROVIDER_SITE_OTHER): Payer: 59 | Admitting: Family Medicine

## 2022-11-24 VITALS — BP 107/77 | HR 64 | Ht 62.0 in | Wt 205.0 lb

## 2022-11-24 DIAGNOSIS — J0110 Acute frontal sinusitis, unspecified: Secondary | ICD-10-CM

## 2022-11-24 DIAGNOSIS — M722 Plantar fascial fibromatosis: Secondary | ICD-10-CM

## 2022-11-24 MED ORDER — AZITHROMYCIN 250 MG PO TABS: ORAL_TABLET | ORAL | 0 refills | Status: AC

## 2022-11-24 NOTE — Patient Instructions (Signed)
If your foot is not improving with the exercises then please let me know and we will get you in with Dr. Karie Schwalbe or possibly with podiatry which ever you prefer.

## 2022-11-24 NOTE — Progress Notes (Signed)
   Established Patient Office Visit  Subjective   Patient ID: Candace Greene, female    DOB: 1970/07/21  Age: 52 y.o. MRN: [id number]  Chief Complaint  Patient presents with   Joint Swelling   Sinusitis    X 3 weeks    HPI  Having swelling in her right foot.  Also getting some pain on the bottom of her right foot. Worse when she has been resting for awhile and then stands up and put pressure on the foot.  Then when she keeps walking it gets a little bit better.  Reports that she has had a lot of nasal congestion itchy ears for about 3 weeks.  She has had some sinus drainage but it has not been a lot she has had a lot of right-sided facial and sinus pressure.  It has been hard to sleep at night because of the congestion.  Taking Allegra.  She says she thought it helped initially but now it does not seem to be doing anything.    ROS    Objective:     BP 107/77   Pulse 64   Ht 5\' 2"  (1.575 m)   Wt 205 lb (93 kg)   SpO2 98%   BMI 37.49 kg/m    Physical Exam Vitals reviewed.  Constitutional:      Appearance: She is well-developed.  HENT:     Head: Normocephalic and atraumatic.  Eyes:     Conjunctiva/sclera: Conjunctivae normal.  Cardiovascular:     Rate and Rhythm: Normal rate.  Pulmonary:     Effort: Pulmonary effort is normal.  Skin:    General: Skin is dry.     Coloration: Skin is not pale.     Comments: Trace pitting edema on the top of the right foot.  Nontender around the ankle.  Neurological:     Mental Status: She is alert and oriented to person, place, and time.  Psychiatric:        Behavior: Behavior normal.      No results found for any visits on 11/24/22.    The ASCVD Risk score (Arnett DK, et al., 2019) failed to calculate for the following reasons:   Cannot find a previous HDL lab   Cannot find a previous total cholesterol lab    Assessment & Plan:   Problem List Items Addressed This Visit   None Visit Diagnoses     Plantar  fasciitis, right    -  Primary   Acute non-recurrent frontal sinusitis       Relevant Medications   azithromycin (ZITHROMAX) 250 MG tablet      Acute sinusitis-will treat with azithromycin call if not better in 1 week.  Still could be an allergic component to her symptoms though the Allegra does not seem to be helping and she also tried nasal spray.   Plantar fasciitis of the right foot-handout given to do some stretches and exercises on her own at home as well as a Thera-Band.  Recommend icing the area as needed and try to keep pressure off of it when she can.  If not improving over the next 3 to 4 weeks recommend referral to sports medicine or podiatry.  She does have some swelling on the top of the right foot but does get this intermittently.  Suspect probably just some venous stasis from prior old injury.  Return if symptoms worsen or fail to improve.    Nani Gasser, MD

## 2022-12-13 ENCOUNTER — Telehealth: Payer: Self-pay | Admitting: Family Medicine

## 2022-12-13 DIAGNOSIS — Z7184 Encounter for health counseling related to travel: Secondary | ICD-10-CM

## 2022-12-13 NOTE — Telephone Encounter (Signed)
Pt is going on a cruise and is wondering you will call her in the nausea patch to wear while she is on a cruise ship. [facility] on [address] is her pharmacy

## 2022-12-14 ENCOUNTER — Ambulatory Visit: Payer: 59

## 2022-12-14 MED ORDER — SCOPOLAMINE 1 MG/3DAYS TD PT72
1.0000 | MEDICATED_PATCH | TRANSDERMAL | 0 refills | Status: DC
Start: 2022-12-14 — End: 2023-05-22

## 2022-12-14 NOTE — Telephone Encounter (Signed)
How many days on cruise. Each patch will last 3 days

## 2022-12-14 NOTE — Telephone Encounter (Signed)
Current Meds  Medication Sig   scopolamine (TRANSDERM-SCOP) 1 MG/3DAYS Place 1 patch (1.5 mg total) onto the skin every 3 (three) days.

## 2022-12-15 ENCOUNTER — Other Ambulatory Visit: Payer: Self-pay | Admitting: Radiology

## 2023-01-05 ENCOUNTER — Ambulatory Visit (INDEPENDENT_AMBULATORY_CARE_PROVIDER_SITE_OTHER): Payer: 59

## 2023-01-05 DIAGNOSIS — Z1231 Encounter for screening mammogram for malignant neoplasm of breast: Secondary | ICD-10-CM

## 2023-01-06 NOTE — Progress Notes (Signed)
Please call patient. Normal mammogram.  Repeat in 1 year.  

## 2023-01-25 ENCOUNTER — Ambulatory Visit: Payer: 59

## 2023-01-26 ENCOUNTER — Ambulatory Visit (INDEPENDENT_AMBULATORY_CARE_PROVIDER_SITE_OTHER): Payer: 59 | Admitting: Family Medicine

## 2023-01-26 ENCOUNTER — Encounter: Payer: Self-pay | Admitting: Family Medicine

## 2023-01-26 VITALS — BP 127/72 | HR 54 | Ht 62.0 in | Wt 208.0 lb

## 2023-01-26 DIAGNOSIS — M79674 Pain in right toe(s): Secondary | ICD-10-CM

## 2023-01-26 DIAGNOSIS — W57XXXA Bitten or stung by nonvenomous insect and other nonvenomous arthropods, initial encounter: Secondary | ICD-10-CM

## 2023-01-26 DIAGNOSIS — Z23 Encounter for immunization: Secondary | ICD-10-CM

## 2023-01-26 MED ORDER — CEPHALEXIN 500 MG PO CAPS
500.0000 mg | ORAL_CAPSULE | Freq: Three times a day (TID) | ORAL | 0 refills | Status: DC
Start: 2023-01-26 — End: 2023-02-17

## 2023-01-26 MED ORDER — CLOTRIMAZOLE-BETAMETHASONE 1-0.05 % EX CREA
1.0000 | TOPICAL_CREAM | Freq: Two times a day (BID) | CUTANEOUS | 0 refills | Status: DC
Start: 2023-01-26 — End: 2023-05-22

## 2023-01-26 NOTE — Progress Notes (Signed)
   Acute Office Visit  Subjective:     Patient ID: Candace Greene, female    DOB: 18-Jul-1970, 52 y.o.   MRN: [id number]  Chief Complaint  Patient presents with   Insect Bite    HPI Patient is in today for pain between her right great toe and second toe.  She says it started on Sunday she thought maybe it was a bug bite or an insect sting or spider bite she never actually saw anything but it came on pretty rapidly to the point where she was having to take her shoe off.  She is been try to keep her foot open but she says at night it has been getting so itchy and irritated that it is literally waking her up.  She is been trying some over-the-counter topical creams but nothing seems to really be providing relief.  ROS      Objective:    BP 127/72   Pulse (!) 54   Ht 5\' 2"  (1.575 m)   Wt 208 lb (94.3 kg)   LMP 01/04/2023   SpO2 98%   BMI 38.04 kg/m    Physical Exam Musculoskeletal:     Comments: Right great toe is mildly swollen with some erythema in the webspace between the great toe and the second toe.  No distinct lesion or bite or wound.  No active drainage.   No results found for any visits on 01/26/23.      Assessment & Plan:   Problem List Items Addressed This Visit   None Visit Diagnoses     Immunization due    -  Primary   Relevant Orders   Flu vaccine trivalent PF, 6mos and older(Flulaval,Afluria,Fluarix,Fluzone)   Great toe pain, right       Relevant Medications   cephALEXin (KEFLEX) 500 MG capsule   clotrimazole-betamethasone (LOTRISONE) cream   Bug bite, initial encounter       Relevant Medications   cephALEXin (KEFLEX) 500 MG capsule   clotrimazole-betamethasone (LOTRISONE) cream       Unlear etiology.  I do not see a distinct lesion or spider bite etc.  But certainly that is very possible and probable since it has been going on for several days at this point without significant relief we did discuss taking an oral antihistamine as well as using  topical Lotrisone.  She does have a little bit of redness in between the webspaces which could also be a fungus.  Will also treat with oral Keflex for 3 days and call if not better.  Call if not better after the weekend.    Meds ordered this encounter  Medications   cephALEXin (KEFLEX) 500 MG capsule    Sig: Take 1 capsule (500 mg total) by mouth 3 (three) times daily.    Dispense:  15 capsule    Refill:  0   clotrimazole-betamethasone (LOTRISONE) cream    Sig: Apply 1 Application topically 2 (two) times daily.    Dispense:  30 g    Refill:  0    No follow-ups on file.  Nani Gasser, MD

## 2023-02-17 ENCOUNTER — Ambulatory Visit (INDEPENDENT_AMBULATORY_CARE_PROVIDER_SITE_OTHER): Payer: 59 | Admitting: Family Medicine

## 2023-02-17 ENCOUNTER — Encounter: Payer: Self-pay | Admitting: Family Medicine

## 2023-02-17 VITALS — BP 124/84 | HR 58 | Ht 62.0 in | Wt 208.5 lb

## 2023-02-17 DIAGNOSIS — Z131 Encounter for screening for diabetes mellitus: Secondary | ICD-10-CM

## 2023-02-17 DIAGNOSIS — M7989 Other specified soft tissue disorders: Secondary | ICD-10-CM

## 2023-02-17 DIAGNOSIS — J0110 Acute frontal sinusitis, unspecified: Secondary | ICD-10-CM | POA: Diagnosis not present

## 2023-02-17 DIAGNOSIS — R7301 Impaired fasting glucose: Secondary | ICD-10-CM | POA: Insufficient documentation

## 2023-02-17 DIAGNOSIS — M79675 Pain in left toe(s): Secondary | ICD-10-CM

## 2023-02-17 DIAGNOSIS — M79674 Pain in right toe(s): Secondary | ICD-10-CM

## 2023-02-17 LAB — POCT INFLUENZA A/B
Influenza A, POC: NEGATIVE
Influenza B, POC: NEGATIVE

## 2023-02-17 LAB — POCT GLYCOSYLATED HEMOGLOBIN (HGB A1C): Hemoglobin A1C: 6.3 % — AB (ref 4.0–5.6)

## 2023-02-17 LAB — POC COVID19 BINAXNOW: SARS Coronavirus 2 Ag: NEGATIVE

## 2023-02-17 MED ORDER — AMOXICILLIN-POT CLAVULANATE 875-125 MG PO TABS
1.0000 | ORAL_TABLET | Freq: Two times a day (BID) | ORAL | 0 refills | Status: DC
Start: 2023-02-17 — End: 2023-05-10

## 2023-02-17 NOTE — Patient Instructions (Signed)
Your hemoglobin A1c was 6.3 today.  Please see additional information below about diet.

## 2023-02-17 NOTE — Progress Notes (Signed)
Acute Office Visit  Subjective:     Patient ID: Candace Greene, female    DOB: Jun 18, 1970, 52 y.o.   MRN: [id number]  Chief Complaint  Patient presents with   Sinus Problem    Pt believes she may have a sinus infection. Symptoms include congestion, sneezing    HPI Patient is in today for sinus sxs.  She says she started feeling bad on Friday.  By the next day she felt so bad with myalgias and chills and sweats that she stayed in bed for most 2 days.  She started developing a sore throat initially but that seems to have improved.  And then she started getting a lot of sinus pressure pain congestion sneezing and postnasal drip.  She says even her facial cheeks were really swollen.  Also still had of a lot of pain in her toes.she is wondering if she could be diabetic.    She has also had some swelling in the back of her right hand.  She says it looks better today it was really swollen. No trauma or pain.    ROS      Objective:    BP 124/84 (BP Location: Left Arm, Patient Position: Sitting, Cuff Size: Normal)   Pulse (!) 58   Ht 5\' 2"  (1.575 m)   Wt 208 lb 8 oz (94.6 kg)   SpO2 99%   BMI 38.14 kg/m    Physical Exam Constitutional:      Appearance: Normal appearance.  HENT:     Head: Normocephalic and atraumatic.     Right Ear: Tympanic membrane, ear canal and external ear normal. There is no impacted cerumen.     Left Ear: Tympanic membrane, ear canal and external ear normal. There is no impacted cerumen.     Nose: Nose normal.     Mouth/Throat:     Pharynx: Oropharynx is clear.  Eyes:     Conjunctiva/sclera: Conjunctivae normal.  Cardiovascular:     Rate and Rhythm: Normal rate and regular rhythm.  Pulmonary:     Effort: Pulmonary effort is normal.     Breath sounds: Normal breath sounds.  Musculoskeletal:     Cervical back: Neck supple. No tenderness.  Lymphadenopathy:     Cervical: No cervical adenopathy.  Skin:    General: Skin is warm and dry.   Neurological:     Mental Status: She is alert and oriented to person, place, and time.  Psychiatric:        Mood and Affect: Mood normal.     Results for orders placed or performed in visit on 02/17/23  POCT HgB A1C  Result Value Ref Range   Hemoglobin A1C 6.3 (A) 4.0 - 5.6 %   HbA1c POC (<> result, manual entry)     HbA1c, POC (prediabetic range)     HbA1c, POC (controlled diabetic range)    POCT Influenza A/B  Result Value Ref Range   Influenza A, POC Negative Negative   Influenza B, POC Negative Negative  POC COVID-19  Result Value Ref Range   SARS Coronavirus 2 Ag Negative Negative        Assessment & Plan:   Problem List Items Addressed This Visit       Endocrine   IFG (impaired fasting glucose)    Discussed dx.  Work on cutting back on sugar, carbs, etc.  Eating more protein.  F/U in 3 months.       Other Visit Diagnoses  Acute non-recurrent frontal sinusitis    -  Primary   Relevant Medications   amoxicillin-clavulanate (AUGMENTIN) 875-125 MG tablet   Other Relevant Orders   POCT Influenza A/B (Completed)   POC COVID-19 (Completed)   Hand swelling       Relevant Orders   POCT Influenza A/B (Completed)   POC COVID-19 (Completed)   Pain in toes of both feet       Relevant Orders   POCT Influenza A/B (Completed)   POC COVID-19 (Completed)   Screening for diabetes mellitus       Relevant Orders   POCT HgB A1C (Completed)      Acute frontal sinusitis-I did go ahead and swab for COVID and flu today just so that she can notify those around her she is out of the window for antiviral treatment.  I am concerned that she is still having significant sinus pain and facial swelling a week out some get a go ahead and treat her with an antibiotic.  Swelling of the dorsum of the right hand-unclear etiology does not really seem consistent with synovitis because it is not sore or painful it does seem to be resolving on its own.   Meds ordered this encounter   Medications   amoxicillin-clavulanate (AUGMENTIN) 875-125 MG tablet    Sig: Take 1 tablet by mouth 2 (two) times daily.    Dispense:  14 tablet    Refill:  0    No follow-ups on file.  Nani Gasser, MD

## 2023-02-17 NOTE — Assessment & Plan Note (Signed)
Discussed dx.  Work on cutting back on sugar, carbs, etc.  Eating more protein.  F/U in 3 months.

## 2023-04-03 ENCOUNTER — Ambulatory Visit: Payer: Self-pay | Admitting: Family Medicine

## 2023-04-03 NOTE — Telephone Encounter (Signed)
Called pt back to assess further and ensure doing okay and not needing ED. Pt stated she planned on calling front desk directly to try to schedule. Confirmed per pt request that no appts with PCP looked to be available for today or tomorrow, pt stated "if you can't schedule me an appt, you didn't need to call me back" and immediately hung up before nurse was able to give options. Unable to further assess, pt refusing to discuss with nurse. Forwarding to office staff now.

## 2023-04-03 NOTE — Telephone Encounter (Signed)
Reason for Disposition . [1] Caller demands to speak with the PCP AND [2] about sick adult (or sick caller)  Answer Assessment - Initial Assessment Questions 1. SITUATION:  Document reason for call.    Pt reporting "foot been bothering" her and "severe pain" in foot/toes, pt mentioned she is "pre-diabetic," and confirmed urinary frequency. Pt denies other urinary symptoms such as burning/pain with urination. Pt reporting she has had foot pain for 1 week. Pt currently at work on 10 min break, looking to schedule appt quickly for tomorrow. Unable to fully assess, no appts available within the week, pt needed to return to work and hung up phone stating, "just nevermind," before nurse could recommend UC or mobile bus, assess further, or give care advice. 2. BACKGROUND: Document any background information (e.g., prior calls, known psychiatric history)     Pt reporting that she only had 10 min for break and had to spend most of it talking to first agent and then having to talk to triage nurse and no appt availability. 3. ASSESSMENT: Document your nursing assessment.     Called pt back to assess further and ensure doing okay and not needing ED. Pt stated she planned on calling front desk directly to try to schedule. Confirmed per pt request that no appts with PCP looked to be available for today or tomorrow, pt stated "if you can't schedule me an appt, you didn't need to call me back" and immediately hung up before nurse was able to give options. Unable to further assess, pt refusing to discuss with nurse. Forwarding to office staff now. 4. RESPONSE: Document what your response or recommendation was.     Nurse tried to recommend UC or message PCP or call CAL for merged call to see about earlier appt but pt hung up before options were given each time. Forwarded message to PCP for office to try to assess further and advise care.  Protocols used: Difficult Call-A-AH

## 2023-04-03 NOTE — Telephone Encounter (Signed)
Copied from CRM [id number]. Topic: Clinical - Red Word Triage >> Apr 03, 2023 11:05 AM Lorin Glass B wrote: Red Word that prompted transfer to Nurse Triage: Severe pain in toes along with frequent urination.   Chief Complaint: Foot and toe pain Symptoms: Foot pain, toe pain, urinary frequency Frequency: Unable to assess, pt needed to return to work and hung up phone Pertinent Negatives: Patient denies other urinary symptoms, burning/pain with urination, Unable to assess further, pt needed to return to work and hung up phone Disposition: [] ED /[] Urgent Care (no appt availability in office) / [] Appointment(In office/virtual)/ []  [facility] [facility] [facility]/ [] Home Care/ [] Refused Recommended Disposition /[] [facility]/ [x]  Follow-up with PCP Additional Notes:  Pt reporting "foot been bothering" her and "severe pain" in foot/toes, pt mentioned she is "pre-diabetic," and confirmed urinary frequency. Pt denies other urinary symptoms such as burning/pain with urination. Pt reporting she has had foot pain for 1 week. Pt currently at work on 10 min break, looking to schedule appt quickly for tomorrow. Unable to fully assess, no appts available within the week, pt needed to return to work and hung up phone stating, "just nevermind," before nurse could recommend UC or mobile bus, assess further, or give care advice. Answer Assessment - Initial Assessment Questions 1. ONSET: "When did the pain start?"      1 week ago 2. LOCATION: "Where is the pain located?"      "Foot has been bothering me," pain in toes 3. PAIN: "How bad is the pain?"    (Scale 1-10; or mild, moderate, severe)  - MILD (1-3): doesn't interfere with normal activities.   - MODERATE (4-7): interferes with normal activities (e.g., work or school) or awakens from sleep, limping.   - SEVERE (8-10): excruciating pain, unable to do any normal activities, unable to walk.      "Severe pain," confirms no trouble walking, at work today 4. WORK OR  EXERCISE: "Has there been any recent work or exercise that involved this part of the body?"      Unable to assess, pt needed to return to work and hung up phone 5. CAUSE: "What do you think is causing the foot pain?"      Unable to assess, pt needed to return to work and hung up phone, but mentioned she is "pre-diabetic" 6. OTHER SYMPTOMS: "Do you have any other symptoms?" (e.g., leg pain, rash, fever, numbness)     Pt reporting urinary frequency with no other urinary symptoms, denies excessive thirst, denies weakness  Protocols used: Foot Pain-A-AH

## 2023-04-04 NOTE — Telephone Encounter (Signed)
Patient scheduled for Jan 3rd with Dr. Linford Arnold. (Earliest patient could schedule)

## 2023-04-06 ENCOUNTER — Ambulatory Visit: Payer: Self-pay | Admitting: Family Medicine

## 2023-04-06 NOTE — Telephone Encounter (Signed)
Copied from CRM [id number]. Topic: Clinical - Pink Word Triage >> Apr 06, 2023 11:16 AM Mosetta Putt H wrote: Reason for CRM: pain in feet level 9/10 pain     Chief Complaint: Foot pain Symptoms: bilat foot pain with numbness and tingling Frequency: started a few weeks ago Pertinent Negatives: Patient denies fever or leg swelling Disposition: [] ED /[x] Urgent Care (no appt availability in office) / [] Appointment(In office/virtual)/ []  [facility]/ [] Home Care/ [] Refused Recommended Disposition /[] [facility]/ []  Follow-up with PCP Additional Notes: Pt reports bilateral foot pain, sts pain started a few weeks ago, temporary relief with Motrin. Pt concered about pre-diabetes. Pt unable to schedule visit for today, next avail is in January. Pt agreeable to go to Urgent Care for evaluation      1. ONSET: "When did the pain start?"      Started a few weeks ago  2. LOCATION: "Where is the pain located?"      Pain in toes in both feet; states the pain comes and goes  3. PAIN: "How bad is the pain?"    (Scale 1-10; or mild, moderate, severe)  - MILD (1-3): doesn't interfere with normal activities.   - MODERATE (4-7): interferes with normal activities (e.g., work or school) or awakens from sleep, limping.   - SEVERE (8-10): excruciating pain, unable to do any normal activities, unable to walk.      Sometimes the pain is unbeareable and wakes her up out of sleep Today the pain is 4/10  4. WORK OR EXERCISE: "Has there been any recent work or exercise that involved this part of the body?"      No new exercises or work  5. CAUSE: "What do you think is causing the foot pain?"     Unsure of potential causes, concerned from possibly being a pre-diabetic  6. OTHER SYMPTOMS: "Do you have any other symptoms?" (e.g., leg pain, rash, fever, numbness)     Hand swelling intermittently   7. PREGNANCY: "Is there any chance you are pregnant?" "When was your last menstrual period?"      No

## 2023-04-06 NOTE — Telephone Encounter (Signed)
Reason for Disposition . [1] MODERATE pain (e.g., interferes with normal activities, limping) AND [2] present > 3 days  Answer Assessment - Initial Assessment Questions 1. ONSET: "When did the pain start?"      Started a few weeks ago  2. LOCATION: "Where is the pain located?"      Pain in toes in both feet; states the pain comes and goes  3. PAIN: "How bad is the pain?"    (Scale 1-10; or mild, moderate, severe)  - MILD (1-3): doesn't interfere with normal activities.   - MODERATE (4-7): interferes with normal activities (e.g., work or school) or awakens from sleep, limping.   - SEVERE (8-10): excruciating pain, unable to do any normal activities, unable to walk.      Sometimes the pain is unbeareable and wakes her up out of sleep Today the pain is 4/10  4. WORK OR EXERCISE: "Has there been any recent work or exercise that involved this part of the body?"      No new exercises or work  5. CAUSE: "What do you think is causing the foot pain?"     Unsure of potential causes, concerned from possibly being a pre-diabetic  6. OTHER SYMPTOMS: "Do you have any other symptoms?" (e.g., leg pain, rash, fever, numbness)     Hand swelling intermittently   7. PREGNANCY: "Is there any chance you are pregnant?" "When was your last menstrual period?"     No  Protocols used: Foot Pain-A-AH

## 2023-04-07 NOTE — Telephone Encounter (Signed)
He can schedule with Dr. Karie Schwalbe if needed since she does not see me until January.

## 2023-04-11 NOTE — Telephone Encounter (Signed)
Attempted call to patient. Left a detailed voice mail message on patient home #  requesting a return call.

## 2023-04-28 ENCOUNTER — Ambulatory Visit: Payer: 59 | Admitting: Family Medicine

## 2023-05-09 ENCOUNTER — Ambulatory Visit: Payer: 59 | Admitting: Family Medicine

## 2023-05-10 ENCOUNTER — Ambulatory Visit
Admission: EM | Admit: 2023-05-10 | Discharge: 2023-05-10 | Disposition: A | Discharge status: Home / Self Care | Payer: 59 | Attending: Family Medicine | Admitting: Family Medicine

## 2023-05-10 ENCOUNTER — Other Ambulatory Visit: Payer: Self-pay

## 2023-05-10 DIAGNOSIS — J0100 Acute maxillary sinusitis, unspecified: Secondary | ICD-10-CM | POA: Diagnosis not present

## 2023-05-10 DIAGNOSIS — R0981 Nasal congestion: Secondary | ICD-10-CM

## 2023-05-10 DIAGNOSIS — R531 Weakness: Secondary | ICD-10-CM | POA: Diagnosis not present

## 2023-05-10 DIAGNOSIS — R6883 Chills (without fever): Secondary | ICD-10-CM | POA: Diagnosis not present

## 2023-05-10 LAB — POCT INFLUENZA A/B
Influenza A, POC: NEGATIVE
Influenza B, POC: NEGATIVE

## 2023-05-10 LAB — POC SARS CORONAVIRUS 2 AG -  ED: SARS Coronavirus 2 Ag: NEGATIVE

## 2023-05-10 MED ORDER — AMOXICILLIN-POT CLAVULANATE 875-125 MG PO TABS: 1.0000 | ORAL_TABLET | Freq: Two times a day (BID) | ORAL | 0 refills | Status: DC

## 2023-05-10 MED ORDER — PREDNISONE 20 MG PO TABS: ORAL_TABLET | ORAL | 0 refills | Status: DC

## 2023-05-10 NOTE — ED Triage Notes (Signed)
 Wednesday night started having sore throat, Thursday started having chills. Did have headache. Back hurting, nasal congestion, feeling weak, sneezing, cough. Has not checked temperature. Has been taking mucinex, sinus medication, nyquil.

## 2023-05-10 NOTE — Discharge Instructions (Addendum)
 Advised patient to take medications as directed with food to completion.  Advised patient to take prednisone  with first dose of Augmentin  for the next 5 of 7 days.  Encouraged to increase daily water intake to 64 ounces per day.  Advised if symptoms worsen and/or unresolved please follow-up with PCP or here for further evaluation.  Advised patient that COVID-19 and Influenza were negative this evening.  Advised if generalized weakness worsens please follow-up with your PCP or go to nearest ED further evaluation.

## 2023-05-10 NOTE — ED Provider Notes (Signed)
 [facility]    CSN: [id number] Arrival date & time: 05/10/23  1853      History   Chief Complaint Chief Complaint  Patient presents with   Chills    HPI Candace Greene is a 53 y.o. female.   HPI 53 year old female presents with chills, headache, congestion, and sore throat for 6 to 7 days.Candace Greene  PMH significant for pulmonary embolus, obesity, headache disorder, and uterine fibroid.  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   Moderate ankle sprain, right, subsequent encounter 02/16/2022   DDD (degenerative disc disease), lumbar 01/25/2022   Acute bilateral low back pain without sciatica 01/25/2022   Right lumbar radiculopathy 09/10/2021   Impingement syndrome, shoulder, left 09/10/2021   Primary osteoarthritis of right knee 09/10/2021   Migraine with status migrainosus, not intractable 06/08/2020   Headache disorder 10/07/2019   Slow transit constipation 06/21/2019   Ganglion cyst of dorsum of right wrist 04/01/2019   Intertrigo 04/01/2019   Nontraumatic rupture of extensor tendons of hand and wrist, right 04/01/2019   Carpal tunnel syndrome 05/11/2017   Cubital tunnel syndrome 05/11/2017   Right leg pain 06/27/2016   Fatty liver 11/04/2015   Pica 09/01/2015   DUB (dysfunctional uterine bleeding) 09/01/2015   Iron deficiency anemia 09/01/2015   Menorrhagia with irregular cycle 09/01/2015   Ovarian cyst 06/16/2015   Uterine fibroid 06/16/2015   Elevated lipase 06/15/2015   Abnormal echocardiogram 05/06/2015   History of pulmonary embolism 04/17/2015   Wrist tendonitis 11/28/2014    Past Surgical History:  Procedure Laterality Date   No prior surgery      OB History   No obstetric history on file.      Home Medications    Prior to Admission  medications   Medication Sig Start Date End Date Taking? Authorizing Provider  amoxicillin -clavulanate (AUGMENTIN ) 875-125 MG tablet Take 1 tablet by mouth every 12 (twelve) hours. 05/10/23  Yes Leonides Ramp, FNP  predniSONE  (DELTASONE ) 20 MG tablet Take 3 tabs PO daily x 5 days. 05/10/23  Yes Leonides Ramp, FNP  butalbital -acetaminophen -caffeine  (FIORICET) 50-325-40 MG tablet Take 1-2 tablets by mouth every 6 (six) hours as needed for headache. 04/30/20   Early, Sara E, NP  clotrimazole -betamethasone  (LOTRISONE ) cream Apply 1 Application topically 2 (two) times daily. 01/26/23   Cydney Draft, MD  cyclobenzaprine  (FLEXERIL ) 10 MG tablet Take 1 tablet (10 mg total) by mouth 3 (three) times daily as needed for muscle spasms. 01/25/22   Breeback, Jade L, PA-C  eletriptan  (RELPAX ) 40 MG tablet Take 1 tablet (40 mg total) by mouth as needed for migraine or headache. May repeat in 2 hours if headache persists or recurs. 09/09/21   Cydney Draft, MD  fluticasone  (FLONASE ) 50 MCG/ACT nasal spray Place 2 sprays into both nostrils daily. 02/19/20   Breeback, Jade L, PA-C  meloxicam  (MOBIC ) 15 MG tablet One tab PO every 24 hours with a meal for 2 weeks, then once every 24 hours prn pain. 02/16/22   Gean Keels, MD  nystatin  (MYCOSTATIN /NYSTOP ) powder Apply 1 Application topically 3 (three) times daily. X 7 days and PRN 04/13/22   Metheney, Catherine D, MD  nystatin  cream (MYCOSTATIN ) Apply 1 Application topically 2 (two) times daily. 04/13/22   Duaine German  D, MD  scopolamine  (TRANSDERM-SCOP) 1 MG/3DAYS Place 1 patch (1.5 mg total) onto the skin every 3 (three) days. 12/14/22   Cydney Draft, MD    Family History Family History  Problem Relation Age of Onset   Stroke Father        <60   Heart attack Father    Birth defects Other    Hyperlipidemia Other    Breast cancer Maternal Grandmother    Breast cancer Maternal Aunt    Cancer Other        GF    Social  History Social History   Tobacco Use   Smoking status: Never   Smokeless tobacco: Never  Vaping Use   Vaping status: Never Used  Substance Use Topics   Alcohol use: Yes    Alcohol/week: 0.0 standard drinks of alcohol    Comment: 3 glasses wine per night   Drug use: No     Allergies   Maxalt  [rizatriptan ]   Review of Systems Review of Systems  Constitutional:  Positive for chills.  HENT:  Positive for congestion and sore throat.   Neurological:  Positive for weakness and headaches.  All other systems reviewed and are negative.    Physical Exam Triage Vital Signs ED Triage Vitals  Encounter Vitals Group     BP      Systolic BP Percentile      Diastolic BP Percentile      Pulse      Resp      Temp      Temp src      SpO2      Weight      Height      Head Circumference      Peak Flow      Pain Score      Pain Loc      Pain Education      Exclude from Growth Chart    No data found.  Updated Vital Signs BP 125/71   Pulse 66   Temp (!) 97.5 F (36.4 C)   Resp 16   SpO2 98%    Physical Exam Vitals and nursing note reviewed.  Constitutional:      General: She is not in acute distress.    Appearance: Normal appearance. She is normal weight. She is not ill-appearing.  HENT:     Head: Normocephalic and atraumatic.     Right Ear: Tympanic membrane and external ear normal.     Left Ear: Tympanic membrane and external ear normal.     Ears:     Comments: Significant eustachian tube dysfunction noted bilaterally    Nose:     Comments: Turbinates are erythematous/edematous    Mouth/Throat:     Mouth: Mucous membranes are moist.     Pharynx: Oropharynx is clear.  Eyes:     Extraocular Movements: Extraocular movements intact.     Conjunctiva/sclera: Conjunctivae normal.     Pupils: Pupils are equal, round, and reactive to light.  Cardiovascular:     Rate and Rhythm: Normal rate and regular rhythm.     Pulses: Normal pulses.     Heart sounds: Normal  heart sounds.  Pulmonary:     Effort: Pulmonary effort is normal.     Breath sounds: Normal breath sounds. No wheezing, rhonchi or rales.  Musculoskeletal:        General: Normal range of motion.     Cervical back: Normal range of motion and neck supple. No tenderness.  Lymphadenopathy:     Cervical: No cervical adenopathy.  Skin:    General: Skin is warm and dry.  Neurological:     General: No focal deficit present.     Mental Status: She is alert and oriented to person, place, and time. Mental status is at baseline.  Psychiatric:        Mood and Affect: Mood normal.        Behavior: Behavior normal.      UC Treatments / Results  Labs (all labs ordered are listed, but only abnormal results are displayed) Labs Reviewed  POC SARS CORONAVIRUS 2 AG -  ED  POCT INFLUENZA A/B    EKG   Radiology No results found.  Procedures Procedures (including critical care time)  Medications Ordered in UC Medications - No data to display  Initial Impression / Assessment and Plan / UC Course  I have reviewed the triage vital signs and the nursing notes.  Pertinent labs & imaging results that were available during my care of the patient were reviewed by me and considered in my medical decision making (see chart for details).    MDM: 1.  Acute maxillary sinusitis, recurrence not specified-Rx'd Augmentin  875/125 mg tablet: Take 1 tablet twice daily x 7 days; 2.  Congestion of nasal sinus-Rx'd prednisone  20 mg tablet: Take 3 tabs p.o. daily x 5 days; 3.  Chills-influenza and COVID-19 were negative this evening; 4.  Weakness generalized-advised patient if generalized weakness persist please follow-up with PCP for further evaluation. Advised patient to take medications as directed with food to completion.  Advised patient to take prednisone  with first dose of Augmentin  for the next 5 of 7 days.  Encouraged to increase daily water intake to 64 ounces per day.  Advised if symptoms worsen and/or  unresolved please follow-up with PCP or here for further evaluation.  Advised patient that COVID-19 and Influenza were negative this evening.  Advised if generalized weakness worsens please follow-up with your PCP or go to nearest ED further evaluation. Final Clinical Impressions(s) / UC Diagnoses   Final diagnoses:  Chills  Weakness generalized  Acute maxillary sinusitis, recurrence not specified  Congestion of nasal sinus     Discharge Instructions      Advised patient to take medications as directed with food to completion.  Advised patient to take prednisone  with first dose of Augmentin  for the next 5 of 7 days.  Encouraged to increase daily water intake to 64 ounces per day.  Advised if symptoms worsen and/or unresolved please follow-up with PCP or here for further evaluation.  Advised patient that COVID-19 and Influenza were negative this evening.  Advised if generalized weakness worsens please follow-up with your PCP or go to nearest ED further evaluation.     ED Prescriptions     Medication Sig Dispense Auth. Provider   amoxicillin -clavulanate (AUGMENTIN ) 875-125 MG tablet Take 1 tablet by mouth every 12 (twelve) hours. 14 tablet Fayelynn Distel, FNP   predniSONE  (DELTASONE ) 20 MG tablet Take 3 tabs PO daily x 5 days. 15 tablet Shoshana Johal, FNP      PDMP not reviewed this encounter.   Leonides Ramp, FNP 05/10/23 1942

## 2023-05-22 ENCOUNTER — Ambulatory Visit: Payer: 59

## 2023-05-22 ENCOUNTER — Ambulatory Visit: Payer: 59 | Admitting: Family Medicine

## 2023-05-22 ENCOUNTER — Encounter: Payer: Self-pay | Admitting: Family Medicine

## 2023-05-22 VITALS — BP 119/40 | HR 69 | Ht 62.0 in | Wt 210.0 lb

## 2023-05-22 DIAGNOSIS — R059 Cough, unspecified: Secondary | ICD-10-CM | POA: Diagnosis not present

## 2023-05-22 DIAGNOSIS — J22 Unspecified acute lower respiratory infection: Secondary | ICD-10-CM

## 2023-05-22 DIAGNOSIS — R0602 Shortness of breath: Secondary | ICD-10-CM | POA: Diagnosis not present

## 2023-05-22 DIAGNOSIS — R7301 Impaired fasting glucose: Secondary | ICD-10-CM

## 2023-05-22 DIAGNOSIS — R052 Subacute cough: Secondary | ICD-10-CM

## 2023-05-22 LAB — POCT GLYCOSYLATED HEMOGLOBIN (HGB A1C): Hemoglobin A1C: 5.5 % (ref 4.0–5.6)

## 2023-05-22 MED ORDER — HYDROCODONE BIT-HOMATROP MBR 5-1.5 MG/5ML PO SOLN: 5.0000 mL | Freq: Every evening | ORAL | 0 refills | Status: DC | PRN

## 2023-05-22 MED ORDER — AZITHROMYCIN 250 MG PO TABS: ORAL_TABLET | ORAL | 0 refills | Status: AC

## 2023-05-22 MED ORDER — FLUCONAZOLE 150 MG PO TABS: 150.0000 mg | ORAL_TABLET | Freq: Once | ORAL | 1 refills | Status: AC

## 2023-05-22 NOTE — Progress Notes (Signed)
Established Patient Office Visit  Subjective  Patient ID: Candace Greene, female    DOB: 08/24/1970  Age: 53 y.o. MRN: [id number]  Chief Complaint  Patient presents with   ifg    HPI  Impaired fasting glucose-no increased thirst or urination. No symptoms consistent with hypoglycemia.  He was seen in urgent care on January 15 and treated with antibiotics and steroids for sinusitis.  She says she is just not feeling better she still coughing up a lot of mucus and now her chest and her back hurt and she is actually getting short of breath with walking.  She has not actually checked her temperature but has had times where she felt warm like she might be running a fever.  ROS    Objective:     BP (!) 119/40   Pulse 69   Ht 5\' 2"  (1.575 m)   Wt 210 lb (95.3 kg)   SpO2 96%   BMI 38.41 kg/m     Physical Exam Constitutional:      Appearance: Normal appearance.  HENT:     Head: Normocephalic and atraumatic.     Right Ear: Tympanic membrane, ear canal and external ear normal. There is no impacted cerumen.     Left Ear: Tympanic membrane, ear canal and external ear normal. There is no impacted cerumen.     Nose: Nose normal.     Mouth/Throat:     Pharynx: Oropharynx is clear.  Eyes:     Conjunctiva/sclera: Conjunctivae normal.  Cardiovascular:     Rate and Rhythm: Normal rate and regular rhythm.  Pulmonary:     Effort: Pulmonary effort is normal.     Breath sounds: Normal breath sounds.     Comments: Coarse BS bilaterally Musculoskeletal:     Cervical back: Neck supple. No tenderness.  Lymphadenopathy:     Cervical: No cervical adenopathy.  Skin:    General: Skin is warm and dry.  Neurological:     Mental Status: She is alert and oriented to person, place, and time.  Psychiatric:        Mood and Affect: Mood normal.      Results for orders placed or performed in visit on 05/22/23  POCT HgB A1C  Result Value Ref Range   Hemoglobin A1C 5.5 4.0 - 5.6 %   HbA1c  POC (<> result, manual entry)     HbA1c, POC (prediabetic range)     HbA1c, POC (controlled diabetic range)         The ASCVD Risk score (Arnett DK, et al., 2019) failed to calculate for the following reasons:   Cannot find a previous HDL lab   Cannot find a previous total cholesterol lab    Assessment & Plan:   Problem List Items Addressed This Visit       Endocrine   IFG (impaired fasting glucose) - Primary   1C looks phenomenal today and is down to 5.5 which is fantastic she is on a great job with her diet.      Relevant Orders   POCT HgB A1C (Completed)   Other Visit Diagnoses       Subacute cough       Relevant Medications   azithromycin (ZITHROMAX) 250 MG tablet   Other Relevant Orders   DG Chest 2 View     Lower resp. tract infection       Relevant Medications   azithromycin (ZITHROMAX) 250 MG tablet   fluconazole (DIFLUCAN) 150 MG tablet  Cough with lower respiratory tract infection-we will get a chest x-ray since she actually feels like she is getting worse, as she is now feeling  shortness of breath.  Will treat with azithromycin.  Call if not better in 1 week  No follow-ups on file.    Nani Gasser, MD

## 2023-05-22 NOTE — Assessment & Plan Note (Signed)
1C looks phenomenal today and is down to 5.5 which is fantastic she is on a great job with her diet.

## 2023-05-23 ENCOUNTER — Telehealth: Payer: Self-pay | Admitting: Family Medicine

## 2023-05-23 NOTE — Telephone Encounter (Signed)
Copied from CRM [id number]. Topic: Clinical - Lab/Test Results >> May 23, 2023  2:34 PM Nila Nephew wrote: Reason for CRM: Patient would like results for X-Ray to be left on her voicemail when they come, at number on file ending in [id number].

## 2023-06-02 NOTE — Telephone Encounter (Signed)
 Called and LVM advising pt of Nl CXR results

## 2023-06-26 ENCOUNTER — Telehealth: Payer: Self-pay | Admitting: Family Medicine

## 2023-06-26 NOTE — Telephone Encounter (Signed)
 Copied from CRM [id number]. Topic: General - Call Back - No Documentation >> Jun 26, 2023 12:10 PM Thliyah D wrote: Reason for CRM: Patient is wanting a call back. She want to speak with Victorino Dike at the front office.

## 2023-06-28 NOTE — Telephone Encounter (Signed)
 Attempted call to patient. Left a voice mail message requesting a return call.

## 2023-06-29 NOTE — Telephone Encounter (Signed)
 Again attempted call to patient. Left a voice mail message requesting a return call.

## 2023-06-30 ENCOUNTER — Ambulatory Visit: Payer: Self-pay | Admitting: Family Medicine
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
## 2023-08-25 ENCOUNTER — Encounter: Payer: Self-pay | Admitting: Family Medicine

## 2023-08-25 ENCOUNTER — Ambulatory Visit (INDEPENDENT_AMBULATORY_CARE_PROVIDER_SITE_OTHER): Admitting: Family Medicine

## 2023-08-25 VITALS — BP 128/72 | HR 72 | Ht 62.0 in | Wt 208.0 lb

## 2023-08-25 DIAGNOSIS — M25511 Pain in right shoulder: Secondary | ICD-10-CM | POA: Diagnosis not present

## 2023-08-25 MED ORDER — MELOXICAM 15 MG PO TABS: 15.0000 mg | ORAL_TABLET | Freq: Every day | ORAL | 1 refills | Status: DC

## 2023-08-25 NOTE — Progress Notes (Signed)
   Established Patient Office Visit  Subjective  Patient ID: Candace Greene, female    DOB: Dec 17, 1970  Age: 53 y.o. MRN: [id number]  Chief Complaint  Patient presents with   Shoulder Pain    R shoulder 9/10 throbbing limited movement. Waking her up several times during the night    HPI  She complains of right shoulder pain that is been bothering her for a few months at this point she just gotten to the point where it is starting to make it difficult to sleep and rest at night she has used topical rubs heat, ice, and arthritis medicines just does not seem to be getting a lot better.  It is painful with certain movements but she is not limited in her motion she thinks she was told she had a rotator cuff issue in that shoulder before she is right-handed.  He says it feels swollen at times.  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    ROS    Objective:     BP 128/72   Pulse 72   Ht 5\' 2"  (1.575 m)   Wt 208 lb (94.3 kg)   SpO2 98%   BMI 38.04 kg/m    Physical Exam Vitals reviewed.  Constitutional:      Appearance: Normal appearance.  HENT:     Head: Normocephalic.  Pulmonary:     Effort: Pulmonary effort is normal.  Musculoskeletal:     Comments: Right shoulder with normal extension and reach over.  This when she reaches behind her back she cannot get quite as high as that left hand.  Normal external rotation of the right shoulder.  Strength was 5 out of 5.  Negative empty can test.  She is tender over the distal clavicle and near the [facility] joint she is also tender just lateral to the acromion  Neurological:     Mental Status: She is alert and oriented to person, place, and time.  Psychiatric:        Mood and Affect: Mood normal.        Behavior: Behavior normal.      No results found for any visits on 08/25/23.    The ASCVD Risk score (Arnett DK, et al., 2019) failed to calculate for the following reasons:    Cannot find a previous HDL lab   Cannot find a previous total cholesterol lab    Assessment & Plan:   Problem List Items Addressed This Visit   None Visit Diagnoses       Acute pain of right shoulder    -  Primary   Relevant Medications   meloxicam  (MOBIC ) 15 MG tablet       Right shoulder pain-most consistent with bursitis.  We discussed avoiding heavy lifting.  But that is going to make it difficult for her job where she repeatedly lifts 15 to 20 pound bags of chicken for 8 to 10-hour shifts.  She is going to speak with her nurse at work.  But I did discuss with her that if she continues to do this activity she will not be able to get better.  I did give her handout with exercises to do on her own at home to rehab the bursa and recommend meloxicam  daily to help with pain and inflammation.  No follow-ups on file.    Duaine German, MD

## 2023-09-19 ENCOUNTER — Ambulatory Visit

## 2023-09-19 ENCOUNTER — Other Ambulatory Visit: Payer: Self-pay

## 2023-09-19 ENCOUNTER — Emergency Department (HOSPITAL_BASED_OUTPATIENT_CLINIC_OR_DEPARTMENT_OTHER)

## 2023-09-19 ENCOUNTER — Encounter (HOSPITAL_BASED_OUTPATIENT_CLINIC_OR_DEPARTMENT_OTHER): Payer: Self-pay | Admitting: Urology

## 2023-09-19 ENCOUNTER — Ambulatory Visit
Admission: EM | Admit: 2023-09-19 | Discharge: 2023-09-19 | Disposition: A | Discharge status: Home / Self Care | Attending: Family Medicine | Admitting: Family Medicine

## 2023-09-19 ENCOUNTER — Emergency Department (HOSPITAL_BASED_OUTPATIENT_CLINIC_OR_DEPARTMENT_OTHER)
Admission: EM | Admit: 2023-09-19 | Discharge: 2023-09-19 | Disposition: A | Discharge status: Home / Self Care | Attending: Emergency Medicine | Admitting: Emergency Medicine

## 2023-09-19 DIAGNOSIS — R519 Headache, unspecified: Secondary | ICD-10-CM | POA: Diagnosis present

## 2023-09-19 DIAGNOSIS — W19XXXA Unspecified fall, initial encounter: Secondary | ICD-10-CM

## 2023-09-19 DIAGNOSIS — S0990XA Unspecified injury of head, initial encounter: Secondary | ICD-10-CM | POA: Insufficient documentation

## 2023-09-19 DIAGNOSIS — S161XXA Strain of muscle, fascia and tendon at neck level, initial encounter: Secondary | ICD-10-CM | POA: Diagnosis not present

## 2023-09-19 DIAGNOSIS — W010XXA Fall on same level from slipping, tripping and stumbling without subsequent striking against object, initial encounter: Secondary | ICD-10-CM | POA: Insufficient documentation

## 2023-09-19 DIAGNOSIS — M25522 Pain in left elbow: Secondary | ICD-10-CM

## 2023-09-19 MED ORDER — CELECOXIB 200 MG PO CAPS: 200.0000 mg | ORAL_CAPSULE | Freq: Every day | ORAL | 0 refills | Status: AC

## 2023-09-19 NOTE — ED Triage Notes (Signed)
 Pt c/o fall that occurred earlier today while at work. Says she was walking in the building and slipped, falling backwards hitting her head and LT elbow. Denies LOC. Some tingling in LT hand/fingers. Tylenol  and ibuprofen prn.

## 2023-09-19 NOTE — ED Notes (Signed)
 Pt. Reports fall early today at work and now needs to be checked due to having hit her head and feeling a headache.

## 2023-09-19 NOTE — ED Provider Notes (Signed)
 [facility] Provider Note   CSN: [id number] Arrival date & time: 09/19/23  2022     History {Add pertinent medical, surgical, social history, OB history to HPI:1} Chief Complaint  Patient presents with   Candace Greene is a 53 y.o. female.  She is presenting for evaluation of injuries from a fall.  She said she was rushing to get into work this morning in the rain and slipped and fell striking her head.  No loss of consciousness.  Continues to have significant posterior head pain along with neck pain.  Went to urgent care where they recommended she come here for further evaluation of her head injury.  They did do x-rays of her left elbow that were negative for fracture.  No chest pain or shortness of breath numbness or weakness.  She has a history of pulmonary embolism and was on a blood thinner but she said that was stopped and she is not on one now.  The history is provided by the patient.  Fall This is a new problem. The current episode started 6 to 12 hours ago. The problem has not changed since onset.Associated symptoms include headaches. Pertinent negatives include no chest pain, no abdominal pain and no shortness of breath. Nothing aggravates the symptoms. Nothing relieves the symptoms. She has tried rest for the symptoms. The treatment provided no relief.       Home Medications Prior to Admission medications   Medication Sig Start Date End Date Taking? Authorizing Provider  butalbital -acetaminophen -caffeine  (FIORICET) 50-325-40 MG tablet Take 1-2 tablets by mouth every 6 (six) hours as needed for headache. 04/30/20   Early, Sara E, NP  celecoxib  (CELEBREX ) 200 MG capsule Take 1 capsule (200 mg total) by mouth daily for 15 days. 09/19/23 10/04/23  Leonides Ramp, FNP  cyclobenzaprine  (FLEXERIL ) 10 MG tablet Take 1 tablet (10 mg total) by mouth 3 (three) times daily as needed for muscle spasms. 01/25/22   Breeback, Jade L, PA-C   eletriptan  (RELPAX ) 40 MG tablet Take 1 tablet (40 mg total) by mouth as needed for migraine or headache. May repeat in 2 hours if headache persists or recurs. 09/09/21   Cydney Draft, MD  fluticasone  (FLONASE ) 50 MCG/ACT nasal spray Place 2 sprays into both nostrils daily. 02/19/20   Breeback, Jade L, PA-C      Allergies    Maxalt  [rizatriptan ]    Review of Systems   Review of Systems  Respiratory:  Negative for shortness of breath.   Cardiovascular:  Negative for chest pain.  Gastrointestinal:  Negative for abdominal pain.  Neurological:  Positive for headaches.    Physical Exam Updated Vital Signs BP (!) 156/93 (BP Location: Left Arm)   Pulse 60   Temp 97.9 F (36.6 C)   Resp 20   Ht 5\' 2"  (1.575 m)   Wt 94.3 kg   SpO2 100%   BMI 38.02 kg/m  Physical Exam Vitals and nursing note reviewed.  Constitutional:      General: She is not in acute distress.    Appearance: Normal appearance. She is well-developed.  HENT:     Head: Normocephalic and atraumatic.  Eyes:     Conjunctiva/sclera: Conjunctivae normal.  Neck:     Comments: He has some mild lower cervical spine tenderness Cardiovascular:     Rate and Rhythm: Normal rate and regular rhythm.     Heart sounds: No murmur heard. Pulmonary:  Effort: Pulmonary effort is normal.     Breath sounds: Normal breath sounds.  Abdominal:     Palpations: Abdomen is soft.     Tenderness: There is no abdominal tenderness. There is no guarding or rebound.  Musculoskeletal:        General: Tenderness (left elbow) present. No deformity. Normal range of motion.     Cervical back: Tenderness present.  Skin:    General: Skin is warm and dry.  Neurological:     General: No focal deficit present.     Mental Status: She is alert and oriented to person, place, and time.     GCS: GCS eye subscore is 4. GCS verbal subscore is 5. GCS motor subscore is 6.     Sensory: No sensory deficit.     Motor: No weakness.     ED  Results / Procedures / Treatments   Labs (all labs ordered are listed, but only abnormal results are displayed) Labs Reviewed - No data to display  EKG None  Radiology DG Elbow Complete Left Result Date: 09/19/2023 CLINICAL DATA:  Pain. EXAM: LEFT ELBOW - COMPLETE 4 VIEW COMPARISON:  None Available. FINDINGS: There is no evidence of fracture, dislocation, or joint effusion. There is no evidence of arthropathy or other focal bone abnormality. Soft tissues are unremarkable. IMPRESSION: Negative. Electronically Signed   By: Sydell Eva M.D.   On: 09/19/2023 19:22    Procedures Procedures  {Document cardiac monitor, telemetry assessment procedure when appropriate:1}  Medications Ordered in ED Medications - No data to display  ED Course/ Medical Decision Making/ A&P   {   Click here for ABCD2, HEART and other calculatorsREFRESH Note before signing :1}                              Medical Decision Making Amount and/or Complexity of Data Reviewed Radiology: ordered.   This patient complains of ***; this involves an extensive number of treatment Options and is a complaint that carries with it a high risk of complications and morbidity. The differential includes ***  I ordered, reviewed and interpreted labs, which included *** I ordered medication *** and reviewed PMP when indicated. I ordered imaging studies which included *** and I independently    visualized and interpreted imaging which showed *** Additional history obtained from *** Previous records obtained and reviewed *** I consulted *** and discussed lab and imaging findings and discussed disposition.  Cardiac monitoring reviewed, *** Social determinants considered, *** Critical Interventions: ***  After the interventions stated above, I reevaluated the patient and found *** Admission and further testing considered, ***   {Document critical care time when appropriate:1} {Document review of labs and clinical  decision tools ie heart score, Chads2Vasc2 etc:1}  {Document your independent review of radiology images, and any outside records:1} {Document your discussion with family members, caretakers, and with consultants:1} {Document social determinants of health affecting pt's care:1} {Document your decision making why or why not admission, treatments were needed:1} Final Clinical Impression(s) / ED Diagnoses Final diagnoses:  None    Rx / DC Orders ED Discharge Orders     None

## 2023-09-19 NOTE — Discharge Instructions (Addendum)
 Instructed patient to discontinue Mobic .  Advised patient of left elbow x-ray results with hardcopy provided.  Advised may take Celebrex daily for the next 15 days.  Encouraged to increase daily water intake to 64 ounces per day while taking this medication.  Advised if symptoms worsen and/or unresolved please follow-up with your PCP or here for further evaluation.  Advised patient if headache worsens and/or is accompanied by visual acuity changes please go to [facility] ED for further evaluation of head injury.

## 2023-09-19 NOTE — Discharge Instructions (Signed)
 You were seen in the emergency department for evaluation of injuries from a fall.  You had a CAT scan of your head and cervical spine that did not show any acute traumatic findings.  You can use ice to the affected areas and Tylenol  and ibuprofen as needed for pain.  Follow-up with your primary care doctor.  Return if any worsening or concerning symptoms

## 2023-09-19 NOTE — ED Provider Notes (Signed)
 [name]    CSN: [id number] Arrival date & time: 09/19/23  1840      History   Chief Complaint Chief Complaint  Patient presents with   Fall   Elbow Injury    LT    HPI Candace Greene is a 53 y.o. female.   HPI 53 year old female presents with left elbow pain secondary to fall that occurred earlier this morning at 8 AM while walking into her employment.  PMH significant for obesity, and PE.  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   Moderate ankle sprain, right, subsequent encounter 02/16/2022   DDD (degenerative disc disease), lumbar 01/25/2022   Acute bilateral low back pain without sciatica 01/25/2022   Right lumbar radiculopathy 09/10/2021   Impingement syndrome, shoulder, left 09/10/2021   Primary osteoarthritis of right knee 09/10/2021   Migraine with status migrainosus, not intractable 06/08/2020   Headache disorder 10/07/2019   Slow transit constipation 06/21/2019   Ganglion cyst of dorsum of right wrist 04/01/2019   Intertrigo 04/01/2019   Nontraumatic rupture of extensor tendons of hand and wrist, right 04/01/2019   Carpal tunnel syndrome 05/11/2017   Cubital tunnel syndrome 05/11/2017   Right leg pain 06/27/2016   Fatty liver 11/04/2015   Pica 09/01/2015   DUB (dysfunctional uterine bleeding) 09/01/2015   Iron deficiency anemia 09/01/2015   Menorrhagia with irregular cycle 09/01/2015   Ovarian cyst 06/16/2015   Uterine fibroid 06/16/2015   Elevated lipase 06/15/2015   Abnormal echocardiogram 05/06/2015   History of pulmonary embolism 04/17/2015   Wrist tendonitis 11/28/2014    Past Surgical History:  Procedure Laterality Date   No prior surgery      OB History   No obstetric history on file.      Home Medications    Prior to  Admission medications   Medication Sig Start Date End Date Taking? Authorizing Provider  celecoxib (CELEBREX) 200 MG capsule Take 1 capsule (200 mg total) by mouth daily for 15 days. 09/19/23 10/04/23 Yes Leonides Ramp, FNP  butalbital -acetaminophen -caffeine  (FIORICET) 50-325-40 MG tablet Take 1-2 tablets by mouth every 6 (six) hours as needed for headache. 04/30/20   Early, Sara E, NP  cyclobenzaprine  (FLEXERIL ) 10 MG tablet Take 1 tablet (10 mg total) by mouth 3 (three) times daily as needed for muscle spasms. 01/25/22   Breeback, Jade L, PA-C  eletriptan  (RELPAX ) 40 MG tablet Take 1 tablet (40 mg total) by mouth as needed for migraine or headache. May repeat in 2 hours if headache persists or recurs. 09/09/21   Cydney Draft, MD  fluticasone  (FLONASE ) 50 MCG/ACT nasal spray Place 2 sprays into both nostrils daily. 02/19/20   Araceli Knight, PA-C    Family History Family History  Problem Relation Age of Onset   Stroke Father        <60   Heart attack Father    Birth defects Other    Hyperlipidemia Other    Breast cancer Maternal Grandmother    Breast cancer Maternal Aunt    Cancer Other        GF    Social History Social History   Tobacco Use   Smoking status: Never   Smokeless tobacco: Never  Vaping Use   Vaping status: Never Used  Substance Use Topics   Alcohol use: Yes    Alcohol/week: 0.0 standard drinks of alcohol    Comment: 3 glasses wine per night   Drug use: No     Allergies   Maxalt  [rizatriptan ]   Review of Systems Review of Systems   Physical Exam Triage Vital Signs ED Triage Vitals [09/19/23 1849]  Encounter Vitals Group     BP (!) 148/97     Systolic BP Percentile      Diastolic BP Percentile      Pulse Rate (!) 57     Resp 17     Temp 97.7 F (36.5 C)     Temp Source Oral     SpO2 99 %     Weight      Height      Head Circumference      Peak Flow      Pain Score      Pain Loc      Pain Education      Exclude from Growth Chart     No data found.  Updated Vital Signs BP (!) 148/97 (BP Location: Right Arm)   Pulse (!) 57   Temp 97.7 F (36.5 C) (Oral)   Resp 17   SpO2 99%   Visual Acuity Right Eye Distance:   Left Eye Distance:   Bilateral Distance:    Right Eye Near:   Left Eye Near:    Bilateral Near:     Physical Exam Vitals and nursing note reviewed.  Constitutional:      Appearance: Normal appearance. She is normal weight.  HENT:     Head: Normocephalic and atraumatic.     Mouth/Throat:     Mouth: Mucous membranes are moist.     Pharynx: Oropharynx is clear.  Eyes:     Extraocular Movements: Extraocular movements intact.     Conjunctiva/sclera: Conjunctivae normal.     Pupils: Pupils are equal, round, and reactive to light.  Cardiovascular:     Rate and Rhythm: Normal rate and regular rhythm.     Pulses: Normal pulses.     Heart sounds: Normal heart sounds.  Pulmonary:     Effort: Pulmonary effort is normal.     Breath sounds: Normal breath sounds. No wheezing, rhonchi or rales.  Musculoskeletal:        General: Normal range of motion.     Cervical back: Normal range of motion and neck supple.     Comments: Left elbow: TTP over lateral epicondyle, no soft tissue swelling noted, limited range of motion with flexion and extension  Skin:    General: Skin is warm and dry.  Neurological:     General: No focal deficit present.     Mental Status: She is alert and oriented to person, place, and time. Mental status is at baseline.  Psychiatric:        Mood and Affect: Mood normal.        Behavior: Behavior normal.      UC Treatments / Results  Labs (all labs ordered are listed, but only abnormal results are displayed) Labs Reviewed - No data to display  EKG   Radiology DG Elbow Complete Left Result Date: 09/19/2023 CLINICAL DATA:  Pain. EXAM: LEFT ELBOW - COMPLETE 4 VIEW COMPARISON:  None Available. FINDINGS: There is no evidence of fracture, dislocation, or joint effusion. There  is no evidence of arthropathy or other focal bone abnormality. Soft tissues are unremarkable. IMPRESSION: Negative. Electronically Signed  By: Sydell Eva M.D.   On: 09/19/2023 19:22    Procedures Procedures (including critical care time)  Medications Ordered in UC Medications - No data to display  Initial Impression / Assessment and Plan / UC Course  I have reviewed the triage vital signs and the nursing notes.  Pertinent labs & imaging results that were available during my care of the patient were reviewed by me and considered in my medical decision making (see chart for details).     MDM: 1.  Injury of head, initial encounter-Advised patient if headache worsens and/or is accompanied by visual acuity changes please go to [facility] ED for further evaluation of head injury.  Left elbow pain-left elbow x-ray results revealed above, Rx'd Celebrex 200 mg capsule: Take 1 capsule daily x 15 days.  Fall, initial encounter-patient reports falling due to slipping in water prior to going into work this morning. Instructed patient to discontinue Mobic .  Advised patient of left elbow x-ray results with hardcopy provided.  Advised may take Celebrex daily for the next 15 days.  Encouraged to increase daily water intake to 64 ounces per day while taking this medication.  Advised if symptoms worsen and/or unresolved please follow-up with your PCP or here for further evaluation.  Patient discharged home, hemodynamically stable. Final Clinical Impressions(s) / UC Diagnoses   Final diagnoses:  Left elbow pain  Fall, initial encounter  Injury of head, initial encounter     Discharge Instructions      Instructed patient to discontinue Mobic .  Advised patient of left elbow x-ray results with hardcopy provided.  Advised may take Celebrex daily for the next 15 days.  Encouraged to increase daily water intake to 64 ounces per day while taking this medication.  Advised if symptoms  worsen and/or unresolved please follow-up with your PCP or here for further evaluation.  Advised patient if headache worsens and/or is accompanied by visual acuity changes please go to [facility] ED for further evaluation of head injury.     ED Prescriptions     Medication Sig Dispense Auth. Provider   celecoxib (CELEBREX) 200 MG capsule Take 1 capsule (200 mg total) by mouth daily for 15 days. 15 capsule Duanna Runk, FNP      PDMP not reviewed this encounter.   Leonides Ramp, FNP 09/19/23 1942

## 2023-09-19 NOTE — ED Triage Notes (Signed)
 Pt states UC sent here after fall at work for CT head  States hit head and elbow while at work today  Denies LOC at time of head injury States pain in head and neck at this time

## 2023-09-25 ENCOUNTER — Ambulatory Visit: Payer: Self-pay

## 2023-09-25 NOTE — Telephone Encounter (Signed)
 Copied from CRM [id number]. Topic: Clinical - Red Word Triage >> Sep 25, 2023  1:42 PM Eleanore Grey wrote: Red Word that prompted transfer to Nurse Triage: Patient fell coming into work on Tuesday, having a lot of pain in elbow, cannot apply pressure. Also having really bad headaches. Patient did hit her head when she fell.   Chief Complaint: Elbow pain post fall Symptoms: Left elbow pain, headache  Frequency: Constant  Disposition: [] ED /[] Urgent Care (no appt availability in office) / [x] Appointment(In office/virtual)/ []  [facility]/ [] Home Care/ [] Refused Recommended Disposition /[] [facility]/ []  Follow-up with PCP Additional Notes: Patient reports she fell last week and was seen in the ED. She states that she injured her left elbow and hit her head during the fall, and has had continued pain in her elbow and headache since that time. Hospital follow-up appointment made for 10/02/23. Patient instructed to call back for new or worsening symptoms. Patient verbalized understanding and agreement with this plan.     Reason for Disposition  [1] MODERATE pain (e.g., interferes with normal activities) AND [2] present > 3 days    Appointment 10/02/23. Patient seen last week in ED for same.  Answer Assessment - Initial Assessment Questions 1. ONSET: "When did the pain start?"     09/19/23 2. LOCATION: "Where is the pain located?"     Left elbow  3. PAIN: "How bad is the pain?" (Scale 1-10; or mild, moderate, severe)   - MILD (1-3): doesn't interfere with normal activities.   - MODERATE (4-7): interferes with normal activities (e.g., work or school) or awakens from sleep.   - SEVERE (8-10): excruciating pain, unable to do any normal activities, unable to use arm at all.     9/10 4. WORK OR EXERCISE: "Has there been any recent work or exercise that involved this part of the body?"     No 5. CAUSE: "What do you think is causing the elbow pain?"     Injury from fall 6. OTHER SYMPTOMS:  "Do you have any other symptoms?" (e.g., neck pain, elbow swelling, rash, fever)     Headache  Protocols used: Elbow Pain-A-AH

## 2023-09-25 NOTE — Telephone Encounter (Signed)
 See if we can get her in this week. Ok to double book me if needed.

## 2023-09-27 NOTE — Telephone Encounter (Signed)
 Left message for patient to call back

## 2023-09-29 NOTE — Telephone Encounter (Signed)
 Patient has upcoming appt scheduled for 10/02/23 with Dr. Greer Leak

## 2023-10-02 ENCOUNTER — Encounter: Payer: Self-pay | Admitting: Family Medicine

## 2023-10-02 ENCOUNTER — Ambulatory Visit (INDEPENDENT_AMBULATORY_CARE_PROVIDER_SITE_OTHER): Admitting: Family Medicine

## 2023-10-02 VITALS — BP 120/59 | HR 79 | Ht 62.0 in | Wt 207.9 lb

## 2023-10-02 DIAGNOSIS — R21 Rash and other nonspecific skin eruption: Secondary | ICD-10-CM | POA: Diagnosis not present

## 2023-10-02 DIAGNOSIS — M25522 Pain in left elbow: Secondary | ICD-10-CM

## 2023-10-02 DIAGNOSIS — S060X0A Concussion without loss of consciousness, initial encounter: Secondary | ICD-10-CM | POA: Diagnosis not present

## 2023-10-02 DIAGNOSIS — W010XXA Fall on same level from slipping, tripping and stumbling without subsequent striking against object, initial encounter: Secondary | ICD-10-CM | POA: Diagnosis not present

## 2023-10-02 DIAGNOSIS — S0990XA Unspecified injury of head, initial encounter: Secondary | ICD-10-CM

## 2023-10-02 MED ORDER — CLOBETASOL PROPIONATE 0.05 % EX CREA: 1.0000 | TOPICAL_CREAM | Freq: Two times a day (BID) | CUTANEOUS | 0 refills | Status: AC

## 2023-10-02 NOTE — Progress Notes (Signed)
 Established Patient Office Visit  Subjective  Patient ID: Candace Greene, female    DOB: [birth year]  Age: 53 y.o. MRN: [id number]  Chief Complaint  Patient presents with   Follow-up    Fall 09/19/23 pt fell walking into work she fell backwards and hit the back of her head and L elbow. She is experiencing headaches and her elbow is swollen and painful to the touch.      HPI   Fall 09/19/23 pt fell walking into work she fell backwards and hit the back of her head and L elbow as she was walking in the door. She says the onsite nurse did check her over and she went to her work shift but had a lot of pain in her head and in her elbow during the work shift so afterwards she saw help at urgent care.. She is experiencing headaches and her elbow is swollen and painful to the touch. Went to UC on 5/27 for her left elbow underwent an x-ray, negative.  She was given a prescription for Celebrex  to take in place of meloxicam .  And then went to ED same day to be seen for the head injury since she did hit her head during the fall.  Head CT was negative no sign of fracture or bleed.  CT cervical spine was also negative.   Since hitting her head she has continued to experience headaches, is Enis, intermittent blurry vision, difficulty concentrating.  Is been using Tylenol  and ibuprofen together to get pain relief from her headaches.  Also at work they have been wrapping her elbow.  But she noticed that it has been starting to get really itchy she noticed some bruising and some red bumps where the wrapping was placed.  She is also noticed some red itchy bumps on her left side of her abdomen close to where her elbow would touch.    ROS    Objective:     BP (!) 120/59   Pulse 79   Ht 5\' 2"  (1.575 m)   Wt 207 lb 14.3 oz (94.3 kg)   SpO2 98%   BMI 38.02 kg/m    Physical Exam   No results found for any visits on 10/02/23.    The ASCVD Risk score (Arnett DK, et al., 2019) failed to calculate for  the following reasons:   Cannot find a previous HDL lab   Cannot find a previous total cholesterol lab    Assessment & Plan:   Problem List Items Addressed This Visit       Nervous and Auditory   Concussion with no loss of consciousness     Musculoskeletal and Integument   Rash - Primary   I believe she is actually having an allergic reaction possibly to the go to the Coban which sounds like the type of dressing that is being used at work.  Encouraged her to just use a regular Ace wrap.  I will send over a topical steroid cream to use on the Rashi itchy areas.  If not improving over this next week please let me know.      Other Visit Diagnoses       Left elbow pain       Relevant Orders   DG Elbow Complete Left     Injury of head, initial encounter         Fall on same level from slipping, initial encounter       Relevant Orders   DG Elbow  Complete Left      Left elbow pain-she is tender over the tip of the elbow and just laterally.  We discussed that she there may be some element of bursitis from the trauma x-ray was negative.  I want her to stay home this week and allow it to rest since she does do a lot of repetitive motion at work.  If not improving by the end of the week please let me know and we will get a plain film just to rule out smaller fracture that could have been missed initially.  Head injury with no loss of consciousness-she is having persistent symptoms over a week later including headaches dizziness blurry vision and difficulty focusing.  Symptoms most consistent with concussion I am going to write her out of work this week so she can have some time to rest and recover okay to do some light housework no heavy lifting no repetitive movement keep screen time to a minimum stop doing any activity that is actually causing or aggravating the headache.  Again if not feeling better by the end of the week please let me know.   No follow-ups on file.    Duaine German, MD

## 2023-10-02 NOTE — Assessment & Plan Note (Signed)
 I believe she is actually having an allergic reaction possibly to the go to the Coban which sounds like the type of dressing that is being used at work.  Encouraged her to just use a regular Ace wrap.  I will send over a topical steroid cream to use on the Rashi itchy areas.  If not improving over this next week please let me know.

## 2023-10-12 ENCOUNTER — Other Ambulatory Visit: Payer: Self-pay | Admitting: *Deleted

## 2023-10-12 DIAGNOSIS — R21 Rash and other nonspecific skin eruption: Secondary | ICD-10-CM

## 2023-10-12 MED ORDER — NYSTATIN 100000 UNIT/GM EX POWD: 1.0000 | Freq: Three times a day (TID) | CUTANEOUS | 2 refills | Status: DC

## 2023-10-20 ENCOUNTER — Encounter: Admitting: Family Medicine

## 2023-10-24 ENCOUNTER — Telehealth: Payer: Self-pay | Admitting: Family Medicine

## 2023-10-24 NOTE — Telephone Encounter (Signed)
 Copied from CRM [id number]. Topic: General - Other >> Oct 24, 2023 11:46 AM Diannia H wrote: Reason for CRM: Patient is requesting a callback she has some questions and is needing some papers faxed, patients callback number is [phone]. Patient also wanted to speak to someone at the front desk she said it was about making an appointment and I explained to her that I could make her an appointment, she didn't want me to, and in the mist of me explaining that I will send a message and have someone call her back she hung up.

## 2023-10-25 NOTE — Telephone Encounter (Signed)
 Pt called back and provided the fax# of [phone]. She stated the paperwork is in reference to urgent care and her follow up for when she fell. Please follow up.

## 2023-11-01 NOTE — Telephone Encounter (Signed)
 I have faxed the requested notes per the patient to [employer] via e-fax. She requested that we send notes from 09/19/23 and 10/02/23 to [facility].  Copied from CRM [id number]. Topic: General - Other >> Oct 24, 2023 11:46 AM Diannia H wrote: Reason for CRM: Patient is requesting a callback she has some questions and is needing some papers faxed, patients callback number is [phone]. Patient also wanted to speak to someone at the front desk she said it was about making an appointment and I explained to her that I could make her an appointment, she didn't want me to, and in the mist of me explaining that I will send a message and have someone call her back she hung up. >> Nov 01, 2023  9:03 AM Alfonso ORN wrote: Patient calling on status of getting paperwork faxed regarding going to urgent care , had patient on hold when came back , patient not responding  Please contact patient  Patient call back number [phone]

## 2023-12-18 ENCOUNTER — Encounter: Payer: Self-pay | Admitting: Family Medicine

## 2023-12-18 ENCOUNTER — Ambulatory Visit (INDEPENDENT_AMBULATORY_CARE_PROVIDER_SITE_OTHER): Admitting: Family Medicine

## 2023-12-18 ENCOUNTER — Ambulatory Visit: Payer: Self-pay

## 2023-12-18 VITALS — BP 128/66 | HR 60 | Ht 62.0 in | Wt 210.0 lb

## 2023-12-18 DIAGNOSIS — Z23 Encounter for immunization: Secondary | ICD-10-CM

## 2023-12-18 DIAGNOSIS — L509 Urticaria, unspecified: Secondary | ICD-10-CM

## 2023-12-18 DIAGNOSIS — T753XXD Motion sickness, subsequent encounter: Secondary | ICD-10-CM

## 2023-12-18 DIAGNOSIS — R21 Rash and other nonspecific skin eruption: Secondary | ICD-10-CM | POA: Diagnosis not present

## 2023-12-18 MED ORDER — NYSTATIN 100000 UNIT/GM EX POWD: 1.0000 | Freq: Three times a day (TID) | CUTANEOUS | 2 refills | Status: DC

## 2023-12-18 MED ORDER — PREDNISONE 20 MG PO TABS: 40.0000 mg | ORAL_TABLET | Freq: Every day | ORAL | 0 refills | Status: DC

## 2023-12-18 NOTE — Telephone Encounter (Signed)
 FYI Only or Action Required?: FYI only for provider.  Patient was last seen in primary care on 10/02/2023 by Alvan Dorothyann BIRCH, MD.  Called Nurse Triage reporting Rash.  Symptoms began several days ago.  Interventions attempted: Other: anti-itch ointment, epsom salt, alcohol on site, Allegra, Zyrtec, Oatmeal bath - all provided no relief.  Symptoms are: gradually worsening.  Triage Disposition: See Physician Within 24 Hours  Patient/caregiver understands and will follow disposition?: Yes      Copied from CRM #[id number]. Topic: Clinical - Red Word Triage >> Dec 18, 2023  7:33 AM Laurier BROCKS wrote: Red Word that prompted transfer to Nurse Triage: Patient states she broke out in a rash starting on Saturday that started in her breast and has traveled down to her stomach, hands and arms. Patient believes she may have had an allergic reaction to a drink she had. She states it started out as welts and its itching. Reason for Disposition  SEVERE itching (i.e., interferes with sleep, normal activities or school)  Answer Assessment - Initial Assessment Questions Denies swelling in mouth/tongue/throat, fever. Patient states when she gets hot she itches more.  1. APPEARANCE of RASH: What does the rash look like? (e.g., blisters, dry flaky skin, red spots, redness, sores)     At first it was skin tone with welts on chest 2. SIZE: How big are the spots? (e.g., tip of pen, eraser, coin; inches, centimeters)     Patient states the bumps are bigger 3. LOCATION: Where is the rash located?     Began on breasts and spread down to stomach, hands, arm, legs, hips 4. COLOR: What color is the rash? (Note: It is difficult to assess rash color in people with darker-colored skin. When this situation occurs, simply ask the caller to describe what they see.)     Red  5. ONSET: When did the rash begin?     Saturday 6. FEVER: Do you have a fever? If Yes, ask: What is your temperature, how was it  measured, and when did it start?     No 7. ITCHING: Does the rash itch? If Yes, ask: How bad is the itch? (Scale 1-10; or mild, moderate, severe)     10 8. CAUSE: What do you think is causing the rash?     Patient states the only thing she knows she did different was having a specific cocktail drink  9. MEDICINE FACTORS: Have you started any new medicines within the last 2 weeks? (e.g., antibiotics)      No 10. OTHER SYMPTOMS: Do you have any other symptoms? (e.g., dizziness, headache, sore throat, joint pain)       No  Protocols used: Rash or Redness - [facility]

## 2023-12-18 NOTE — Progress Notes (Signed)
 Established Patient Office Visit  Subjective  Patient ID: Candace Greene, female    DOB: 1971/03/28  Age: 53 y.o. MRN: [id number]  Chief Complaint  Patient presents with   Rash    HPI  Discussed the use of AI scribe software for clinical note transcription with the patient, who gave verbal consent to proceed.  History of Present Illness Candace Greene is a 53 year old female who presents with an allergic reaction characterized by widespread hives and itching.  Allergic reaction and cutaneous symptoms - Onset of widespread hives and pruritus after consuming a drink called 'Cut Water' for the first time - Hives and welts present on back, hips, chest, and wrists - Localized swelling, particularly of the right index finger - Severe pruritus causing rubbing through clothes and inability to sleep - No prior issues with lime juice, lemon juice, [employer], or sweet tea, all of which were also consumed on the same day - Consumed hot wings from that night [place], which she has eaten before - No respiratory symptoms or breathing difficulties during the reaction  Suspected allergen exposure - 'Cut Water' drink is suspected as the trigger; possible coconut content, but not confirmed - Regularly consumes pineapples and mangoes without adverse reactions - No known allergy to coconut, but not previously confirmed as an allergen  Therapeutic interventions and response - Took Benadryl for symptom relief, but found it insufficient - Previous similar reactions managed effectively with prednisone  50 mg daily for five days  Associated symptoms - Headache occurred the day after consuming the drink - No chest pain, shortness of breath, or palpitations  She will be going on a 5 day cruise. Thinks still has patches at home but not sure if expired.       ROS    Objective:     BP 128/66   Pulse 60   Ht 5' 2 (1.575 m)   Wt 210 lb (95.3 kg)   SpO2 96%   BMI 38.41 kg/m     Physical Exam Vitals reviewed.  Constitutional:      Appearance: Normal appearance.  HENT:     Head: Normocephalic.  Pulmonary:     Effort: Pulmonary effort is normal.  Skin:    Comments: Erythematous macules and papules on her chest, upper abdomen, and lower legs.   Neurological:     Mental Status: She is alert and oriented to person, place, and time.  Psychiatric:        Mood and Affect: Mood normal.        Behavior: Behavior normal.      No results found for any visits on 12/18/23.    The ASCVD Risk score (Arnett DK, et al., 2019) failed to calculate for the following reasons:   Cannot find a previous HDL lab   Cannot find a previous total cholesterol lab    Assessment & Plan:   Problem List Items Addressed This Visit       Musculoskeletal and Integument   Rash   Relevant Medications   nystatin  (MYCOSTATIN /NYSTOP ) powder   Other Visit Diagnoses       Encounter for immunization    -  Primary   Relevant Orders   Tdap vaccine greater than or equal to 7yo IM (Completed)   Pneumococcal conjugate vaccine 20-valent (Completed)     Motion sickness, subsequent encounter         Hives           Assessment and  Plan Assessment & Plan Acute generalized urticarial rash due to possible allergic reaction Acute urticarial rash likely from an allergic reaction, possibly to a new drink (Cutwater). No respiratory symptoms. Previous tolerance to tequila and margaritas suggests a specific ingredient in Cutwater as the allergen. - Prescribe prednisone  40 mg daily for 5 days. - Advise use of soothing creams for relief. - Instruct to avoid Cutwater drink. - Discuss use of ice packs and showers for relief.  General Health Maintenance Due for tetanus and pneumonia vaccinations. Discussed deferring until rash resolves, but can be given now if desired. - Administer tetanus and pneumonia vaccinations at her discretion.  Motion sickness-if her patches are expired I am happy  to send in a new prescription she can also try meclizine which is nondrowsy Dramamine.  Tdap and pneumonia vaccines administered today.    Return if symptoms worsen or fail to improve.    Dorothyann Byars, MD

## 2023-12-28 ENCOUNTER — Encounter: Payer: Self-pay | Admitting: Sports Medicine

## 2024-01-01 ENCOUNTER — Ambulatory Visit: Payer: Self-pay

## 2024-01-01 DIAGNOSIS — Z20818 Contact with and (suspected) exposure to other bacterial communicable diseases: Secondary | ICD-10-CM

## 2024-01-01 NOTE — Telephone Encounter (Signed)
 FYI Only or Action Required?: Action required by provider: clinical question for provider.  Patient was last seen in primary care on 12/18/2023 by Alvan Dorothyann BIRCH, MD.  Called Nurse Triage reporting PCP Question. Triage Disposition: Discuss With PCP and Callback by Nurse Today  Patient/caregiver understands and will follow disposition?:  Answer Assessment - Initial Assessment Questions 1. REASON FOR CALL or QUESTION: What is your reason for calling today? or How can I best      Patient states husband has H. Pylori and was told to call to request testing for herself. Patient denies any symptoms.  Protocols used: PCP Call - No Triage-A-AH Copied from CRM [id number]. Topic: Clinical - Red Word Triage >> Jan 01, 2024  4:12 PM Merlynn A wrote: [facility] that prompted transfer to Nurse Triage: Testing

## 2024-01-02 NOTE — Telephone Encounter (Signed)
 What are her sxs? Is she taking anything for reflux right now.

## 2024-01-03 NOTE — Telephone Encounter (Signed)
 Left message for a return call

## 2024-01-08 NOTE — Telephone Encounter (Signed)
 Attempted call to patient. Left a voice mail message requesting a return call.

## 2024-01-10 ENCOUNTER — Ambulatory Visit
Admission: EM | Admit: 2024-01-10 | Discharge: 2024-01-10 | Disposition: A | Discharge status: Home / Self Care | Attending: Family Medicine | Admitting: Family Medicine

## 2024-01-10 ENCOUNTER — Other Ambulatory Visit: Payer: Self-pay

## 2024-01-10 DIAGNOSIS — B9689 Other specified bacterial agents as the cause of diseases classified elsewhere: Secondary | ICD-10-CM

## 2024-01-10 DIAGNOSIS — J019 Acute sinusitis, unspecified: Secondary | ICD-10-CM | POA: Diagnosis not present

## 2024-01-10 LAB — POC SARS CORONAVIRUS 2 AG -  ED: SARS Coronavirus 2 Ag: NEGATIVE

## 2024-01-10 MED ORDER — PREDNISONE 20 MG PO TABS: 40.0000 mg | ORAL_TABLET | Freq: Every day | ORAL | 0 refills | Status: DC

## 2024-01-10 MED ORDER — AMOXICILLIN-POT CLAVULANATE 875-125 MG PO TABS: 1.0000 | ORAL_TABLET | Freq: Two times a day (BID) | ORAL | 0 refills | Status: DC

## 2024-01-10 MED ORDER — METHYLPREDNISOLONE ACETATE 80 MG/ML IJ SUSP
80.0000 mg | Freq: Once | INTRAMUSCULAR | Status: AC
  Administered 2024-01-10: 80 mg via INTRAMUSCULAR

## 2024-01-10 NOTE — ED Provider Notes (Addendum)
 [facility]    CSN: [id number] Arrival date & time: 01/10/24  1917      History   Chief Complaint Chief Complaint  Patient presents with   Headache   Facial Pain    HPI Candace Greene is a 53 y.o. female.   HPI Patient feels prone to sinus infections.  Currently has some severe sinus congestion, pressure and pain in her face, swelling in her nose, swelling around her eyes.  She states if she leans over her face is very heavy.  She has thick mucus from her sinuses.  Postnasal drip.  Sore throat.  Fatigue.  Mild cough.  States she usually needs antibiotics to get better.  Feels she has gotten worse over this last week.  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   Moderate ankle sprain, right, subsequent encounter 02/16/2022   DDD (degenerative disc disease), lumbar 01/25/2022   Acute bilateral low back pain without sciatica 01/25/2022   Right lumbar radiculopathy 09/10/2021   Impingement syndrome, shoulder, left 09/10/2021   Primary osteoarthritis of right knee 09/10/2021   Migraine with status migrainosus, not intractable 06/08/2020   Headache disorder 10/07/2019   Slow transit constipation 06/21/2019   Ganglion cyst of dorsum of right wrist 04/01/2019   Intertrigo 04/01/2019   Nontraumatic rupture of extensor tendons of hand and wrist, right 04/01/2019   Carpal tunnel syndrome 05/11/2017   Cubital tunnel syndrome 05/11/2017   Right leg pain 06/27/2016   Fatty liver 11/04/2015   Pica 09/01/2015   DUB (dysfunctional uterine bleeding) 09/01/2015   Iron deficiency anemia 09/01/2015   Menorrhagia with irregular cycle 09/01/2015   Ovarian cyst 06/16/2015   Uterine fibroid 06/16/2015    Elevated lipase 06/15/2015   Abnormal echocardiogram 05/06/2015   History of pulmonary embolism 04/17/2015   Wrist tendonitis 11/28/2014    Past Surgical History:  Procedure Laterality Date   No prior surgery      OB History   No obstetric history on file.      Home Medications    Prior to Admission medications   Medication Sig Start Date End Date Taking? Authorizing Provider  amoxicillin -clavulanate (AUGMENTIN ) 875-125 MG tablet Take 1 tablet by mouth every 12 (twelve) hours. 01/10/24  Yes Maranda Jamee Jacob, MD  predniSONE  (DELTASONE ) 20 MG tablet Take 2 tablets (40 mg total) by mouth daily with breakfast. 01/10/24  Yes Maranda Jamee Jacob, MD  butalbital -acetaminophen -caffeine  (FIORICET) 50-325-40 MG tablet Take 1-2 tablets by mouth every 6 (six) hours as needed for headache. 04/30/20   Early, Sara E, NP  clobetasol  cream (TEMOVATE ) 0.05 % Apply 1 Application topically 2 (two) times daily. 10/02/23   Alvan Dorothyann BIRCH, MD  cyclobenzaprine  (FLEXERIL ) 10 MG tablet Take 1 tablet (10 mg total) by mouth 3 (three) times daily as needed for muscle spasms. 01/25/22   Breeback, Jade L, PA-C  eletriptan  (RELPAX ) 40 MG tablet Take 1 tablet (40 mg total) by mouth as needed for migraine or headache. May repeat in 2 hours if headache persists or recurs. 09/09/21   Alvan Dorothyann BIRCH, MD  fluticasone  (FLONASE ) 50 MCG/ACT nasal spray Place 2 sprays into both nostrils daily. 02/19/20   Breeback, Jade L,  PA-C  nystatin  (MYCOSTATIN /NYSTOP ) powder Apply 1 Application topically 3 (three) times daily. X 7 days and PRN 12/18/23   Alvan Dorothyann BIRCH, MD    Family History Family History  Problem Relation Age of Onset   Stroke Father        <60   Heart attack Father    Birth defects Other    Hyperlipidemia Other    Breast cancer Maternal Grandmother    Breast cancer Maternal Aunt    Cancer Other        GF    Social History Social History   Tobacco Use   Smoking status: Never   Smokeless  tobacco: Never  Vaping Use   Vaping status: Never Used  Substance Use Topics   Alcohol use: Not Currently   Drug use: No     Allergies   Maxalt  [rizatriptan ]   Review of Systems Review of Systems  See HPI Physical Exam Triage Vital Signs ED Triage Vitals  Encounter Vitals Group     BP 01/10/24 1927 (!) 158/91     Girls Systolic BP Percentile --      Girls Diastolic BP Percentile --      Boys Systolic BP Percentile --      Boys Diastolic BP Percentile --      Pulse Rate 01/10/24 1927 65     Resp 01/10/24 1927 16     Temp 01/10/24 1927 98.1 F (36.7 C)     Temp src --      SpO2 01/10/24 1927 97 %     Weight --      Height --      Head Circumference --      Peak Flow --      Pain Score 01/10/24 1930 9     Pain Loc --      Pain Education --      Exclude from Growth Chart --    No data found.  Updated Vital Signs BP (!) 158/91   Pulse 65   Temp 98.1 F (36.7 C)   Resp 16   SpO2 97%   :     Physical Exam Constitutional:      General: She is not in acute distress.    Appearance: She is well-developed. She is ill-appearing.  HENT:     Head: Normocephalic and atraumatic.     Right Ear: Tympanic membrane normal.     Ears:     Comments: Left TM is dull and injected    Nose: Congestion and rhinorrhea present.     Comments: Nasal membranes are grossly swollen shut.  Erythematous.  Yellow crusting    Mouth/Throat:     Mouth: Mucous membranes are moist.     Pharynx: Posterior oropharyngeal erythema present.  Eyes:     Conjunctiva/sclera: Conjunctivae normal.     Pupils: Pupils are equal, round, and reactive to light.  Cardiovascular:     Rate and Rhythm: Normal rate and regular rhythm.     Heart sounds: Normal heart sounds.  Pulmonary:     Effort: Pulmonary effort is normal. No respiratory distress.     Breath sounds: Normal breath sounds.  Abdominal:     General: There is no distension.     Palpations: Abdomen is soft.  Musculoskeletal:         General: Normal range of motion.     Cervical back: Normal range of motion.  Lymphadenopathy:     Cervical: Cervical adenopathy present.  Skin:  General: Skin is warm and dry.  Neurological:     Mental Status: She is alert.      UC Treatments / Results  Labs (all labs ordered are listed, but only abnormal results are displayed) Labs Reviewed  POC SARS CORONAVIRUS 2 AG -  ED    EKG   Radiology No results found.  Procedures Procedures (including critical care time)  Medications Ordered in UC Medications  methylPREDNISolone  acetate (DEPO-MEDROL ) injection 80 mg (80 mg Intramuscular Given 01/10/24 1953)    Initial Impression / Assessment and Plan / UC Course  I have reviewed the triage vital signs and the nursing notes.  Pertinent labs & imaging results that were available during my care of the patient were reviewed by me and considered in my medical decision making (see chart for details).     Final Clinical Impressions(s) / UC Diagnoses   Final diagnoses:  Acute bacterial sinusitis     Discharge Instructions      Take the Augmentin  antibiotic 2 times a day.  Start the Augmentin  tonight.  Make sure you take this medicine with food Starting tomorrow take prednisone  once a day for 5 days with your morning Augmentin  Make sure you are drinking lots of water Stay home tomorrow and rest See your doctor if not improving by next week     ED Prescriptions     Medication Sig Dispense Auth. Provider   amoxicillin -clavulanate (AUGMENTIN ) 875-125 MG tablet Take 1 tablet by mouth every 12 (twelve) hours. 14 tablet Maranda Jamee Jacob, MD   predniSONE  (DELTASONE ) 20 MG tablet Take 2 tablets (40 mg total) by mouth daily with breakfast. 10 tablet Maranda Jamee Jacob, MD      PDMP not reviewed this encounter.   Maranda Jamee Jacob, MD 01/10/24 DESMA    Maranda Jamee Jacob, MD 01/10/24 [phone]

## 2024-01-10 NOTE — Discharge Instructions (Signed)
 Take the Augmentin  antibiotic 2 times a day.  Start the Augmentin  tonight.  Make sure you take this medicine with food Starting tomorrow take prednisone  once a day for 5 days with your morning Augmentin  Make sure you are drinking lots of water Stay home tomorrow and rest See your doctor if not improving by next week

## 2024-01-10 NOTE — ED Triage Notes (Addendum)
 Has c/o sneezing, ha, sinus pressure since Friday. No sore throat. Has been taking allegra. Has been taking tylenol  as well.

## 2024-01-10 NOTE — Telephone Encounter (Signed)
 Spoke with pt, she reports having sinus and allergy type symptoms for which she is taking allergy medication. She denies any reflux or indigestion type symptoms. Should pt be tested for H. Pylori?

## 2024-01-10 NOTE — Telephone Encounter (Signed)
 Again attempted call to patient.left a voice mail message requesting a return call.

## 2024-01-11 NOTE — Telephone Encounter (Signed)
About a week

## 2024-01-11 NOTE — Telephone Encounter (Signed)
 Patient informed that test ordered and instructions and states she went to urgent care last night - sinus infection- gave two abx  How long will she need to be off of abx before having testing done ? ==kph

## 2024-01-11 NOTE — Addendum Note (Signed)
 Addended by: Toivo Bordon D on: 01/11/2024 12:46 PM   Modules accepted: Orders

## 2024-01-11 NOTE — Telephone Encounter (Signed)
 Test ordered. She can't have taken any reflux meds like Prilosec or Nexium for 2 weeks before she comes in to do the breath test.  She cannot have eaten within a couple hours of doing the test or chew gum.

## 2024-01-12 NOTE — Telephone Encounter (Signed)
 LM with Dr. Claudia recommendation of waiting 1 week in between before testing.

## 2024-05-02 ENCOUNTER — Encounter: Payer: Self-pay | Admitting: Family Medicine

## 2024-05-02 ENCOUNTER — Ambulatory Visit (INDEPENDENT_AMBULATORY_CARE_PROVIDER_SITE_OTHER): Admitting: Family Medicine

## 2024-05-02 VITALS — BP 121/60 | HR 62 | Temp 98.1°F | Ht 62.0 in | Wt 208.0 lb

## 2024-05-02 DIAGNOSIS — R21 Rash and other nonspecific skin eruption: Secondary | ICD-10-CM | POA: Diagnosis not present

## 2024-05-02 DIAGNOSIS — R059 Cough, unspecified: Secondary | ICD-10-CM | POA: Diagnosis not present

## 2024-05-02 DIAGNOSIS — J0110 Acute frontal sinusitis, unspecified: Secondary | ICD-10-CM | POA: Diagnosis not present

## 2024-05-02 LAB — POC SOFIA 2 FLU + SARS ANTIGEN FIA
Influenza A, POC: NEGATIVE
Influenza B, POC: NEGATIVE
SARS Coronavirus 2 Ag: NEGATIVE

## 2024-05-02 MED ORDER — AMOXICILLIN-POT CLAVULANATE 875-125 MG PO TABS: 1.0000 | ORAL_TABLET | Freq: Two times a day (BID) | ORAL | 0 refills | Status: AC

## 2024-05-02 MED ORDER — NYSTATIN 100000 UNIT/GM EX POWD: 1.0000 | Freq: Three times a day (TID) | CUTANEOUS | 2 refills | Status: AC

## 2024-05-02 NOTE — Progress Notes (Signed)
 "  Acute Office Visit  Patient ID: Candace Greene, female    DOB: [birth year], 54 y.o.   MRN: [id number]  PCP: Alvan Dorothyann BIRCH, MD  Chief Complaint  Patient presents with   Cough    Subjective:     HPI  Discussed the use of AI scribe software for clinical note transcription with the patient, who gave verbal consent to proceed.  History of Present Illness Candace Greene is a 54 year old female who presents with persistent cough and congestion.  Upper respiratory symptoms - Persistent cough for approximately 1.5 weeks, with severe coughing fits sometimes leading to vomiting - Congestion present throughout the illness - Initial sore throat followed by headache - Difficulty sleeping due to coughing and cold chills - Wakes up in the middle of the night due to coughing and difficulty breathing - Pressure in the chest - No significant improvement with Nyquil, only slight day-to-day variation in symptoms  Systemic and constitutional symptoms - Fever present - Alternating sensations of feeling hot and cold - Cold chills - Muscle pain when laughing - Back pain - Tightness in the shoulder area  Dermatologic symptoms - Rash under the breasts, sometimes itchy - No increase in physical activity or sweating - started recently.   Epidemiologic factors - Family members were ill over the holidays - Received the flu shot prior to illness   ROS     Objective:    BP 121/60   Pulse 62   Temp 98.1 F (36.7 C) (Oral)   Ht 5' 2 (1.575 m)   Wt 208 lb (94.3 kg)   SpO2 96%   BMI 38.04 kg/m    Physical Exam Constitutional:      Appearance: Normal appearance.  HENT:     Head: Normocephalic and atraumatic.     Right Ear: Tympanic membrane, ear canal and external ear normal. There is no impacted cerumen.     Left Ear: Tympanic membrane, ear canal and external ear normal. There is no impacted cerumen.     Nose: Nose normal.     Mouth/Throat:     Pharynx: Oropharynx is  clear.  Eyes:     Conjunctiva/sclera: Conjunctivae normal.  Cardiovascular:     Rate and Rhythm: Normal rate and regular rhythm.  Pulmonary:     Effort: Pulmonary effort is normal.     Breath sounds: Normal breath sounds.  Musculoskeletal:     Cervical back: Neck supple. No tenderness.  Lymphadenopathy:     Cervical: No cervical adenopathy.  Skin:    General: Skin is warm and dry.  Neurological:     Mental Status: She is alert and oriented to person, place, and time.  Psychiatric:        Mood and Affect: Mood normal.       Results for orders placed or performed in visit on 05/02/24  POC SOFIA 2 FLU + SARS ANTIGEN FIA  Result Value Ref Range   Influenza A, POC Negative Negative   Influenza B, POC Negative Negative   SARS Coronavirus 2 Ag Negative Negative       Assessment & Plan:   Problem List Items Addressed This Visit       Musculoskeletal and Integument   Rash   Relevant Medications   nystatin  (MYCOSTATIN /NYSTOP ) powder   Other Visit Diagnoses       Cough, unspecified type    -  Primary   Relevant Orders   POC SOFIA 2 FLU + SARS ANTIGEN FIA (  Completed)     Acute non-recurrent frontal sinusitis       Relevant Medications   amoxicillin -clavulanate (AUGMENTIN ) 875-125 MG tablet       Assessment and Plan Assessment & Plan Acute frontal sinusitis Symptoms persistent for over a week. Negative for pneumonia, flu, and COVID. Treated with antibiotics due to lack of improvement. - Prescribed Augmentin  for acute sinusitis. - Continue symptomatic care. - Advised to report if not improving over the next week or if new or worsening symptoms develop.  Intertrigo with possible candidal involvement Itchy rash under breasts, possibly yeast infection. Unusual presentation for time of year. - Prescribed nystatin  powder for intertrigo. - Advised to apply powder to affected area to absorb moisture. - Instructed to report if not improving after a couple of  weeks.    Meds ordered this encounter  Medications   amoxicillin -clavulanate (AUGMENTIN ) 875-125 MG tablet    Sig: Take 1 tablet by mouth 2 (two) times daily.    Dispense:  14 tablet    Refill:  0   nystatin  (MYCOSTATIN /NYSTOP ) powder    Sig: Apply 1 Application topically 3 (three) times daily. X 7 days and PRN    Dispense:  60 g    Refill:  2    No follow-ups on file.  Dorothyann Byars, MD [facility] at [facility]   "

## 2024-05-22 ENCOUNTER — Encounter: Admitting: Family Medicine

## 2024-05-23 ENCOUNTER — Ambulatory Visit

## 2024-06-24 ENCOUNTER — Encounter: Admitting: Family Medicine
